# Patient Record
Sex: Female | Born: 1945 | Race: White | Hispanic: No | Smoking: Never smoker
Health system: Southern US, Community
[De-identification: ages and names within clinical notes are randomized; demographics above are authoritative.]

## PROBLEM LIST (undated history)

## (undated) DIAGNOSIS — Z974 Presence of external hearing-aid: Secondary | ICD-10-CM

## (undated) DIAGNOSIS — K2970 Gastritis, unspecified, without bleeding: Secondary | ICD-10-CM

## (undated) DIAGNOSIS — I639 Cerebral infarction, unspecified: Secondary | ICD-10-CM

## (undated) DIAGNOSIS — N183 Chronic kidney disease, stage 3 unspecified: Secondary | ICD-10-CM

## (undated) DIAGNOSIS — K219 Gastro-esophageal reflux disease without esophagitis: Secondary | ICD-10-CM

## (undated) DIAGNOSIS — L4050 Arthropathic psoriasis, unspecified: Secondary | ICD-10-CM

## (undated) DIAGNOSIS — M797 Fibromyalgia: Secondary | ICD-10-CM

## (undated) DIAGNOSIS — Z8489 Family history of other specified conditions: Secondary | ICD-10-CM

## (undated) DIAGNOSIS — Z800 Family history of malignant neoplasm of digestive organs: Secondary | ICD-10-CM

## (undated) DIAGNOSIS — R0600 Dyspnea, unspecified: Secondary | ICD-10-CM

## (undated) DIAGNOSIS — N870 Mild cervical dysplasia: Secondary | ICD-10-CM

## (undated) DIAGNOSIS — E039 Hypothyroidism, unspecified: Secondary | ICD-10-CM

## (undated) DIAGNOSIS — D131 Benign neoplasm of stomach: Secondary | ICD-10-CM

## (undated) DIAGNOSIS — K589 Irritable bowel syndrome without diarrhea: Secondary | ICD-10-CM

## (undated) DIAGNOSIS — G629 Polyneuropathy, unspecified: Secondary | ICD-10-CM

## (undated) DIAGNOSIS — G5600 Carpal tunnel syndrome, unspecified upper limb: Secondary | ICD-10-CM

## (undated) DIAGNOSIS — K2990 Gastroduodenitis, unspecified, without bleeding: Secondary | ICD-10-CM

## (undated) DIAGNOSIS — G4730 Sleep apnea, unspecified: Secondary | ICD-10-CM

## (undated) DIAGNOSIS — B3781 Candidal esophagitis: Secondary | ICD-10-CM

## (undated) DIAGNOSIS — K760 Fatty (change of) liver, not elsewhere classified: Secondary | ICD-10-CM

## (undated) DIAGNOSIS — N736 Female pelvic peritoneal adhesions (postinfective): Secondary | ICD-10-CM

## (undated) DIAGNOSIS — B009 Herpesviral infection, unspecified: Secondary | ICD-10-CM

## (undated) DIAGNOSIS — I10 Essential (primary) hypertension: Secondary | ICD-10-CM

## (undated) HISTORY — DX: Fatty (change of) liver, not elsewhere classified: K76.0

## (undated) HISTORY — DX: Carpal tunnel syndrome, unspecified upper limb: G56.00

## (undated) HISTORY — DX: Hemochromatosis, unspecified: E83.119

## (undated) HISTORY — DX: Irritable bowel syndrome, unspecified: K58.9

## (undated) HISTORY — DX: Polyneuropathy, unspecified: G62.9

## (undated) HISTORY — PX: APPENDECTOMY: SHX54

## (undated) HISTORY — PX: BACK SURGERY: SHX140

## (undated) HISTORY — PX: EYE SURGERY: SHX253

## (undated) HISTORY — DX: Fibromyalgia: M79.7

## (undated) HISTORY — PX: PELVIC LAPAROSCOPY: SHX162

## (undated) HISTORY — DX: Female pelvic peritoneal adhesions (postinfective): N73.6

## (undated) HISTORY — DX: Family history of malignant neoplasm of digestive organs: Z80.0

## (undated) HISTORY — DX: Sleep apnea, unspecified: G47.30

## (undated) HISTORY — PX: OTHER SURGICAL HISTORY: SHX169

## (undated) HISTORY — DX: Benign neoplasm of stomach: D13.1

## (undated) HISTORY — DX: Gastritis, unspecified, without bleeding: K29.70

## (undated) HISTORY — PX: FOOT SURGERY: SHX648

## (undated) HISTORY — PX: TUBAL LIGATION: SHX77

## (undated) HISTORY — DX: Essential (primary) hypertension: I10

## (undated) HISTORY — DX: Candidal esophagitis: B37.81

## (undated) HISTORY — DX: Hypothyroidism, unspecified: E03.9

## (undated) HISTORY — PX: CERVICAL FUSION: SHX112

## (undated) HISTORY — DX: Arthropathic psoriasis, unspecified: L40.50

## (undated) HISTORY — DX: Herpesviral infection, unspecified: B00.9

## (undated) HISTORY — DX: Gastroduodenitis, unspecified, without bleeding: K29.90

## (undated) HISTORY — DX: Mild cervical dysplasia: N87.0

## (undated) HISTORY — DX: Gastro-esophageal reflux disease without esophagitis: K21.9

---

## 1996-05-22 DIAGNOSIS — D320 Benign neoplasm of cerebral meninges: Secondary | ICD-10-CM | POA: Insufficient documentation

## 1998-01-27 ENCOUNTER — Other Ambulatory Visit: Admission: RE | Admit: 1998-01-27 | Discharge: 1998-01-27 | Payer: Self-pay | Admitting: Obstetrics and Gynecology

## 1998-07-18 ENCOUNTER — Other Ambulatory Visit: Admission: RE | Admit: 1998-07-18 | Discharge: 1998-07-18 | Payer: Self-pay | Admitting: Obstetrics and Gynecology

## 1998-11-20 ENCOUNTER — Ambulatory Visit (HOSPITAL_COMMUNITY): Admission: RE | Admit: 1998-11-20 | Discharge: 1998-11-20 | Payer: Self-pay | Admitting: Hematology & Oncology

## 1998-11-20 ENCOUNTER — Encounter: Payer: Self-pay | Admitting: Hematology & Oncology

## 1999-05-28 ENCOUNTER — Other Ambulatory Visit: Admission: RE | Admit: 1999-05-28 | Discharge: 1999-05-28 | Payer: Self-pay | Admitting: Obstetrics and Gynecology

## 1999-11-23 ENCOUNTER — Encounter: Payer: Self-pay | Admitting: Oncology

## 1999-11-23 ENCOUNTER — Encounter: Admission: RE | Admit: 1999-11-23 | Discharge: 1999-11-23 | Payer: Self-pay | Admitting: Oncology

## 2000-06-23 ENCOUNTER — Encounter: Admission: RE | Admit: 2000-06-23 | Discharge: 2000-06-23 | Payer: Self-pay | Admitting: Obstetrics and Gynecology

## 2000-06-23 ENCOUNTER — Encounter: Payer: Self-pay | Admitting: Obstetrics and Gynecology

## 2000-07-06 ENCOUNTER — Other Ambulatory Visit: Admission: RE | Admit: 2000-07-06 | Discharge: 2000-07-06 | Payer: Self-pay | Admitting: Obstetrics and Gynecology

## 2001-06-29 ENCOUNTER — Encounter: Payer: Self-pay | Admitting: Obstetrics and Gynecology

## 2001-06-29 ENCOUNTER — Encounter: Admission: RE | Admit: 2001-06-29 | Discharge: 2001-06-29 | Payer: Self-pay | Admitting: Obstetrics and Gynecology

## 2001-08-09 ENCOUNTER — Other Ambulatory Visit: Admission: RE | Admit: 2001-08-09 | Discharge: 2001-08-09 | Payer: Self-pay | Admitting: Obstetrics and Gynecology

## 2001-10-26 ENCOUNTER — Encounter (INDEPENDENT_AMBULATORY_CARE_PROVIDER_SITE_OTHER): Payer: Self-pay

## 2001-10-26 ENCOUNTER — Ambulatory Visit (HOSPITAL_COMMUNITY): Admission: RE | Admit: 2001-10-26 | Discharge: 2001-10-26 | Payer: Self-pay | Admitting: Obstetrics and Gynecology

## 2002-01-10 ENCOUNTER — Encounter: Admission: RE | Admit: 2002-01-10 | Discharge: 2002-02-09 | Payer: Self-pay | Admitting: Neurology

## 2002-07-24 ENCOUNTER — Encounter: Payer: Self-pay | Admitting: Obstetrics and Gynecology

## 2002-07-24 ENCOUNTER — Encounter: Admission: RE | Admit: 2002-07-24 | Discharge: 2002-07-24 | Payer: Self-pay | Admitting: Obstetrics and Gynecology

## 2002-08-09 ENCOUNTER — Other Ambulatory Visit: Admission: RE | Admit: 2002-08-09 | Discharge: 2002-08-09 | Payer: Self-pay | Admitting: Obstetrics and Gynecology

## 2002-12-31 ENCOUNTER — Encounter: Payer: Self-pay | Admitting: Gastroenterology

## 2003-07-26 ENCOUNTER — Encounter: Admission: RE | Admit: 2003-07-26 | Discharge: 2003-07-26 | Payer: Self-pay | Admitting: Obstetrics and Gynecology

## 2003-07-26 ENCOUNTER — Encounter: Payer: Self-pay | Admitting: Obstetrics and Gynecology

## 2003-09-03 ENCOUNTER — Other Ambulatory Visit: Admission: RE | Admit: 2003-09-03 | Discharge: 2003-09-03 | Payer: Self-pay | Admitting: Obstetrics and Gynecology

## 2003-11-11 ENCOUNTER — Encounter: Admission: RE | Admit: 2003-11-11 | Discharge: 2004-01-23 | Payer: Self-pay | Admitting: Neurology

## 2004-07-27 ENCOUNTER — Encounter: Admission: RE | Admit: 2004-07-27 | Discharge: 2004-07-27 | Payer: Self-pay | Admitting: Internal Medicine

## 2004-08-20 ENCOUNTER — Ambulatory Visit: Payer: Self-pay | Admitting: Hematology & Oncology

## 2004-09-09 ENCOUNTER — Other Ambulatory Visit: Admission: RE | Admit: 2004-09-09 | Discharge: 2004-09-09 | Payer: Self-pay | Admitting: Obstetrics and Gynecology

## 2004-10-21 ENCOUNTER — Ambulatory Visit: Payer: Self-pay | Admitting: Hematology & Oncology

## 2004-12-16 ENCOUNTER — Ambulatory Visit: Payer: Self-pay | Admitting: Hematology & Oncology

## 2005-04-12 ENCOUNTER — Ambulatory Visit: Payer: Self-pay | Admitting: Hematology & Oncology

## 2005-07-09 ENCOUNTER — Ambulatory Visit: Payer: Self-pay | Admitting: Hematology & Oncology

## 2005-07-28 ENCOUNTER — Encounter: Admission: RE | Admit: 2005-07-28 | Discharge: 2005-07-28 | Payer: Self-pay | Admitting: Obstetrics and Gynecology

## 2005-08-10 ENCOUNTER — Encounter: Admission: RE | Admit: 2005-08-10 | Discharge: 2005-08-10 | Payer: Self-pay | Admitting: Obstetrics and Gynecology

## 2005-09-10 ENCOUNTER — Other Ambulatory Visit: Admission: RE | Admit: 2005-09-10 | Discharge: 2005-09-10 | Payer: Self-pay | Admitting: Obstetrics and Gynecology

## 2005-10-08 ENCOUNTER — Ambulatory Visit: Payer: Self-pay | Admitting: Hematology & Oncology

## 2006-02-03 ENCOUNTER — Ambulatory Visit: Payer: Self-pay | Admitting: Hematology & Oncology

## 2006-02-07 LAB — CBC WITH DIFFERENTIAL/PLATELET
Basophils Absolute: 0 10*3/uL (ref 0.0–0.1)
Eosinophils Absolute: 0.1 10*3/uL (ref 0.0–0.5)
HGB: 14.5 g/dL (ref 11.6–15.9)
MCV: 100.2 fL (ref 81.0–101.0)
MONO#: 0.9 10*3/uL (ref 0.1–0.9)
NEUT#: 4.8 10*3/uL (ref 1.5–6.5)
Platelets: 382 10*3/uL (ref 145–400)
RBC: 4.2 10*6/uL (ref 3.70–5.32)
RDW: 12.8 % (ref 11.3–14.5)
WBC: 8.4 10*3/uL (ref 3.9–10.0)

## 2006-02-07 LAB — FERRITIN: Ferritin: 22 ng/mL (ref 10–291)

## 2006-05-02 ENCOUNTER — Ambulatory Visit (HOSPITAL_COMMUNITY): Admission: RE | Admit: 2006-05-02 | Discharge: 2006-05-02 | Payer: Self-pay | Admitting: Neurological Surgery

## 2006-05-18 ENCOUNTER — Ambulatory Visit (HOSPITAL_COMMUNITY): Admission: RE | Admit: 2006-05-18 | Discharge: 2006-05-18 | Payer: Self-pay | Admitting: Neurological Surgery

## 2006-06-03 ENCOUNTER — Ambulatory Visit: Payer: Self-pay | Admitting: Hematology & Oncology

## 2006-06-08 LAB — CBC WITH DIFFERENTIAL/PLATELET
Basophils Absolute: 0 10*3/uL (ref 0.0–0.1)
Eosinophils Absolute: 0.1 10*3/uL (ref 0.0–0.5)
HCT: 41.2 % (ref 34.8–46.6)
HGB: 14.5 g/dL (ref 11.6–15.9)
LYMPH%: 27.9 % (ref 14.0–48.0)
MCV: 98.1 fL (ref 81.0–101.0)
MONO%: 8.3 % (ref 0.0–13.0)
NEUT#: 3.5 10*3/uL (ref 1.5–6.5)
NEUT%: 61 % (ref 39.6–76.8)
Platelets: 322 10*3/uL (ref 145–400)
RDW: 12.5 % (ref 11.3–14.5)

## 2006-06-08 LAB — IRON AND TIBC
%SAT: 39 % (ref 20–55)
Iron: 131 ug/dL (ref 42–145)

## 2006-09-21 ENCOUNTER — Encounter: Admission: RE | Admit: 2006-09-21 | Discharge: 2006-09-21 | Payer: Self-pay | Admitting: Obstetrics and Gynecology

## 2006-09-29 ENCOUNTER — Ambulatory Visit: Payer: Self-pay | Admitting: Hematology & Oncology

## 2006-09-29 ENCOUNTER — Other Ambulatory Visit: Admission: RE | Admit: 2006-09-29 | Discharge: 2006-09-29 | Payer: Self-pay | Admitting: Obstetrics and Gynecology

## 2007-09-25 ENCOUNTER — Encounter: Admission: RE | Admit: 2007-09-25 | Discharge: 2007-09-25 | Payer: Self-pay | Admitting: Obstetrics and Gynecology

## 2007-10-03 ENCOUNTER — Other Ambulatory Visit: Admission: RE | Admit: 2007-10-03 | Discharge: 2007-10-03 | Payer: Self-pay | Admitting: Obstetrics and Gynecology

## 2007-11-08 ENCOUNTER — Ambulatory Visit: Payer: Self-pay | Admitting: Hematology & Oncology

## 2007-11-10 LAB — CBC WITH DIFFERENTIAL/PLATELET
Basophils Absolute: 0 10*3/uL (ref 0.0–0.1)
Eosinophils Absolute: 0.4 10*3/uL (ref 0.0–0.5)
HCT: 41.4 % (ref 34.8–46.6)
HGB: 14 g/dL (ref 11.6–15.9)
LYMPH%: 22.5 % (ref 14.0–48.0)
MONO#: 0.6 10*3/uL (ref 0.1–0.9)
NEUT%: 60.5 % (ref 39.6–76.8)
Platelets: 315 10*3/uL (ref 145–400)
WBC: 6.4 10*3/uL (ref 3.9–10.0)
lymph#: 1.4 10*3/uL (ref 0.9–3.3)

## 2008-01-19 DIAGNOSIS — K3184 Gastroparesis: Secondary | ICD-10-CM

## 2008-01-19 DIAGNOSIS — F329 Major depressive disorder, single episode, unspecified: Secondary | ICD-10-CM

## 2008-01-23 ENCOUNTER — Ambulatory Visit: Payer: Self-pay | Admitting: Gastroenterology

## 2008-01-24 ENCOUNTER — Ambulatory Visit: Payer: Self-pay | Admitting: Gastroenterology

## 2008-01-24 ENCOUNTER — Encounter: Payer: Self-pay | Admitting: Gastroenterology

## 2008-01-24 DIAGNOSIS — K2970 Gastritis, unspecified, without bleeding: Secondary | ICD-10-CM | POA: Insufficient documentation

## 2008-01-24 DIAGNOSIS — K2990 Gastroduodenitis, unspecified, without bleeding: Secondary | ICD-10-CM

## 2008-01-24 DIAGNOSIS — K589 Irritable bowel syndrome without diarrhea: Secondary | ICD-10-CM | POA: Insufficient documentation

## 2008-02-07 ENCOUNTER — Ambulatory Visit: Payer: Self-pay | Admitting: Hematology & Oncology

## 2008-02-22 DIAGNOSIS — N3010 Interstitial cystitis (chronic) without hematuria: Secondary | ICD-10-CM

## 2008-02-22 DIAGNOSIS — R198 Other specified symptoms and signs involving the digestive system and abdomen: Secondary | ICD-10-CM

## 2008-02-22 DIAGNOSIS — G609 Hereditary and idiopathic neuropathy, unspecified: Secondary | ICD-10-CM | POA: Insufficient documentation

## 2008-02-22 DIAGNOSIS — I10 Essential (primary) hypertension: Secondary | ICD-10-CM | POA: Insufficient documentation

## 2008-02-22 DIAGNOSIS — G4730 Sleep apnea, unspecified: Secondary | ICD-10-CM | POA: Insufficient documentation

## 2008-02-22 DIAGNOSIS — M5030 Other cervical disc degeneration, unspecified cervical region: Secondary | ICD-10-CM

## 2008-02-22 DIAGNOSIS — M5126 Other intervertebral disc displacement, lumbar region: Secondary | ICD-10-CM

## 2008-02-22 DIAGNOSIS — M1990 Unspecified osteoarthritis, unspecified site: Secondary | ICD-10-CM | POA: Insufficient documentation

## 2008-02-22 DIAGNOSIS — K219 Gastro-esophageal reflux disease without esophagitis: Secondary | ICD-10-CM

## 2008-02-22 DIAGNOSIS — IMO0001 Reserved for inherently not codable concepts without codable children: Secondary | ICD-10-CM

## 2008-02-22 DIAGNOSIS — N281 Cyst of kidney, acquired: Secondary | ICD-10-CM | POA: Insufficient documentation

## 2008-02-22 DIAGNOSIS — K7689 Other specified diseases of liver: Secondary | ICD-10-CM | POA: Insufficient documentation

## 2008-02-22 DIAGNOSIS — N390 Urinary tract infection, site not specified: Secondary | ICD-10-CM

## 2008-02-27 ENCOUNTER — Ambulatory Visit: Payer: Self-pay | Admitting: Gastroenterology

## 2008-03-06 ENCOUNTER — Telehealth: Payer: Self-pay | Admitting: Gastroenterology

## 2008-03-07 ENCOUNTER — Encounter: Payer: Self-pay | Admitting: Gastroenterology

## 2008-05-30 ENCOUNTER — Telehealth: Payer: Self-pay | Admitting: Gastroenterology

## 2008-09-25 ENCOUNTER — Encounter: Admission: RE | Admit: 2008-09-25 | Discharge: 2008-09-25 | Payer: Self-pay | Admitting: Obstetrics and Gynecology

## 2008-10-03 ENCOUNTER — Ambulatory Visit: Payer: Self-pay | Admitting: Obstetrics and Gynecology

## 2008-10-03 ENCOUNTER — Encounter: Payer: Self-pay | Admitting: Obstetrics and Gynecology

## 2008-10-03 ENCOUNTER — Other Ambulatory Visit: Admission: RE | Admit: 2008-10-03 | Discharge: 2008-10-03 | Payer: Self-pay | Admitting: Obstetrics and Gynecology

## 2009-07-09 ENCOUNTER — Telehealth (INDEPENDENT_AMBULATORY_CARE_PROVIDER_SITE_OTHER): Payer: Self-pay | Admitting: *Deleted

## 2009-10-04 DIAGNOSIS — B3781 Candidal esophagitis: Secondary | ICD-10-CM

## 2009-10-04 HISTORY — DX: Candidal esophagitis: B37.81

## 2009-10-15 ENCOUNTER — Encounter: Admission: RE | Admit: 2009-10-15 | Discharge: 2009-10-15 | Payer: Self-pay | Admitting: Obstetrics and Gynecology

## 2009-10-22 ENCOUNTER — Other Ambulatory Visit: Admission: RE | Admit: 2009-10-22 | Discharge: 2009-10-22 | Payer: Self-pay | Admitting: Obstetrics and Gynecology

## 2009-10-22 ENCOUNTER — Ambulatory Visit: Payer: Self-pay | Admitting: Obstetrics and Gynecology

## 2010-01-09 ENCOUNTER — Ambulatory Visit: Payer: Self-pay | Admitting: Vascular Surgery

## 2010-03-13 ENCOUNTER — Ambulatory Visit: Payer: Self-pay | Admitting: Obstetrics and Gynecology

## 2010-04-30 ENCOUNTER — Ambulatory Visit: Payer: Self-pay | Admitting: Obstetrics and Gynecology

## 2010-06-04 ENCOUNTER — Telehealth: Payer: Self-pay | Admitting: Gastroenterology

## 2010-06-23 ENCOUNTER — Ambulatory Visit: Payer: Self-pay | Admitting: Gastroenterology

## 2010-06-23 ENCOUNTER — Ambulatory Visit: Payer: Self-pay | Admitting: Obstetrics and Gynecology

## 2010-06-23 DIAGNOSIS — R197 Diarrhea, unspecified: Secondary | ICD-10-CM | POA: Insufficient documentation

## 2010-06-23 DIAGNOSIS — R160 Hepatomegaly, not elsewhere classified: Secondary | ICD-10-CM | POA: Insufficient documentation

## 2010-06-23 LAB — CONVERTED CEMR LAB
Albumin ELP: 57.8 % (ref 55.8–66.1)
Alpha-1-Globulin: 5.5 % — ABNORMAL HIGH (ref 2.9–4.9)
Alpha-2-Globulin: 14 % — ABNORMAL HIGH (ref 7.1–11.8)
Beta Globulin: 6.5 % (ref 4.7–7.2)
Gamma Globulin: 13.1 % (ref 11.1–18.8)
Total Protein, Serum Electrophoresis: 6.6 g/dL (ref 6.0–8.3)

## 2010-06-29 LAB — CONVERTED CEMR LAB
ALT: 34 units/L (ref 0–35)
AST: 48 units/L — ABNORMAL HIGH (ref 0–37)
Albumin: 3.8 g/dL (ref 3.5–5.2)
Alkaline Phosphatase: 88 units/L (ref 39–117)
BUN: 21 mg/dL (ref 6–23)
Basophils Relative: 0.3 % (ref 0.0–3.0)
Bilirubin, Direct: 0.2 mg/dL (ref 0.0–0.3)
CO2: 27 meq/L (ref 19–32)
Calcium: 9.5 mg/dL (ref 8.4–10.5)
Chloride: 99 meq/L (ref 96–112)
Creatinine, Ser: 1 mg/dL (ref 0.4–1.2)
Eosinophils Relative: 3.6 % (ref 0.0–5.0)
Ferritin: 40 ng/mL (ref 10.0–291.0)
Folate: >20 ng/mL
GFR calc non Af Amer: 58.07 mL/min (ref >60)
Glucose, Bld: 328 mg/dL — ABNORMAL HIGH (ref 70–99)
HCT: 40.8 % (ref 36.0–46.0)
Hemoglobin: 13.9 g/dL (ref 12.0–15.0)
Iron: 82 ug/dL (ref 42–145)
Lymphocytes Relative: 22.1 % (ref 12.0–46.0)
MCHC: 34.2 g/dL (ref 30.0–36.0)
MCV: 100.8 fL — ABNORMAL HIGH (ref 78.0–100.0)
Monocytes Relative: 9.3 % (ref 3.0–12.0)
Neutrophils Relative %: 64.7 % (ref 43.0–77.0)
Platelets: 279 10*3/uL (ref 150.0–400.0)
Potassium: 4 meq/L (ref 3.5–5.1)
RBC: 4.04 M/uL (ref 3.87–5.11)
RDW: 12.3 % (ref 11.5–14.6)
Saturation Ratios: 24 % (ref 20.0–50.0)
Sed Rate: 29 mm/hr — ABNORMAL HIGH (ref 0–22)
Sodium: 140 meq/L (ref 135–145)
TSH: 3.11 microintl units/mL (ref 0.35–5.50)
Total Bilirubin: 0.9 mg/dL (ref 0.3–1.2)
Total Protein: 6.6 g/dL (ref 6.0–8.3)
Transferrin: 243.7 mg/dL (ref 212.0–360.0)
Vitamin B-12: 364 pg/mL (ref 211–911)
WBC: 5 10*3/uL (ref 4.5–10.5)

## 2010-09-04 ENCOUNTER — Telehealth: Payer: Self-pay | Admitting: Gastroenterology

## 2010-09-08 ENCOUNTER — Ambulatory Visit: Payer: Self-pay | Admitting: Gastroenterology

## 2010-09-08 ENCOUNTER — Telehealth: Payer: Self-pay | Admitting: Gastroenterology

## 2010-09-08 ENCOUNTER — Encounter (INDEPENDENT_AMBULATORY_CARE_PROVIDER_SITE_OTHER): Payer: Self-pay | Admitting: *Deleted

## 2010-09-08 DIAGNOSIS — R110 Nausea: Secondary | ICD-10-CM

## 2010-09-08 DIAGNOSIS — R1013 Epigastric pain: Secondary | ICD-10-CM

## 2010-09-08 LAB — CONVERTED CEMR LAB
ALT: 30 units/L (ref 0–35)
AST: 39 units/L — ABNORMAL HIGH (ref 0–37)
Albumin: 4.1 g/dL (ref 3.5–5.2)
Alkaline Phosphatase: 110 units/L (ref 39–117)
Amylase: 24 units/L — ABNORMAL LOW (ref 27–131)
Bilirubin, Direct: 0.2 mg/dL (ref 0.0–0.3)
CRP, High Sensitivity: 5.14 — ABNORMAL HIGH (ref 0.00–5.00)
Lipase: 19 units/L (ref 11.0–59.0)
Sed Rate: 16 mm/hr (ref 0–22)
Total Bilirubin: 1.4 mg/dL — ABNORMAL HIGH (ref 0.3–1.2)
Total Protein: 7.1 g/dL (ref 6.0–8.3)

## 2010-09-09 ENCOUNTER — Ambulatory Visit: Payer: Self-pay | Admitting: Gastroenterology

## 2010-09-11 ENCOUNTER — Encounter: Payer: Self-pay | Admitting: Gastroenterology

## 2010-09-25 ENCOUNTER — Ambulatory Visit: Payer: Self-pay | Admitting: Gastroenterology

## 2010-09-25 ENCOUNTER — Encounter: Payer: Self-pay | Admitting: Gastroenterology

## 2010-09-25 DIAGNOSIS — R7309 Other abnormal glucose: Secondary | ICD-10-CM | POA: Insufficient documentation

## 2010-09-25 LAB — CONVERTED CEMR LAB
ALT: 47 units/L — ABNORMAL HIGH (ref 0–35)
AST: 57 units/L — ABNORMAL HIGH (ref 0–37)
Albumin: 4 g/dL (ref 3.5–5.2)
Alkaline Phosphatase: 85 units/L (ref 39–117)
Amylase: 26 units/L — ABNORMAL LOW (ref 27–131)
BUN: 12 mg/dL (ref 6–23)
Basophils Absolute: 0 10*3/uL (ref 0.0–0.1)
Basophils Relative: 0.6 % (ref 0.0–3.0)
Bilirubin, Direct: 0.1 mg/dL (ref 0.0–0.3)
CO2: 29 meq/L (ref 19–32)
Calcium: 9.8 mg/dL (ref 8.4–10.5)
Chloride: 100 meq/L (ref 96–112)
Creatinine, Ser: 0.9 mg/dL (ref 0.4–1.2)
Eosinophils Absolute: 0.2 10*3/uL (ref 0.0–0.7)
Eosinophils Relative: 3.6 % (ref 0.0–5.0)
Ferritin: 30.3 ng/mL (ref 10.0–291.0)
Folate: >20 ng/mL
GFR calc non Af Amer: 64.55 mL/min (ref >60.00)
Glucose, Bld: 172 mg/dL — ABNORMAL HIGH (ref 70–99)
HCT: 41.5 % (ref 36.0–46.0)
Hemoglobin: 14.3 g/dL (ref 12.0–15.0)
Hgb A1c MFr Bld: 8.3 % — ABNORMAL HIGH (ref 4.6–6.5)
Iron: 96 ug/dL (ref 42–145)
Lipase: 20 units/L (ref 11.0–59.0)
Lymphocytes Relative: 25 % (ref 12.0–46.0)
Lymphs Abs: 1.5 10*3/uL (ref 0.7–4.0)
MCHC: 34.5 g/dL (ref 30.0–36.0)
MCV: 98.2 fL (ref 78.0–100.0)
Magnesium: 1.9 mg/dL (ref 1.5–2.5)
Monocytes Absolute: 0.6 10*3/uL (ref 0.1–1.0)
Monocytes Relative: 9.2 % (ref 3.0–12.0)
Neutro Abs: 3.7 10*3/uL (ref 1.4–7.7)
Neutrophils Relative %: 61.6 % (ref 43.0–77.0)
Platelets: 319 10*3/uL (ref 150.0–400.0)
Potassium: 3.9 meq/L (ref 3.5–5.1)
RBC: 4.23 M/uL (ref 3.87–5.11)
RDW: 12.8 % (ref 11.5–14.6)
Saturation Ratios: 24.9 % (ref 20.0–50.0)
Sodium: 139 meq/L (ref 135–145)
TSH: 3.15 microintl units/mL (ref 0.35–5.50)
Total Bilirubin: 1 mg/dL (ref 0.3–1.2)
Total Protein: 6.5 g/dL (ref 6.0–8.3)
Transferrin: 275 mg/dL (ref 212.0–360.0)
Vit D, 25-Hydroxy: 41 ng/mL (ref 30–89)
Vitamin B-12: 467 pg/mL (ref 211–911)
WBC: 6 10*3/uL (ref 4.5–10.5)

## 2010-10-06 ENCOUNTER — Telehealth: Payer: Self-pay | Admitting: Gastroenterology

## 2010-10-19 ENCOUNTER — Encounter
Admission: RE | Admit: 2010-10-19 | Discharge: 2010-10-19 | Payer: Self-pay | Source: Home / Self Care | Attending: Obstetrics and Gynecology | Admitting: Obstetrics and Gynecology

## 2010-10-24 ENCOUNTER — Encounter: Payer: Self-pay | Admitting: Obstetrics and Gynecology

## 2010-10-27 ENCOUNTER — Ambulatory Visit
Admission: RE | Admit: 2010-10-27 | Discharge: 2010-10-27 | Payer: Self-pay | Source: Home / Self Care | Attending: Obstetrics and Gynecology | Admitting: Obstetrics and Gynecology

## 2010-11-03 NOTE — Letter (Signed)
Summary: EGD Instructions  [facility]  [address] [place], [place] [address]   Phone: [phone]  Fax: [phone]       [name]    Feb 05, 1946    MRN: [id number]       Procedure Day Dorna Bloom:  Wednesday 09/09/2010     Arrival Time: 8am     Procedure Time:9am     Location of Procedure:                    [facility] (4th Floor)    PREPARATION FOR ENDOSCOPY   On 09/09/2010 THE DAY OF THE PROCEDURE:  1.   No solid foods, milk or milk products are allowed after midnight the night before your procedure.  2.   Do not drink anything colored red or purple.  Avoid juices with pulp.  No orange juice.  3.  You may drink clear liquids until 7am, which is 2 hours before your procedure.                                                                                                CLEAR LIQUIDS INCLUDE: Water Jello Ice Popsicles Tea (sugar ok, no milk/cream) Powdered fruit flavored drinks Coffee (sugar ok, no milk/cream) Gatorade Juice: apple, white grape, white cranberry  Lemonade Clear bullion, consomm, broth Carbonated beverages (any kind) Strained chicken noodle soup Hard Candy   MEDICATION INSTRUCTIONS  Unless otherwise instructed, you should take regular prescription medications with a small sip of water as early as possible the morning of your procedure.                OTHER INSTRUCTIONS  You will need a responsible adult at least 64 years of age to accompany you and drive you home.   This person must remain in the waiting room during your procedure.  Wear loose fitting clothing that is easily removed.  Leave jewelry and other valuables at home.  However, you may wish to bring a book to read or an iPod/MP3 player to listen to music as you wait for your procedure to start.  Remove all body piercing jewelry and leave at home.  Total time from sign-in until discharge is approximately 2-3 hours.  You should go home  directly after your procedure and rest.  You can resume normal activities the day after your procedure.  The day of your procedure you should not:   Drive   Make legal decisions   Operate machinery   Drink alcohol   Return to work  You will receive specific instructions about eating, activities and medications before you leave.    The above instructions have been reviewed and explained to me by   _______________________    I fully understand and can verbalize these instructions _____________________________ Date _________

## 2010-11-03 NOTE — Progress Notes (Signed)
Summary: Doesnt feel any better  Phone Note Call from Patient Call back at [facility] Phone [phone]   Call For: Dr Jarold Motto Reason for Call: Talk to Nurse Summary of Call: Had medicines called in on friday but she doesnt seem to feel any better. Initial call taken by: Leanor Kail [facility],  September 08, 2010 9:05 AM  Follow-up for Phone Call        Spoke with patient, she states she is no better since starting the zofran and librax on Friday. She is still having nausea, diarrhea, and abdominal pain. Appt made with Dr. Jarold Motto for 09/08/10 at 2:30pm. Follow-up by: Selinda Michaels RN,  September 08, 2010 9:53 AM

## 2010-11-03 NOTE — Progress Notes (Signed)
Summary: Triage  Phone Note Call from Patient Call back at Home Phone [phone]   Caller: Patient Call For: Dr.Mayford Alberg Reason for Call: Talk to Nurse Summary of Call: pt is complaining of nausea, no vomiting, having stomach pain and stomach is tender to the touch, has had a lot of GI problems in the past and wants to speak with the nurse Initial call taken by: [place] Johnson,  September 04, 2010 12:43 PM  Follow-up for Phone Call        Patient calling to report nausea without vomiting since Tuesday. Diarrhea twice yesterday but none today. Stomach "sore to touch all over above and below navel." Stomach hurts when walking. Denies fever. Cannot eat only had toast this AM. Drinking water. Taking Nexium two times a day on Wednesday and Thursday. Does not having anything  to take for nausea. Spoke with Dr. Jarold Motto re: patient. Per Dr. Jarold Motto Zofran 4 mg tab one by mouth every 6-8 hours as needed. Librax 2.5 mg by mouth three times a day before meals. Patient notified. Rx sent to patient's pharmacy. Follow-up by: Jesse Fall RN,  September 04, 2010 3:38 PM    New/Updated Medications: ZOFRAN 4 MG TABS (ONDANSETRON HCL) Take one tablet by mouth every 6-8 hours as needed nausea LIBRAX 2.5-5 MG CAPS (CLIDINIUM-CHLORDIAZEPOXIDE) Take one by mouth three times a day before meals Prescriptions: LIBRAX 2.5-5 MG CAPS (CLIDINIUM-CHLORDIAZEPOXIDE) Take one by mouth three times a day before meals  #90 x 1   Entered by:   Jesse Fall RN   Authorized by:   Mardella Layman MD [facility]   Signed by:   Jesse Fall RN on 09/04/2010   Method used:   Electronically to        [facility]  [address] [phone][facility] (retail)       [address]       [place], [place]  [address]       Ph: [phone] or [phone]       Fax: [phone]   RxID:   [id number] ZOFRAN 4 MG TABS (ONDANSETRON HCL) Take one tablet by mouth every 6-8 hours as needed nausea  #20 x 0   Entered by:   Jesse Fall RN  Authorized by:   Mardella Layman MD [facility]   Signed by:   Jesse Fall RN on 09/04/2010   Method used:   Electronically to        [facility]  [address] [phone]* (retail)       [address]       [place], [place]  [address]       Ph: [phone] or [phone]       Fax: [phone]   RxID:   [id number]

## 2010-11-03 NOTE — Progress Notes (Signed)
Summary: Refill and Appt  Phone Note From Pharmacy   Summary of Call: Refill requested for Nexium 40 mg .  Send to [facility].  Pt also needs appt.   Appt sch and pt notified. Initial call taken by: Ashok Cordia RN,  June 04, 2010 2:06 PM  Follow-up for Phone Call        Rx sent as requested Follow-up by: Ashok Cordia RN,  June 04, 2010 2:07 PM    Prescriptions: NEXIUM 40 MG  CPDR (ESOMEPRAZOLE MAGNESIUM) 1 capsule each day 30 minutes before meal  #90 x 3   Entered by:   Ashok Cordia RN   Authorized by:   Mardella Layman MD [facility]   Signed by:   Ashok Cordia RN on 06/04/2010   Method used:   Electronically to        [facility]* (mail-order)       [address] [place], [place]  [address]       Ph: [phone]       Fax: [phone]   RxID:   [id number]

## 2010-11-03 NOTE — Miscellaneous (Signed)
Summary: Diflucan  Clinical Lists Changes  Medications: Added new medication of DIFLUCAN 100 MG TABS (FLUCONAZOLE) take two tabs by mouth one day one and then one by mouth once daily for 2 weeks. - Signed Rx of DIFLUCAN 100 MG TABS (FLUCONAZOLE) take two tabs by mouth one day one and then one by mouth once daily for 2 weeks.;  #16 x 0;  Signed;  Entered by: Harlow Mares CMA (AAMA);  Authorized by: Mardella Layman MD [facility];  Method used: Electronically to [facility]  [address] [phone]*, [address] [place], [place], [place]  [address], Ph: [phone] or [phone], Fax: [phone]    Prescriptions: DIFLUCAN 100 MG TABS (FLUCONAZOLE) take two tabs by mouth one day one and then one by mouth once daily for 2 weeks.  #16 x 0   Entered by:   Harlow Mares CMA (AAMA)   Authorized by:   Mardella Layman MD [facility]   Signed by:   Harlow Mares CMA (AAMA) on 09/09/2010   Method used:   [facility]ronically to        [facility]  [address] [phone][facility] (retail)       [address]       [place], [place]  [address]       Ph: [phone] or [phone]       Fax: [phone]   RxID:   [id number]

## 2010-11-03 NOTE — Assessment & Plan Note (Signed)
Summary: Recheck/dfs   History of Present Illness Visit Type: Follow-up Visit Primary GI MD: Sheryn Bison MD FACP FAGA Primary Provider: Geoffry Paradise, M.D. Chief Complaint: Nexium refills History of Present Illness:   Extremely Complicated 65 year old white female with chronic diarrhea predominant IBS, chronic GERD, peripheral neuropathy, cervical spine disease, chronic pain syndrome, and supposedly hemochromatosis. She also has chronic anxiety syndrome which is managed by BuSpar and Cymbalta.  I have seen her for many years because of stable GERD treated with Nexium 40 mg a day. She's had extensive GI evaluations for her diarrhea which have been negative except for IBS and fatty liver. Her main complaints currently are peripheral neuropathy related to multiple neurosurgical problems including previous meningioma. She currently is on a variety of medications for pain control including Lyrica, Cymbalta, hydrocodone, Celebrex, and mlamotrigine.  She denies dysphasia, anorexia, weight loss, nausea and vomiting. She is not diabetic and denies chronic cardiovascular problems. Other diagnoses include fibromyalgia, family history colon cancer, previous hysterectomy and cholecystectomy.   GI Review of Systems      Denies abdominal pain, acid reflux, belching, bloating, chest pain, dysphagia with liquids, dysphagia with solids, heartburn, loss of appetite, nausea, vomiting, vomiting blood, weight loss, and  weight gain.      Reports diarrhea.     Denies anal fissure, black tarry stools, change in bowel habit, constipation, diverticulosis, fecal incontinence, heme positive stool, hemorrhoids, irritable bowel syndrome, jaundice, light color stool, liver problems, rectal bleeding, and  rectal pain. Preventive Screening-Counseling & Management      Drug Use:  no.      Current Medications (verified): 1)  Metoprolol-Hydrochlorothiazide 100-50 Mg  Tabs (Metoprolol-Hydrochlorothiazide) .... Once  Daily 2)  Cymbalta 30 Mg  Cpep (Duloxetine Hcl) .... Two Caps in Am and One Cap At At Bedtime 3)  Lyrica 225 Mg  Caps (Pregabalin) .... One Two Times A Day 4)  Valtrex 500 Mg  Tabs (Valacyclovir Hcl) .... Once Daily 5)  Caltrate 600+d 600-400 Mg-Unit  Tabs (Calcium Carbonate-Vitamin D) .... One Two Times A Day 6)  Multivitamins   Tabs (Multiple Vitamin) .... Once Daily 7)  Lastacaft 0.25 % Soln (Alcaftadine) .... Use As Directed 8)  Buspar 10 Mg  Tabs (Buspirone Hcl) .... Take Three Times A Day With Meals 9)  Nexium 40 Mg  Cpdr (Esomeprazole Magnesium) .Marland Kitchen.. 1 Capsule Each Day 30 Minutes Before Meal 10)  Hydrocodone-Acetaminophen 10-660 Mg Tabs (Hydrocodone-Acetaminophen) .... As Needed For Pain 11)  Celebrex 200 Mg Caps (Celecoxib) .... Take 1 Capsule By Mouth Two Times A Day 12)  Fexofenadine Hcl 180 Mg Tabs (Fexofenadine Hcl) .... Take 1 Tablet By Mouth Once Daily 13)  Furosemide 40 Mg Tabs (Furosemide) .... Take 1 Tablet By Mouth Once Daily 14)  Lamotrigine 100 Mg Tabs (Lamotrigine) .... Take 2 Tablets By Mouth Once Daily  Allergies (verified): No Known Drug Allergies  Past History:  Past medical, surgical, family and social histories (including risk factors) reviewed for relevance to current acute and chronic problems.  Past Medical History: Reviewed history from 02/22/2008 and no changes required. Current Problems:  UTI'S, RECURRENT (ICD-599.0) SLEEP APNEA (ICD-780.57) INTERSTITIAL CYSTITIS (ICD-595.1) HYPERTENSION (ICD-401.9) FIBROMYALGIA (ICD-729.1) DEGENERATIVE JOINT DISEASE (ICD-715.90) OSTEOPOROSIS (ICD-733.00) PERIPHERAL NEUROPATHY (ICD-356.9) HERNIATED LUMBOSACRAL DISC (ICD-722.10) DISC DISEASE, CERVICAL (ICD-722.4) GERD (ICD-530.81) RENAL CYST, LEFT (ICD-593.2) FATTY LIVER DISEASE (ICD-571.8) CARCINOMA, COLON, FAMILY HX (ICD-V16.0) EARLY SATIETY (ICD-789.9) HEMOCHROMATOSIS (ICD-275.0) GASTRITIS (ICD-535.50) IRRITABLE BOWEL SYNDROME (ICD-564.1) DEPRESSION  (ICD-311) GASTROPARESIS (ICD-536.3) MENINGIOMA (ICD-225.2)  Past Surgical History: Reviewed history from  02/22/2008 and no changes required. laminectomy c6-c7 resection of a meningioma Dr. Danielle Dess 05/23/1978 Cholecystectomy 1998 Hysterectomy metallic plates in neck Appendectomy cervical spine surgery X2  Family History: Reviewed history from 02/27/2008 and no changes required. Family History of Diabetes: sister Family History of Heart Disease: mother,grandmother,sister melanoma  sister Family History of Colon Cancer:mother  died of oral cancer Lung cancer-father died  Social History: Reviewed history from 02/27/2008 and no changes required. Patient has never smoked.  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  The patient denies anemia, anxiety-new, blood in urine, breast changes/lumps, confusion, cough, coughing up blood, fainting, fever, headaches-new, hearing problems, heart murmur, heart rhythm changes, itching, menstrual pain, nosebleeds, pregnancy symptoms, shortness of breath, skin rash, sore throat, swollen lymph glands, thirst - excessive , urination - excessive , urination changes/pain, urine leakage, vision changes, and voice change.    Vital Signs:  Patient profile:   65 year old female Height:      59 inches Weight:      209.25 pounds BMI:     42.42 Pulse rate:   64 / minute Pulse rhythm:   regular BP sitting:   104 / 68  (left arm) Cuff size:   large  Vitals Entered By: June McMurray CMA Duncan Dull) (June 23, 2010 3:26 PM)  Physical Exam  General:  Well developed, well nourished, no acute distress. Head:  Normocephalic and atraumatic. Eyes:  PERRLA, no icterus.exam deferred to patient's ophthalmologist.     Neck:  Supple; no masses or thyromegaly. Lungs:  Clear throughout to auscultation. Heart:  Regular rate and rhythm; no murmurs, rubs,  or bruits. Abdomen:  Somewhat enlarged liver with prominent left lobe and epigastric area. No definite splenomegaly, other abdominal masses or ascites noted. Rectal:  deferred. She is up-to-date on her colonoscopy exams. Msk:  arthritic changes.   Extremities:  No clubbing, cyanosis, edema or deformities noted.trace pedal edema.   Neurologic:  Alert and  oriented x4;  grossly normal neurologically. Cervical Nodes:  No significant cervical adenopathy. Psych:  Alert and cooperative. Normal mood and affect.   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 However, I suspect she is a heterozygote for hemochromatosis. I will repeat her liver enzymes, genetic markers for hemochromatosis, and other causes of metabolic liver disease. There is no evidence on exam of progressive hepatic failure.  Problem # 2:  DIARRHEA (ICD-787.91) Assessment: Unchanged Diarrhea predominant IBS with a family history of colon cancer. She denies excessive use of sorbitol, fructose, or any history of lactose intolerance. It is of note she is on chronic Nexium therapy. Orders: TLB-CBC Platelet - w/Differential (85025-CBCD) TLB-BMP (Basic Metabolic Panel-BMET) (80048-METABOL) TLB-Hepatic/Liver Function Pnl (80076-HEPATIC) TLB-TSH (Thyroid Stimulating Hormone) (84443-TSH) TLB-B12, Serum-Total ONLY (16109-U04) TLB-Ferritin (82728-FER) TLB-Folic Acid (Folate) (82746-FOL) TLB-IBC Pnl (Iron/FE;Transferrin) (83550-IBC) TLB-Sedimentation Rate (ESR) (85652-ESR) T-Hemochromatosis [phone]) T-Serum Protein Electrophoresis (62130-86578)  Problem # 3:  FIBROMYALGIA (ICD-729.1) Assessment: Unchanged continue multiple  medications per primary care. She does have a history of peripheral neuropathy of unexplained etiology. She is followed by Dr. Quentin Mulling in [facility] neurology.  Problem # 4:  GERD (ICD-530.81) Assessment: Improved continue anti-reflux regime and Nexium  40 mg a day. Cause of her chronic Nexium use I will order magnesium level and B12. Orders: TLB-CBC Platelet - w/Differential (85025-CBCD) TLB-BMP (Basic Metabolic Panel-BMET) (80048-METABOL) TLB-Hepatic/Liver Function Pnl (80076-HEPATIC) TLB-TSH (Thyroid Stimulating Hormone) (84443-TSH) TLB-B12, Serum-Total ONLY (09811-B14) TLB-Ferritin (82728-FER) TLB-Folic Acid (Folate) (82746-FOL) TLB-IBC Pnl (Iron/FE;Transferrin) (83550-IBC) TLB-Sedimentation Rate (ESR) (85652-ESR) T-Hemochromatosis [phone]) T-Serum Protein Electrophoresis ([id number])  Patient Instructions: 1)  Copy sent to : Geoffry Paradise, M.D. 2)  Please go to the basement today for your labs.  3)  Please continue current medications.  4)  Please schedule a follow-up appointment as needed.  5)  The medication list was reviewed and reconciled.  All changed / newly prescribed medications were explained.  A complete medication list was provided to the patient / caregiver. 6)  Avoid foods high in acid content ( tomatoes, citrus juices, spicy foods) . Avoid eating within 3 to 4 hours of lying down or before exercising. Do not over eat; try smaller more frequent meals. Elevate head of bed four inches when sleeping.  7)  Colonoscopy Followup As per Clinical Protocol

## 2010-11-03 NOTE — Assessment & Plan Note (Signed)
Summary: Nausea/abdominal pain/LRH   History of Present Illness Visit Type: Follow-up Visit Primary GI MD: Sheryn Bison MD FACP FAGA Primary Provider: Geoffry Paradise, MD Chief Complaint: Patient c/o 1week epigastric abdominal pain and severe nausea. She has also had diarrhea directly after eating. History of Present Illness:   Very Complicated 65 year old Caucasian female with multiple medical problems associated with severe cervical spondylitis with multiple surgical procedures to her neck and also removal of a meningioma. She has had previous cholecystectomy, hysterectomy, and appendectomy. She has a chronic abdominal pain syndrome related to GERD, IBS, and suspected intestinal adhesions.  The last 10 days she's had worsening epigastric abdominal pain with rather severe nausea refractory to Zofran and Librax. She is chronically on Nexium 40 mg a day. She repeatedly denies recent antibiotic exposure or prednisone therapy. There is no associated dysphagia or painful swallowing. Last endoscopic exam and colonoscopy was 2 years ago. She is on multiple medications including p.r.n. hydrocodone.  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 She has had previous phlebotomies but none in 4 years. There is some question as to whether or not she has hemochromatosis associated arthritis. Problems include fibromyalgia, interstitial cystitis, peripheral neuropathy, and chronic depression. She denies abuse of NSAIDs, cigarettes, or alcohol   GI Review of Systems    Reports abdominal pain, chest pain, loss of appetite, and  nausea.     Location of  Abdominal pain: epigastric area.    Denies acid reflux, belching, bloating, dysphagia with liquids, dysphagia with solids, heartburn, vomiting, vomiting blood, weight loss, and  weight gain.      Reports change in bowel habits and  diarrhea.     Denies anal fissure, black tarry stools,  constipation, diverticulosis, fecal incontinence, heme positive stool, hemorrhoids, irritable bowel syndrome, jaundice, light color stool, liver problems, rectal bleeding, and  rectal pain. Preventive Screening-Counseling & Management  Caffeine-Diet-Exercise     Does Patient Exercise: no    Current Medications (verified): 1)  Metoprolol-Hydrochlorothiazide 100-50 Mg  Tabs (Metoprolol-Hydrochlorothiazide) .... Take 1 Tablet By Mouth Once A Day 2)  Cymbalta 30 Mg  Cpep (Duloxetine Hcl) .... Two Caps in The Morning 3)  Valtrex 500 Mg  Tabs (Valacyclovir Hcl) .... Once Daily 4)  Caltrate 600+d 600-400 Mg-Unit  Tabs (Calcium Carbonate-Vitamin D) .... One Two Times A Day 5)  Multivitamins   Tabs (Multiple Vitamin) .... Once Daily 6)  Lastacaft 0.25 % Soln (Alcaftadine) .... Use As Directed 7)  Buspar 10 Mg  Tabs (Buspirone Hcl) .... Take Three Times A Day With Meals 8)  Nexium 40 Mg  Cpdr (Esomeprazole Magnesium) .Marland Kitchen.. 1 Capsule Each Day 30 Minutes Before Meal 9)  Hydrocodone-Acetaminophen 10-660 Mg Tabs (Hydrocodone-Acetaminophen) .... As Needed For Pain 10)  Celebrex 200 Mg Caps (Celecoxib) .... Take 1 Capsule By Mouth Two Times A Day 11)  Furosemide 40 Mg Tabs (Furosemide) .... Take 1 Tablet By Mouth Once Daily 12)  Lamotrigine 100 Mg Tabs (Lamotrigine) .... Take 2 Tablets By Mouth Once Daily 13)  Zofran 4 Mg Tabs (Ondansetron Hcl) .... Take One Tablet By Mouth Every 6-8 Hours As Needed Nausea 14)  Librax 2.5-5 Mg Caps (Clidinium-Chlordiazepoxide) .... Take One By Mouth Three Times A Day Before Meals (Patient Has Only Been Taking 1 Tab Daily)  Allergies (verified): No Known Drug Allergies  Past History:  Past medical, surgical, family and social histories (including risk factors) reviewed for relevance to current acute and chronic problems.  Past Medical History: Reviewed history from 02/22/2008 and no changes required. Current Problems:  UTI'S, RECURRENT (ICD-599.0) SLEEP APNEA  (ICD-780.57) INTERSTITIAL CYSTITIS (ICD-595.1) HYPERTENSION (ICD-401.9) FIBROMYALGIA (ICD-729.1) DEGENERATIVE JOINT DISEASE (ICD-715.90) OSTEOPOROSIS (ICD-733.00) PERIPHERAL NEUROPATHY (ICD-356.9) HERNIATED LUMBOSACRAL DISC (ICD-722.10) DISC DISEASE, CERVICAL (ICD-722.4) GERD (ICD-530.81) RENAL CYST, LEFT (ICD-593.2) FATTY LIVER DISEASE (ICD-571.8) CARCINOMA, COLON, FAMILY HX (ICD-V16.0) EARLY SATIETY (ICD-789.9) HEMOCHROMATOSIS (ICD-275.0) GASTRITIS (ICD-535.50) IRRITABLE BOWEL SYNDROME (ICD-564.1) DEPRESSION (ICD-311) GASTROPARESIS (ICD-536.3) MENINGIOMA (ICD-225.2)  Past Surgical History: laminectomy c6-c7 resection of a meningioma Dr. Danielle Dess 05/23/1978 Cholecystectomy 1998 Hysterectomy metallic plates in neck Appendectomy cervical spine surgery X2 Left foot surgery Sinus Surgery  Family History: Reviewed history from 02/27/2008 and no changes required. Family History of Diabetes: sister Family History of Heart Disease: mother,grandmother,sister melanoma  sister Family History of Colon Cancer:mother  Mother died of oral cancer Lung cancer-father died Family History of Kidney Disease: Sister Family History of Liver Disease: Sister Family History of Clotting disorder: Sister -hemochromatosis  Social History: Reviewed history from 06/23/2010 and no changes required. Patient has never smoked.  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  Does Patient Exercise:  no  Review of Systems       The patient complains of allergy/sinus, arthritis/joint pain, back pain, depression-new, fatigue, headaches-new, muscle pains/cramps, night sweats, nosebleeds, and thirst - excessive.  The patient denies anemia, anxiety-new, blood in urine, breast changes/lumps, change in vision, confusion, cough, coughing up blood, fainting, fever, hearing problems, heart murmur, heart rhythm changes, itching, menstrual pain, pregnancy  symptoms, shortness of breath, skin rash, sore throat, swelling of feet/legs, swollen lymph glands, thirst - excessive , urination - excessive , urination changes/pain, urine leakage, vision changes, and voice change.    Vital Signs:  Patient profile:   65 year old female Height:      68 inches Weight:      192.38 pounds BMI:     29.36 BSA:     2.01 Pulse rate:   72 / minute Pulse rhythm:   regular BP sitting:   124 / 74  (right arm)  Vitals Entered By: Lamona Curl CMA Duncan Dull) (September 08, 2010 2:31 PM)  Physical Exam  General:  Well developed, well nourished, no acute distress.Chronically Head:  Normocephalic and atraumatic. Eyes:  PERRLA, no icterus.exam deferred to patient's ophthalmologist.   Neck:  Supple; no masses or thyromegaly.Stiff neck related to previous surgical procedures. Lungs:  Clear throughout to auscultation. Heart:  Regular rate and rhythm; no murmurs, rubs,  or bruits. Abdomen:  Soft, nontender and nondistended. No masses, hepatosplenomegaly or hernias noted. Normal bowel sounds. Rectal:  Normal exam.hemocult negative.   Extremities:  No clubbing, cyanosis, edema or deformities noted. Neurologic:  Alert and  oriented x4;  grossly normal neurologically. Cervical Nodes:  No significant cervical adenopathy. Psych:  Alert and cooperative. Normal mood and affect.depressed affect.     Impression & Recommendations:  Problem # 1:  ABDOMINAL PAIN, EPIGASTRIC (ICD-789.06) Assessment Deteriorated Unusual acute onset abdominal pain with refractory nausea and middle-aged female on chronic Nexium therapy. Considerations would include H. pylori acute infection, Candida infection, vasculitis with associated pancreatitis, or viral syndrome with worsening gastroparesis. Screening labs are been ordered and I will schedule endoscopic upper GI exam. I placed her on Carafate suspension 1 tablespoon every 2-4 hours as needed pending further workup. Orders: TLB-Hepatic/Liver  Function Pnl (80076-HEPATIC) TLB-Amylase (82150-AMYL) TLB-Lipase (83690-LIPASE) TLB-Sedimentation Rate (ESR) (85652-ESR) TLB-CRP-High Sensitivity (C-Reactive Protein) (86140-FCRP) EGD (EGD)  Problem #  2:  NAUSEA (ICD-787.02) Assessment: Unchanged continue p.r.n. Zofran and Nexium. Orders: TLB-Hepatic/Liver Function Pnl (80076-HEPATIC) TLB-Amylase (82150-AMYL) TLB-Lipase (83690-LIPASE) TLB-Sedimentation Rate (ESR) (85652-ESR) TLB-CRP-High Sensitivity (C-Reactive Protein) (86140-FCRP) EGD (EGD)  Problem # 3:  PERIPHERAL NEUROPATHY (ICD-356.9) Assessment: Unchanged  Problem # 4:  DISC DISEASE, CERVICAL (ICD-722.4) Assessment: Deteriorated Apparently she is seeing a new neurosurgeon at [facility].  Problem # 5:  HEMOCHROMATOSIS (ICD-275.0) Assessment: Unchanged Serum ferritin level is 150 ng percent and iron saturation is at normal levels. I am surprised she has not required more frequent phlebotomies. There is no evidence on exam or lab testing of progressive cirrhosis.  Patient Instructions: 1)  Copy sent to : Geoffry Paradise, MD 2)  Your procedure has been scheduled for 09/10/2011, please follow the seperate instructions.  3)  [facility] Patient Information Guide given to patient.  4)  Upper Endoscopy brochure given.  5)  Your prescription(s) have been sent to you pharmacy.  6)  The medication list was reviewed and reconciled.  All changed / newly prescribed medications were explained.  A complete medication list was provided to the patient / caregiver. Prescriptions: CARAFATE 1 GM/10ML SUSP (SUCRALFATE) take one tablespoon every 4 hours as needed  #1230ml x 0   Entered by:   Harlow Mares CMA (AAMA)   Authorized by:   Mardella Layman MD [facility]   Signed by:   Harlow Mares CMA (AAMA) on 09/08/2010   Method used:   Electronically to        [facility]  [address] [phone]* (retail)       [address]       [place], [place]  [address]        Ph: [phone] or [phone]       Fax: [phone]   RxID:   [id number]

## 2010-11-05 NOTE — Progress Notes (Signed)
Summary: Medication  Phone Note Call from Patient Call back at Home Phone [phone]   Caller: Patient Call For: Dr. Jarold Motto Reason for Call: Talk to Nurse Summary of Call: Extreme nausea, abd pain.Marland KitchenMarland KitchenMeds does not seem to be working Initial call taken by: Karna Christmas,  October 06, 2010 8:53 AM  Follow-up for Phone Call        Patient of Dr Norval Gable called in to report she started nausea and abdominal pain about 3 days ago. Patient had EGD on 09/09/10 w/ Candida Esophagitis, gastic polyps, and a stricture- placed on Diflucan. On 09/25/10 came in for f/u and c/o nausea and abdominal pain after meals. Dr Laurell Roof suggested [employer], use Zofran as needed and Nexium as ordered and considered ordering domperidone. Patient reports she can't eat anything d/t the nausea and is mainly on liquids; she denies diarrhea and vomiting. Patient is taking Carafate and Librax but would like her Zofran renewed until we can talk to Dr Jarold Motto. Graciella Freer RN  October 06, 2010 10:30 AM   Ordered Zofran per Willette Cluster NP ; informed patient of script and that I will send this note to Dr Jarold Motto about the Domperidone. Patient stated understanding. Graciella Freer RN  October 06, 2010 1:58 PM   Additional Follow-up for Phone Call Additional follow up Details #1::        needs to see dr. Jacky Kindle per her diabetes and needs to send me his evaluation... Additional Follow-up by: Mardella Layman MD [name] 1:01 PM    Additional Follow-up for Phone Call Additional follow up Details #2::    Notified patient per Dr Jarold Motto, to keep her appointment w/ Dr Jacky Kindle tomorrow and have him send [place] his evaluation. Patient stated dhe was feeling better today with the Zofran. Follow-up by: Graciella Freer RN,  October 07, 2010 5:00 PM

## 2010-11-05 NOTE — Letter (Signed)
Summary: Patient [facility] Biopsy Results  [facility]  [address] [place], [place] [address]   Phone: [phone]  Fax: [phone]        September 11, 2010 MRN: [id number]    [facility] [address] [place], [place]  [address]    Dear [name],  I am pleased to inform you that the biopsies taken during your recent endoscopic examination did not show any evidence of cancer upon pathologic examination.  Additional information/recommendations:  __No further action is needed at this time.  Please follow-up with      your primary care physician for your other healthcare needs.  __ Please call [phone] to schedule a return visit to review      your condition.  x__ Continue with the treatment plan as outlined on the day of your      exam.  __ You should have a repeat endoscopic examination for this problem              in _ months/years.   Please call us if you are having persistent problems or have questions about your condition that have not been fully answered at this time.  Sincerely,  Mardella Layman MD [facility]  This letter has been electronically signed by your physician.  Appended Document: Patient Notice-Endo Biopsy Results Letter mailed

## 2010-11-05 NOTE — Assessment & Plan Note (Signed)
Summary: F/U FROM EGD.Marland KitchenJJ.   History of Present Illness Visit Type: Follow-up Visit Primary GI MD: Sheryn Bison MD FACP FAGA Primary Provider: Geoffry Paradise, MD Chief Complaint: Follow up from EGD, still c/o nausea and abdominal discomfort after meals History of Present Illness:   Very Complicated but very pleasant 65 year old Caucasian female with severe gait disturbance and genetic hemochromatosis but no phlebotomies in over 3 years. She previously was followed by Dr. Myna Hidalgo at the cancer center. She now has problems with gastroparesis, occasional dumping syndrome, vague abdominal discomfort, chronic acid reflux, and chronic nausea not responsive to Librax t.i.d., Carafate, and recent treatment for Candida esophagitis with Diflucan. Endoscopy was otherwise unremarkable with negative exam for H. pylori and negative small bowel biopsy for celiac disease. She is seeing Dr. Quentin Mulling in [facility] for ataxia and gait disturbance with frequent falls. Apparently head  CT scans have been unremarkable. She denies any history of alcohol abuse. She has several family members with hemochromatosis and associated neuropathy and cerebellar dysfunction. She does have diffuse arthritis and takes Celebrex 2 times a day.  Review of her labs shows a blood sugar of over 350 mg percent in September. Fingerstick blood sugar drops today is 200 mg percent. 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 She currently denies dysphagia, anorexia, weight loss, melena or hematochezia.   GI Review of Systems    Reports bloating and  nausea.      Denies abdominal pain, acid reflux, belching, chest pain, dysphagia with liquids, dysphagia with solids, heartburn, loss of appetite, vomiting, vomiting blood, weight loss, and  weight gain.        Denies anal fissure, black tarry stools, change in bowel  habit, constipation, diarrhea, diverticulosis, fecal incontinence, heme positive stool, hemorrhoids, irritable bowel syndrome, jaundice, light color stool, liver problems, rectal bleeding, and  rectal pain.    Current Medications (verified): 1)  Metoprolol-Hydrochlorothiazide 100-50 Mg  Tabs (Metoprolol-Hydrochlorothiazide) .... Take 1 Tablet By Mouth Once A Day 2)  Cymbalta 30 Mg  Cpep (Duloxetine Hcl) .... Two Caps in The Morning 3)  Valtrex 500 Mg  Tabs (Valacyclovir Hcl) .... Once Daily 4)  Caltrate 600+d 600-400 Mg-Unit  Tabs (Calcium Carbonate-Vitamin D) .... One Two Times A Day 5)  Multivitamins   Tabs (Multiple Vitamin) .... Once Daily 6)  Buspar 10 Mg  Tabs (Buspirone Hcl) .... Take Three Times A Day With Meals 7)  Hydrocodone-Acetaminophen 10-660 Mg Tabs (Hydrocodone-Acetaminophen) .... As Needed For Pain 8)  Celebrex 200 Mg Caps (Celecoxib) .... Take 1 Capsule By Mouth Two Times A Day 9)  Furosemide 40 Mg Tabs (Furosemide) .... Take 1 Tablet By Mouth Once Daily 10)  Lamotrigine 100 Mg Tabs (Lamotrigine) .... Take 2 Tablets By Mouth Once Daily 11)  Zofran 4 Mg Tabs (Ondansetron Hcl) .... Take One Tablet By Mouth Every 6-8 Hours As Needed Nausea 12)  Librax 2.5-5 Mg Caps (Clidinium-Chlordiazepoxide) .... Take One By Mouth Three Times A Day Before Meals (Patient Has Only Been Taking 1 Tab Daily) 13)  Carafate 1 Gm/5ml Susp (Sucralfate) .... Take One Tablespoon Every 4 Hours As Needed 14)  Diflucan 100 Mg Tabs (Fluconazole) .... Take Two Tabs By Mouth One Day One and Then One By Mouth Once Daily For 2 Weeks.  Allergies (verified): No Known Drug Allergies  Past History:  Family History: Last updated: 09/08/2010 Family History of Diabetes: sister Family History of  Heart Disease: mother,grandmother,sister melanoma  sister Family History of Colon Cancer:mother  Mother died of oral cancer Lung cancer-father died Family History of Kidney Disease: Sister Family History of Liver  Disease: Sister Family History of Clotting disorder: Sister -hemochromatosis  Social History: Last updated: 09/08/2010 Patient has never smoked.  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   Past medical, surgical, family and social histories (including risk factors) reviewed for relevance to current acute and chronic problems.  Past Medical History: Reviewed history from 02/22/2008 and no changes required. Current Problems:  UTI'S, RECURRENT (ICD-599.0) SLEEP APNEA (ICD-780.57) INTERSTITIAL CYSTITIS (ICD-595.1) HYPERTENSION (ICD-401.9) FIBROMYALGIA (ICD-729.1) DEGENERATIVE JOINT DISEASE (ICD-715.90) OSTEOPOROSIS (ICD-733.00) PERIPHERAL NEUROPATHY (ICD-356.9) HERNIATED LUMBOSACRAL DISC (ICD-722.10) DISC DISEASE, CERVICAL (ICD-722.4) GERD (ICD-530.81) RENAL CYST, LEFT (ICD-593.2) FATTY LIVER DISEASE (ICD-571.8) CARCINOMA, COLON, FAMILY HX (ICD-V16.0) EARLY SATIETY (ICD-789.9) HEMOCHROMATOSIS (ICD-275.0) GASTRITIS (ICD-535.50) IRRITABLE BOWEL SYNDROME (ICD-564.1) DEPRESSION (ICD-311) GASTROPARESIS (ICD-536.3) MENINGIOMA (ICD-225.2)  Past Surgical History: Reviewed history from 09/08/2010 and no changes required. laminectomy c6-c7 resection of a meningioma Dr. Danielle Dess 05/23/1978 Cholecystectomy 1998 Hysterectomy metallic plates in neck Appendectomy cervical spine surgery X2 Left foot surgery Sinus Surgery  Family History: Reviewed history from 09/08/2010 and no changes required. Family History of Diabetes: sister Family History of Heart Disease: mother,grandmother,sister melanoma  sister Family History of Colon Cancer:mother  Mother died of oral cancer Lung cancer-father died Family History of Kidney Disease: Sister Family History of Liver Disease: Sister Family History of Clotting disorder: Sister -hemochromatosis  Social History: Reviewed history from 09/08/2010 and no changes required. Patient has  never smoked.  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   Review of Systems  The patient denies allergy/sinus, anemia, anxiety-new, back pain, blood in urine, breast changes/lumps, confusion, cough, coughing up blood, depression-new, fainting, fever, headaches-new, hearing problems, heart murmur, heart rhythm changes, itching, menstrual pain, muscle pains/cramps, night sweats, nosebleeds, pregnancy symptoms, shortness of breath, skin rash, sleeping problems, sore throat, swelling of feet/legs, swollen lymph glands, thirst - excessive , urination - excessive , urination changes/pain, urine leakage, vision changes, voice change, arthritis/joint pain, change in vision, and fatigue.         Sore on foot..she goes to the wound clinic at [facility] and has her foot that today. General:  Complains of fatigue; denies fever, chills, sweats, anorexia, weakness, malaise, weight loss, and sleep disorder. Neuro:  Complains of abnormal sensation, frequent falls, and difficulty walking; denies weakness, paralysis, seizures, syncope, tremors, vertigo, transient blindness, frequent headaches, headache, sciatica, radiculopathy other:, restless legs, memory loss, and confusion. Heme:  Complains of bruising; denies bleeding, enlarged lymph nodes, and pagophagia.  Vital Signs:  Patient profile:   65 year old female Height:      68 inches Weight:      199 pounds BMI:     30.37 BSA:     2.04 Pulse rate:   80 / minute Pulse rhythm:   regular BP sitting:   110 / 70  (left arm)  Vitals Entered By: Merri Ray CMA Duncan Dull) (September 25, 2010 10:46 AM) Patients blood Glucose test in office today and is 200   Physical Exam  General:  Well developed, well nourished, no acute distress. Head:  Normocephalic and atraumatic. Eyes:  PERRLA, no icterus. Lungs:  Clear throughout to auscultation. Heart:  Regular rate and rhythm; no murmurs, rubs,  or  bruits. Abdomen:  Soft, nontender and nondistended.  No masses, hepatosplenomegaly or hernias noted. Normal bowel sounds. Msk:  joint warmth and arthritic changes.  Her right foot is wrapped in a bandage was nontender to palpation.joint warmth and arthritic changes.   Neurologic:  Alert and  oriented x4;  grossly normal neurologically.ataxia.  ataxia.   Psych:  Alert and cooperative. Normal mood and affect.   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 I placed her on metformin 500 mg twice a day pending repeat evaluation by Dr. Lorain Childes. Hemoglobin A1c and other labs ordered. I suspect a lot of her problems are related to diabetic peripheral neuropathy, nonhealing ulcer on the sole of her right foot, and cerebellar dysfunction related to hemachromatosis. Orders: TLB-A1C / Hgb A1C (Glycohemoglobin) (83036-A1C) TLB-CBC Platelet - w/Differential (85025-CBCD) TLB-BMP (Basic Metabolic Panel-BMET) (80048-METABOL) TLB-Hepatic/Liver Function Pnl (80076-HEPATIC) TLB-TSH (Thyroid Stimulating Hormone) (84443-TSH) TLB-Ferritin (82728-FER) TLB-Folic Acid (Folate) (82746-FOL) TLB-B12, Serum-Total ONLY (82607-B12) TLB-IBC Pnl (Iron/FE;Transferrin) (83550-IBC) TLB-Amylase (82150-AMYL) TLB-Lipase (83690-LIPASE) TLB-Magnesium (Mg) (83735-MG) T-Beta Carotene (16109-60454) T- * Misc. Laboratory test [phone]) T- * Misc. Laboratory test [phone]) T- * Misc. Laboratory test [phone]) T- * Misc. Laboratory test [phone]) T-Vitamin D 25-Hydroxy & 1,25 Dihydroxy (8147) T- * Misc. Laboratory test [phone]) T- * Misc. Laboratory test [phone]) T- * Misc. Laboratory test [phone])  Problem # 2:  ABDOMINAL PAIN, EPIGASTRIC (ICD-789.06) Assessment: Improved Continue current GI medications with trial of rather strict gastroparesis diet. She may need prescription for domperidone therapy. Also some of her  symptoms seem consistent with late dumping syndrome and she has been asked to avoid high carbohydrate loads which she recently used at a party and developed subsequent cramping and diarrhea.  Problem # 3:  DIARRHEA (ICD-787.91) Assessment: Improved Consider chronic malabsorption and pancreatic insufficiency. She may need stool Elastase-1 assay to assess pancreatic exocrine function. We will see how she does only dumping diet first...  Problem # 4:  HEPATOMEGALY (ICD-789.1) Assessment: Unchanged Mildly abnormal liver enzymes probably related to iron deposition. Ferritin and iron levels ordered. She probably will need to begin repeat phlebotomies.  Problem # 5:  DEGENERATIVE JOINT DISEASE (ICD-715.90) Assessment: Unchanged Possible hemachromatosis related arthropathy. Continue Celebrex as tolerated.  Patient Instructions: 1)  Copy sent to : Geoffry Paradise, MD 2)  Please go to the basement today for your labs.  3)  Your prescription(s) have been sent to you pharmacy.  4)  Liquids and foods should be eaten in small, frequent meals. Refer to brochure for further instruction.  5)  You have an appt with Dr. Jacky Kindle on 10/08/2010 at 2pm. 6)  The medication list was reviewed and reconciled.  All changed / newly prescribed medications were explained.  A complete medication list was provided to the patient / caregiver. Prescriptions: METFORMIN HCL 500 MG TABS (METFORMIN HCL) take one by mouth two times a day  #60 x 1   Entered by:   Harlow Mares CMA (AAMA)   Authorized by:   Mardella Layman MD [facility]   Signed by:   Harlow Mares CMA (AAMA) on 09/25/2010   Method used:   Electronically to        [facility]  [address] [phone] (retail)       [address]       [place], [place]  [address]       Ph: [phone] or [phone]       Fax: [phone]  RxID:   [id number]

## 2010-11-05 NOTE — Procedures (Signed)
Summary: Upper Endoscopy  Patient: [name] Note: All result statuses are Final unless otherwise noted.  Tests: (1) Upper Endoscopy (EGD)   EGD Upper Endoscopy       DONE     [facility]     [address]     [place], [place]  [address]           ENDOSCOPY PROCEDURE REPORT           PATIENT:  [name], [name]  MR#:  [id number]     BIRTHDATE:  Jul 31, 1946, 64 yrs. old  GENDER:  female           ENDOSCOPIST:  Vania Rea. Jarold Motto, MD, [facility]     Referred by:           PROCEDURE DATE:  09/09/2010     PROCEDURE:  EGD with biopsy, 43239, Maloney Dilation of Esophagus     ASA CLASS:  Class III     INDICATIONS:  REFRACTORY NAUSEA.           MEDICATIONS:   Fentanyl 50 mcg IV, Versed 5 mg IV     TOPICAL ANESTHETIC:  Exactacain Spray           DESCRIPTION OF PROCEDURE:   After the risks benefits and     alternatives of the procedure were thoroughly explained, informed     consent was obtained.  The LB GIF-H180 [id number] endoscope was     introduced through the mouth and advanced to the second portion of     the duodenum, without limitations.  The instrument was slowly     withdrawn as the mucosa was fully examined.     <<PROCEDUREIMAGES>>           Candida esophagitis. WHITE.ADHESIVE PLAQUES IN UPPER ESOPHAGUS.SEE     PICTURES.  There were multiple polyps identified. BENIGN FUNDAL     POLYPS BIOPSIED.  Otherwise normal stomach.  Normal duodenal folds     were noted.  other findings. STENOTIC GE JUNCTION DILATED #84F     MALONEY DILATOR.TOLERATED WELL.NO HEME OR PAIN.    Retroflexed     views revealed no masses.    The scope was then withdrawn from the     patient and the procedure completed.           COMPLICATIONS:  None           ENDOSCOPIC IMPRESSION:     1) Candida esophagitis     2) Polyps, multiple     3) Otherwise normal stomach     4) Normal duodenal folds     5) Other findings     6) No masses     1.CANDIDA ESOPHAGITIS,MILD     2.GASTRIC POLYPS ASSOCIATED WITH  PPI USE LONG TERM.     3.R/O CELIAC DISEASE.     4.BENIGN STRICTURE FROM CHRONIC GERD DILATED     RECOMMENDATIONS:     1) Await pathology results     2) Clear liquids until, then soft foods rest iof day. Resume     prior diet tomorrow.     START DIFLUCAN 100 MG BID FOR 1 DAY, THEN DAILY FOR 2 WEEKS.HOLD     NEXIUM           REPEAT EXAM:  No           ______________________________     Vania Rea. Jarold Motto, MD, [facility]           CC:  Geoffry Paradise, MD  n.     eSIGNED:   Vania Rea. Patterson at 09/09/2010 10:15 AM           Melodie Bouillon, [id number]  Note: An exclamation mark (!) indicates a result that was not dispersed into the flowsheet. Document Creation Date: 09/09/2010 10:16 AM _______________________________________________________________________  (1) Order result status: Final Collection or observation date-time: 09/09/2010 10:04 Requested date-time:  Receipt date-time:  Reported date-time:  Referring Physician:   Ordering Physician: Sheryn Bison [phone]) Specimen Source:  Source: Launa Grill Order Number: [id number] Lab site:

## 2010-12-07 ENCOUNTER — Institutional Professional Consult (permissible substitution) (INDEPENDENT_AMBULATORY_CARE_PROVIDER_SITE_OTHER): Payer: Medicare Other | Admitting: Pulmonary Disease

## 2010-12-07 ENCOUNTER — Encounter: Payer: Self-pay | Admitting: Pulmonary Disease

## 2010-12-07 DIAGNOSIS — G4733 Obstructive sleep apnea (adult) (pediatric): Secondary | ICD-10-CM

## 2010-12-10 ENCOUNTER — Telehealth: Payer: Self-pay | Admitting: Gastroenterology

## 2010-12-11 ENCOUNTER — Encounter: Payer: Self-pay | Admitting: Nurse Practitioner

## 2010-12-11 ENCOUNTER — Ambulatory Visit: Payer: Medicare Other | Admitting: Nurse Practitioner

## 2010-12-11 ENCOUNTER — Ambulatory Visit (INDEPENDENT_AMBULATORY_CARE_PROVIDER_SITE_OTHER): Payer: Medicare Other | Admitting: Nurse Practitioner

## 2010-12-11 DIAGNOSIS — B3781 Candidal esophagitis: Secondary | ICD-10-CM | POA: Insufficient documentation

## 2010-12-11 DIAGNOSIS — K219 Gastro-esophageal reflux disease without esophagitis: Secondary | ICD-10-CM

## 2010-12-15 NOTE — Progress Notes (Signed)
Summary: Heartburn wants sooner appt  Phone Note Call from Patient Call back at [phone]   Call For: Dr Jarold Motto  Summary of Call: Having a lot of pain in her chest due to heartburn. Did not want to schedule first available appt on 01-06-11 feels she should be seen alot sooner. Initial call taken by: Leanor Kail [facility],  December 10, 2010 3:22 PM  Follow-up for Phone Call        Patient reports  heartburn getting increasingly worse. Patient was seen by Dr Jarold Motto in 09/2010 and was sent to her PCP d/t newly dx diabetes. She was supposed to f/u but never did. Patient reports Dr Jarold Motto had her stop her PPI-Nexium to see if that was causing her problems. Pt  takes Carafate as needed w/o much relief. Patient given an appointment in am with Willette Cluster NP . Follow-up by: Graciella Freer RN,  December 10, 2010 4:03 PM

## 2010-12-22 NOTE — Assessment & Plan Note (Signed)
Summary: self referral for management of osa   Visit Type:  Initial Consult Copy to:  Self Referral Primary Provider/Referring Provider:  Geoffry Paradise, MD  CC:    Pt c/o difficulty falling and staying asleep.  States she feels "sleepy and groggy" during the day.  Pt has a cpap but doesn't currently use it. Marland Kitchen  History of Present Illness: the pt is a 65y/o female who comes in today as a self referral for management of osa.  She was diagnosed with mild osa in 2008, with AHI 16/hr, and ultimately was treated with cpap.  She did feel cpap helped her while wearing, but currently is not using due to ?mechanical problem.  Currently, she is having issues with her sleep, and has been noted to have loud snoring and an abnormal breathing pattern during sleep.  She goes to bed around 11pm, and arises btw 7-10am.  She awakens frequently during the night, and is not rested upon arising.  She notes signficant sleepiness during the day anytime she sits down, including reading, watch tv, or while talking on the telephone.  She gets sleepy driving as well, and this worries her.  Her weight is actually down 25 pounds recently due to an "esophageal problem".    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.1) HYPERTENSION (ICD-401.9) FIBROMYALGIA (ICD-729.1) DEGENERATIVE JOINT DISEASE (ICD-715.90) OSTEOPOROSIS (ICD-733.00) PERIPHERAL NEUROPATHY (ICD-356.9) HERNIATED LUMBOSACRAL DISC (ICD-722.10) DISC DISEASE, CERVICAL (ICD-722.4) GERD (ICD-530.81) RENAL CYST, LEFT (ICD-593.2) FATTY LIVER DISEASE (ICD-571.8) CARCINOMA, COLON, FAMILY HX (ICD-V16.0) EARLY SATIETY (ICD-789.9) HEMOCHROMATOSIS (ICD-275.0) GASTRITIS (ICD-535.50) IRRITABLE BOWEL SYNDROME (ICD-564.1) DEPRESSION (ICD-311) GASTROPARESIS (ICD-536.3) MENINGIOMA (ICD-225.2)  Past Surgical History: laminectomy c6-c7  resection of a meningioma Dr. Danielle Dess 05/23/1978 Cholecystectomy Nov 1997 Hysterectomy metallic plates in neck august 1997 and march 1998 Appendectomy cervical spine surgery X2 Left foot surgery Sinus Surgery  1992 tubal ligation  Family History: Reviewed history from 09/08/2010 and no changes required. Family History of Diabetes: sister Family History of Heart Disease: mother, maternal grandmother,sister melanoma  sister Family History of Colon Cancer:mother  Mother died of oral cancer Lung cancer-father died Family History of Kidney Disease: Sister Family History of Liver Disease: Sister Family History of Clotting disorder: Sister -hemochromatosis allergies: mother, sister, brother  Social History: Reviewed history from 09/08/2010 and no changes required. Patient has never smoked.  Alcohol Use - no Occupation: Retired/disabled. prev worked in Airline pilot. pt is married but lives alone. pt does not have any children. Illicit Drug Use - no Patient does not get regular exercise.   Review of Systems       The patient complains of shortness of breath with activity, shortness of breath at rest, acid heartburn, indigestion, weight change, tooth/dental problems, nasal congestion/difficulty breathing through nose, hand/feet swelling, and joint stiffness or pain.  The patient denies productive cough, non-productive cough, coughing up blood, chest pain, irregular heartbeats, loss of appetite, abdominal pain, difficulty swallowing, sore throat, headaches, sneezing, itching, ear ache, anxiety, depression, rash, change in color of mucus, and fever.    Vital Signs:  Patient profile:   65 year old female Height:      68 inches Weight:      194.25 pounds O2 Sat:      97 % on Room air Temp:     98.4 degrees F oral Pulse rate:   70 / minute BP sitting:   144 / 66  (left arm) Cuff size:  large  Vitals Entered By: Arman Filter LPN (December 06, 3084 10:52 AM)  O2 Flow:  Room air CC:    Pt c/o difficulty falling and staying asleep.  States she feels "sleepy and groggy" during the day.  Pt has a cpap but doesn't currently use it.  Comments Medications reviewed with patient Arman Filter LPN  December 07, 5782 11:01 AM    Physical Exam  General:  wd female in nad Eyes:  PERRLA and EOMI.   Nose:  mildly narrowed, but patent Mouth:  mild elongation of soft palate and uvula Neck:  no jvd,tmg, LN Lungs:  clear to auscultation Heart:  rrr, no mrg  Abdomen:  soft and nontender, bs+ Extremities:  mild ankle edema, no cyanosis pulses intact distally Neurologic:  alert, oriented,  moves all 4.     Impression & Recommendations:  Problem # 1:  OBSTRUCTIVE SLEEP APNEA (ICD-327.23) the pt has a history of osa, but has not been on cpap for awhile.  She did feel the cpap helped her when she wore.  She currently is sleeping poorly, and has severe daytime sleepiness that is really bothering her.  At this point, need to have her home machine/mask checked for functioning.  Will also re-optimize her pressure on autoset since it was last checked a few yrs ago.  Will then arrange f/u to see how things are going.    Medications Added to Medication List This Visit: 1)  Lamotrigine 200 Mg Tabs (Lamotrigine) .... Take 1 tablet by mouth two times a day 2)  Glimepiride 4 Mg Tabs (Glimepiride) .... Take 1 tablet by mouth once a day 3)  Klor-con 20 Meq Pack (Potassium chloride) .... Take 1 tablet by mouth once a day 4)  Lyrica 225 Mg Caps (Pregabalin) .... Take 1 tablet by mouth two times a day 5)  Vicodin Hp 10-660 Mg Tabs (Hydrocodone-acetaminophen) .Marland Kitchen.. 1 tab by mouth every 6 hours as needed  Other Orders: New Patient Level IV (69629) DME Referral (DME)  Patient Instructions: 1)  will get your machine and mask checked to make sure in working order. 2)  will recheck you pressure with an auto device for 2 weeks, and call you with your optimal pressure 3)  will arrange followup once you have  had a chance to get back on your cpap at optimal pressure

## 2010-12-23 ENCOUNTER — Ambulatory Visit (INDEPENDENT_AMBULATORY_CARE_PROVIDER_SITE_OTHER): Payer: Medicare Other | Admitting: Obstetrics and Gynecology

## 2010-12-23 DIAGNOSIS — N898 Other specified noninflammatory disorders of vagina: Secondary | ICD-10-CM

## 2010-12-23 DIAGNOSIS — B373 Candidiasis of vulva and vagina: Secondary | ICD-10-CM

## 2010-12-28 ENCOUNTER — Encounter: Payer: Self-pay | Admitting: Pulmonary Disease

## 2010-12-31 NOTE — Assessment & Plan Note (Signed)
Summary: Increased heartburn   History of Present Illness Visit Type: Follow-up Visit Primary GI MD: Sheryn Bison MD FACP FAGA Primary Provider: Geoffry Paradise, MD Chief Complaint: Increased heartburn worsening since patient stopped ppi History of Present Illness:   Patient is a 65 year old female known to Dr. Jarold Motto for history of gastroparesis, occasional dumping syndrome,  chronic acid reflux. EGD early December 2011 remarkable for multiple fundal polyps, stenosis of gastroesophageal junction and candida esophagitis. Because of multiple fundal polyps her PPI was discontinued. Patient presents today with recurrent heartburn. She is miserable. Carafate hasn't really helped but taking after meals. Adhering to a gastroparesis diet. No abdominal pain or nausea.  No recent antibiotics, blood sugars are good.      GI Review of Systems    Reports acid reflux, belching, chest pain, and  heartburn.      Denies abdominal pain, bloating, dysphagia with liquids, dysphagia with solids, loss of appetite, nausea, vomiting, vomiting blood, weight loss, and  weight gain.        Denies anal fissure, black tarry stools, change in bowel habit, constipation, diarrhea, diverticulosis, fecal incontinence, heme positive stool, hemorrhoids, irritable bowel syndrome, jaundice, light color stool, liver problems, rectal bleeding, and  rectal pain.    Current Medications (verified): 1)  Metoprolol-Hydrochlorothiazide 100-50 Mg  Tabs (Metoprolol-Hydrochlorothiazide) .... Take 1 Tablet By Mouth Once A Day 2)  Cymbalta 30 Mg  Cpep (Duloxetine Hcl) .... Two Caps in The Morning 3)  Valtrex 500 Mg  Tabs (Valacyclovir Hcl) .... Once Daily 4)  Caltrate 600+d 600-400 Mg-Unit  Tabs (Calcium Carbonate-Vitamin D) .... One Two Times A Day 5)  Multivitamins   Tabs (Multiple Vitamin) .... Once Daily 6)  Celebrex 200 Mg Caps (Celecoxib) .... Take 1 Capsule By Mouth Two Times A Day 7)  Furosemide 40 Mg Tabs (Furosemide)  .... Take 1 Tablet By Mouth Once Daily 8)  Lamotrigine 200 Mg Tabs (Lamotrigine) .... Take 1 Tablet By Mouth Two Times A Day 9)  Zofran 4 Mg Tabs (Ondansetron Hcl) .... Take One Tablet By Mouth Every 6-8 Hours As Needed Nausea 10)  Glimepiride 4 Mg Tabs (Glimepiride) .... Take 1 Tablet By Mouth Once A Day 11)  Klor-Con 20 Meq Pack (Potassium Chloride) .... Take 1 Tablet By Mouth Once A Day 12)  Lyrica 225 Mg Caps (Pregabalin) .... Take 1 Tablet By Mouth Two Times A Day 13)  Vicodin Hp 10-660 Mg Tabs (Hydrocodone-Acetaminophen) .Marland Kitchen.. 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 laminectomy c6-c7 resection of a meningioma Dr. Danielle Dess 05/23/1978 Cholecystectomy 1998 Hysterectomy metallic plates in neck Appendectomy cervical spine surgery X2 Left foot surgery Sinus Surgery  Family History: Reviewed history from 09/08/2010 and no changes required. Family History of Diabetes: sister Family History of Heart Disease: mother,grandmother,sister melanoma  sister Family History of Colon Cancer:mother  Mother died of oral cancer Lung cancer-father died Family History of Kidney Disease: Sister Family History of Liver  Disease: Sister Family History of Clotting disorder:  Sister -hemochromatosis  Social History: Reviewed history from 09/08/2010 and no changes required. Patient has never smoked.  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   Review of Systems       The patient complains of fatigue.  The patient denies allergy/sinus, anemia, anxiety-new, arthritis/joint pain, back pain, blood in urine, breast changes/lumps, change in vision, confusion, cough, coughing up blood, depression-new, fainting, fever, headaches-new, hearing problems, heart murmur, heart rhythm changes, itching, menstrual pain, muscle pains/cramps, night sweats, nosebleeds, pregnancy symptoms, shortness of breath, skin rash, sleeping problems, sore throat, swelling of feet/legs, swollen lymph glands, thirst - excessive , urination - excessive , urination changes/pain, urine leakage, vision changes, and voice change.    Vital Signs:  Patient profile:   65 year old female Height:      68 inches Weight:      200.13 pounds BMI:     30.54 Pulse rate:   68 / minute Pulse rhythm:   regular BP sitting:   140 / 70  (left arm) Cuff size:   regular  Vitals Entered By: June McMurray CMA Duncan Dull) (December 11, 2010 9:30 AM)  Physical Exam  General:  Well developed, well nourished, no acute distress. Head:  Normocephalic and atraumatic. Eyes:  Conjunctiva pink, no icterus.  Mouth:  No oral lesions. Tongue moist.  Neck:  no obvious masses  Lungs:  Clear throughout to auscultation. Heart:  RRR Abdomen:  Abdomen soft, nontender, nondistended. No obvious masses or hepatomegaly.Normal bowel sounds.  Msk:  Symmetrical with no gross deformities. Normal posture. Extremities:  No palmar erythema, no edema.  Neurologic:  Alert and  oriented x4;  grossly normal neurologically. Skin:  Intact without significant lesions or rashes. Cervical Nodes:  No significant cervical adenopathy. Psych:   Alert and cooperative. Normal mood and affect.   Impression & Recommendations:  Problem # 1:  GERD (ICD-530.81) Assessment Deteriorated Severe heartburn since discontinuation of PPI for multiple gastric polyps on EGD. Need to change timing of Carafate to BEFORE meals. If still no improvement will try Gaviscon after meals and at bedtime. If still no improvement will add in H2 blocker such as Zantac. Patient will call with condition update in a few days.   Problem # 2:  GASTROPARESIS (ICD-536.3) Assessment: Improved No nausea.  Problem # 3:  Hx of CANDIDIASIS, ESOPHAGEAL (ICD-112.84) Assessment: Improved Treated with Diflucan in June. No odynophagia. No recent antibiotics. States blood sugars are well controlled.  Patient Instructions: 1)  Try taking the Carafate 30 min before meals three times a day and at bedtime. If after a few days this isn't helping, may discontinue and try Gaviscon  Extra Strength 30 cc after meals 3 times a day. 2)  This is doesn't help call us. 3)  Copy sent to : Dr. Forrest Moron 4)  The medication list was reviewed and reconciled.  All changed / newly prescribed medications were explained.  A complete medication list was provided to the patient / caregiver.

## 2011-01-27 ENCOUNTER — Other Ambulatory Visit: Payer: Self-pay | Admitting: Pulmonary Disease

## 2011-01-27 ENCOUNTER — Telehealth: Payer: Self-pay | Admitting: Pulmonary Disease

## 2011-01-27 DIAGNOSIS — G4733 Obstructive sleep apnea (adult) (pediatric): Secondary | ICD-10-CM

## 2011-01-27 NOTE — Telephone Encounter (Signed)
Please let pt know that her auto for 2 weeks showed optimal pressure 12cm She did have a little more leak than I would like to see, and should work with dme on mask fit, ?different mask? Will send an order to dme to increase pressure to 12cm

## 2011-01-27 NOTE — Telephone Encounter (Signed)
Received results and put in KC's very important look at folder for him to review.  

## 2011-01-27 NOTE — Telephone Encounter (Signed)
[email or web address]

## 2011-01-27 NOTE — Telephone Encounter (Signed)
Pt states she had cpap titration study about 1 month ago with [facility] and is calling for results.. Pls advise if results have been received.

## 2011-01-28 NOTE — Telephone Encounter (Signed)
lmomtcb  

## 2011-01-29 NOTE — Telephone Encounter (Signed)
[email or web address]

## 2011-01-29 NOTE — Telephone Encounter (Signed)
Pt phoned stated that she was returning a call to triage she can be reached at [phone].Vedia Coffer

## 2011-02-01 NOTE — Telephone Encounter (Signed)
Called, spoke with pt.  She was informed of auto results and recs per Dr. Shelle Iron.  She verbalized understanding of this.

## 2011-02-09 ENCOUNTER — Telehealth: Payer: Self-pay | Admitting: Pulmonary Disease

## 2011-02-09 NOTE — Telephone Encounter (Signed)
Pt says cpap setting was changed to 12 from 6 and she is calling to make sure this is correct. Pt aware we will double check with Dr. Shelle Iron and call her back. Pls advise.

## 2011-02-09 NOTE — Telephone Encounter (Signed)
Let her know that 12 was correct.  I would like for her to try ramp button set on 30 or to see if she will get used to this.  If still an issue, can decrease to 10.  Let her know we always look at birthdate or medical record number of the reports.

## 2011-02-09 NOTE — Telephone Encounter (Signed)
Spoke with pt.  She was informed of KC's statement and verbalized understanding of this.

## 2011-02-11 ENCOUNTER — Telehealth: Payer: Self-pay | Admitting: Pulmonary Disease

## 2011-02-11 NOTE — Telephone Encounter (Signed)
Pt states she has been on new pressure x 2 nights and full pressure makes her sinuses hurt. Per phone note from 02/09/2011 Kc had suggested that she set the ramp for 30 to 45 min to see if this would help. She states her machine will only let the ramp be set at 25 min. She is going to call [facility] to see if this can be changed and call me back with thta information.

## 2011-02-11 NOTE — Telephone Encounter (Signed)
I spoke with Ed @ [facility] and they are going to call the pt to see if they can change ramp setting to 45 minutes. Pt instructed to call our office back if 1) they cannot change ramp setting and/or 2) increased ramp setting does not work or help. I will forward to [facility] so he is aware.

## 2011-02-12 NOTE — Telephone Encounter (Signed)
Noted  

## 2011-02-15 NOTE — Telephone Encounter (Signed)
lmomtcb x 1. Calling to see if DME was able to reset ramp for cpap.

## 2011-02-16 NOTE — Telephone Encounter (Signed)
Spoke with pt and she states the DME did set up ramp for her cpap and nothing further needed. Carron Curie, CMA

## 2011-02-16 NOTE — Consult Note (Signed)
NEW PATIENT CONSULTATION   Pape, Tynesha BOWDEN  DOB:  09-04-1946                                       01/09/2010  AOZHY#:[id number]   The patient presents today for evaluation of lower extremity discomfort,  more so right on right than on the left and also swelling.  She is a  very pleasant 65 year old white female with multiple medical problems.  I have been provided a complete evaluation by Dr. Jacky Kindle and have  reviewed this.  Her reason for seeing me is for evaluation of right leg  pain and swelling.  She is concerned that her mother-in-law had  progressive tissue loss after ankle surgery with very poor arterial flow  and ended up with amputation by myself many years ago.  This is  concerning her due to some changes which she is having in her right leg  as well.  She does have a longstanding history of peripheral neuropathy  and also has degenerative disk disease for which she has undergone  injections with mild relief of her symptoms.   PAST HISTORY:  Significant for hypertension.  She does have noninsulin-  dependent diabetes.  She also has a history of hemochromatosis.   FAMILY HISTORY:  Significant for premature atherosclerotic disease in  her mother and sister.   SOCIAL HISTORY:  She is married.  She has no children.  She been  disabled since 1997 due to cervical disease.  She does not smoke or  drink alcohol.   REVIEW OF SYSTEMS:  CONSTITUTIONAL:  No weight loss or weight gain.  Her  weight is reported at 208 pounds.  She is 5 feet 8 inches tall.  She  does have a history of coronary disease.  PULMONARY:  Does have shortness of breath with lying flat.  GI:  Positive for reflux.  MUSCULOSKELETAL:  For arthritic joint pain and muscle pain.  NEUROLOGIC:  For neuropathy.  PSYCHIATRIC:  For depression.  HEENT:  Change in eyesight and change in hearing.  HEMATOLOGIC:  Hemochromatosis.   PHYSICAL EXAMINATION:  Well-developed white female appearing  stated age.  Blood pressure is 150/81, pulse 67, respirations 18.  Her radial pulses  are 2+ bilaterally.  HEENT:  Normal.  Her musculoskeletal shows no major  deformities.  Does have extensive telangiectasia in her right medial  calf shooting down to her ankle and so on her left calf.  Neurologic:  No focal weakness or paresthesia.  Skin without ulcers or rashes.  She  has palpable posterior tibial pulses bilaterally.  She does have some  slight increased in her right leg versus her left leg.   I had a long discussion with the patient.  I explained that she has  normal arterial flow and therefore is at no risk for that tissue loss  related to this.  I explained that with the telangiectasia that she may  have some increased venous hypertension and potentially could have  valvular dysfunction.  I explained that we would not recommend any  treatment for this mild level and therefore would not recommend  increased studies unless she should develop some symptoms related to  this.  She understands this and was reassured with this discussion.  She  will see [place] again on an as-needed basis.     Larina Earthly, M.D.  Electronically Signed   TFE/MEDQ  D:  01/09/2010  T:  01/12/2010  Job:  [id number]   cc:   Geoffry Paradise, M.D.

## 2011-02-16 NOTE — Assessment & Plan Note (Signed)
[facility]                         GASTROENTEROLOGY OFFICE NOTE   NAME:[name], [name]                 MRN:          [id number]  DATE:01/23/2008                            DOB:          Apr 06, 1946    [name] is a 65 year old, very complex white female, with multiple  medical and surgical problems.  She is referred by Dr. Jacky Kindle for  evaluation of diffuse abdominal pain, which has been present for the  last 1-2 months.  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   [name] carries a diagnosis of hemachromatosis.  She sees Dr. Arlan Organ, and apparently in the past has had phlebotomies; although she  has not had one in 2 years.  She has had recurrent diffuse abdominal  pain of many years, and apparently underwent an emergency exploratory  laparotomy in 1982 and had an appendectomy performed.  She has also had  previous laparoscopy, and apparently suffers from lower pelvic  adhesions.  She was told at one time my right tube is adhered to my  colon.  She previously was seen by myself many years ago because of  abdominal fullness, nausea, early satiety, abdominal pain and  alternating diarrhea and constipation.  This actually goes back to 1983.  She has had multiple biopsies of her colon done for microscopic-  collagenous colitis; these have all been normal.  In the past she has  been treated with Questran and antidepressants, again without  improvement.  Her last colonoscopy was completed March 2004 and was  normal.  She does have a family history of colon cancer in her mother at  age 56.   Recurrent abdominal pain is almost daily, and is described as a diffuse  cramping with some gas and bloating; fairly regular stools.  She  specifically denies bloody stools or melena.  She has had no anorexia,  weight loss or  specific food intolerances.  She denies abuse of NSAIDs,  alcohol or cigarettes.  There has been no anorexia or weight loss.  She  is status post cholecystectomy because of gallstones that were removed  in 1998.  At that time, her ultrasound also showed fatty infiltration of  the liver.  She recently had a repeat CT scan of her abdomen on December 27, 2007 per Dr. Jacky Kindle, which was entirely normal except for diffuse  fatty infiltration of her liver and a 2 cm left renal cyst.  Her CBC and  metabolic profiles were normal, except for a blood sugar 147 mg%, with  an elevated hemoglobin A1c of 7.3.   Mrs. Welke has chronic GERD and is on Prilosec 20 mg twice a day; she  says this does not work as well as Nexium.  She described dysphagia or  odynophagia.  She has had no clay-colored stools, dark urine, icterus,  fever or chills.  She has had no nocturnal awakening with pain.   PAST MEDICAL HISTORY:  This is very complex and revolves around  arthritis related to her  hemachromatosis; but also severe cervical spine  disease with previous cervical spine surgery x2.  She apparently has had  metallic plates placed in her neck.  She also has a somewhat complex  gynecologic history and has being followed by Dr. Eda Paschal.  From what  I can ascertain, she has not had a hysterectomy performed.  Her previous  neurosurgery was by Dr. Barnett Abu, and he also relates that the  patient has degenerative disk disease and bulging disks in the  lumbosacral spine area.  She suffers from peripheral neuropathy.  She  also in the past was diagnosed as having a meningioma at C6-C7, with  surgical intervention.  She suffers from osteoporosis, degenerative  arthritis, chronic depression, fibromyalgia, hypertension, interstitial  cystitis, sleep apnea and recurrent urinary tract infections.  She also  has had surgery on her sinuses.   MEDICATIONS:  1. Metoprolol/hydrochlorothiazide 100/50 mg daily.  2. Prilosec 20  mg twice a day.  3. Cymbalta 60 mg in the morning and 30 mg at bedtime.  4. Proventil 200 mg a day for narcolepsy.  5. Lyrica 325 mg twice a day for peripheral neuropathy.  6. Clonazepam p.r.n. 0.5 mg at bedtime.  7. Valtrex 500 mg daily.  8. Calcium and vitamin D twice a day.  9. Multivitamins daily.  10.Minocycline 100 mg a day.  11.A variety of eye drops.   ALLERGIES:  Surprisingly she denies drug allergies.   FAMILY HISTORY:  Remarkable for colon cancer in her mother at age 66.  Atherosclerosis and diabetes in multiple family members.  Her sister  also has hemachromatosis, as do her children.   SOCIAL HISTORY:  The patient is married and takes care of her husband,  who is disabled with Parkinson's disease.  The patient herself is  disabled because of her chronic medical problems.  She does not abuse  ethanol and does not smoke.   REVIEW OF SYSTEMS:  Positive for multiple complaints.  She apparently  has recurrent hematuria, and is followed by Dr. Isabel Caprice in Urology.  She  has had fibromyalgia, with associated depression for many years.  She  also suffers from chronic headaches, swelling of her lower extremities,  and of course diffuse myalgias and arthralgias.  She denies any current  cardiovascular or pulmonary complaints.  The review of systems is  otherwise noncontributory.   PHYSICAL EXAMINATION:  She is a surprisingly healthy appearing white  female in no distress, appearing her stated age.  She is 5 foot 9  inches, and weighs 212 pounds.  Blood pressure 108/60, and her pulse was  68 and regular.  I could not appreciate stigmata of chronic liver  disease.  Her chest was generally clear and she was in a regular rhythm,  without murmur, gallops or rubs.  I could not appreciate  hepatosplenomegaly, abdominal masses or tenderness.  There was a well-  healed midline suprapubic incision.  There was some slight tenderness to  deep palpation of the left lower quadrant.   Inspection of the rectum was  unremarkable, as was the rectal examination.  There was formed stool in  the rectal vault that was guaiac negative.  Her extremities showed no  swollen joints, edema or phlebitis.  Her mental status was clear.   ASSESSMENT:  1. Probable flare of irritable bowel syndrome, exacerbated by personal      stress in her life.  2. Time for a screening colonoscopy, because of her family history of      colon cancer  in her mother at age 73.  3. Chronic gastroesophageal reflux disease, doing significantly worse      on Prilosec as opposed to Nexium.  4. Status post cholecystectomy.  5. History of chronic abdominal pain, with previous exploratory      surgery and laparoscopies.  6. History of hemachromatosis with periodic phlebotomies; followed by      Dr. Myna Hidalgo.  7. 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  8. Cervical and lumbosacral spine disease, with multiple neurosurgical      procedures.  9. History of fibromyalgia.  10.Probable new onset diabetes mellitus.  11.Rule out associated celiac disease.  .  1. History of chronic essential hypertension.  2. History of interstitial cystitis.  3. History of chronic anxiety and depression.  4. Vague history of sleep apnea.   RECOMMENDATIONS:  1. I have gone ahead and scheduled Mrs. Yetta Flock for outpatient      endoscopy and colonoscopy as soon as possible, to get some handle      on her problems.  2. Request records from Dr. Myna Hidalgo, for review concerning her      hemachromatosis.  3. Probable irritable bowel syndrome regime and antispasmodic therapy      depending on results of her evaluations.  4. Continue all multiple medications listed above from Dr. Jacky Kindle.     Vania Rea. Jarold Motto, MD, Caleen Essex, FAGA  Electronically Signed    DRP/MedQ  DD: 01/23/2008  DT: 01/23/2008  Job #: [id number]   cc:   Geoffry Paradise, M.D.  Rose Phi. Myna Hidalgo, M.D.

## 2011-02-19 NOTE — Op Note (Signed)
[facility]  Patient:    [name], [name] Visit Number: [id number] MRN: [id number]          Service Type: DSU Location: [facility] Attending Physician:  Sharon Mt Dictated by:   Rande Brunt. Eda Paschal, M.D. Proc. Date: 10/26/01 Admit Date:  10/26/2001                             Operative Report  PREOPERATIVE DIAGNOSIS:       Postmenopausal bleeding, endometrial polyp.  POSTOPERATIVE DIAGNOSIS:      Postmenopausal bleeding, endometrial polyp.  OPERATION:                    Hysteroscopy, dilatation and curettage, excision of endometrial polyp.  SURGEON:                      Daniel L. Eda Paschal, M.D.  ANESTHESIA:                   General.  INDICATIONS:                  Patient is a 65 year old nulligravida who had presented to the office with postmenopausal bleeding without hormonal replacement therapy.  Sonohysterogram done in the office showed an intrauterine cavity defect of about 2 cm and a second one of actually less than 1 cm.  As a result of postmenopausal bleeding and an abnormal endometrial cavity, she now enters the hospital for hysteroscopy/D&C.  FINDINGS:                     External vaginal examination is within normal limits.  Cervix is clean.  Uterus is normal size and shape without any descensus.  Adnexa are palpably normal.  At the time of hysteroscopy patient had a lesion in the lower uterine segment of approximately 2 cm consistent with sonohysterogram findings.  It was removed in pieces and appeared to be most consistent with a benign endometrial polyp.  After it had been removed the rest of the cavity was carefully evaluated.  Top of the fundus, tubal ostia, anterior and posterior walls of the fundus, lower uterine segment, endocervical canal were free of disease after the polyp had been excised.  PROCEDURE:                    After adequate general anesthesia the patient was placed in the dorsal lithotomy position,  prepped and draped in the usual sterile manner.  A single tooth tenaculum was placed in the anterior lip of the cervix.  The cervix was progressively dilated to a #35 Pratt dilator. This was somewhat difficult because of her postmenopausal state and the fact that she was nulliparous but it was accomplished without lacerating the cervix.  A hysteroscopic resectoscope was then inserted.  Sorbitol 3% was used to expand the intrauterine cavity.  A camera was used for magnification.  The cavity was visualized and was as noted above.  A 90 degree wire loop set at 70 coag, 110 cutting was placed and the polyp was resected in pieces and removed. A sharp curettage was done and fairly significant tissue was removed.  All tissue was sent to pathology for tissue diagnosis.  The patient was rehysteroscoped and was free of any disease.  Pictures were taken pre and post polyp removal for documentation.  Fluid deficit was 70 cc.  Blood loss was less than  50 cc.  Patient tolerated her procedure well and left the operating room in satisfactory condition. Dictated by:   Rande Brunt. Eda Paschal, M.D. Attending Physician:  Sharon Mt DD:  10/26/01 TD:  10/27/01 Job: [id number]

## 2011-03-30 ENCOUNTER — Telehealth: Payer: Self-pay | Admitting: Gastroenterology

## 2011-03-30 NOTE — Telephone Encounter (Signed)
Pt with hx of gastroparesis, acid reflux, chronic nausea and with EGD on 09/2010 she was taken off Nexium d/t polyps; she was placed on Gaviscon for heartburn. She saw Willette Cluster, NP on 12/11/10 for increasing heartburn which carafate was not helping. Paula instructed pt to change carafate to 30 minutes before meals and at bedtime. She needs to start Gaviscon after meals. If she needs further help, she can try an H2 blocker such as Zantac. Today pt reports the heartburn has gradually gotten worse since last December when she was taken off her Nexium. She reports Sunday was terrible for her and she almost went to the ER her pain was so bad. She reports the Gaviscon isn't helping that much. I asked if she was taking meds as prescribed by Gunnar Fusi and added Zantac and she stated no. Pt responded her husband is in the nursing home and she doesn't always take the carafate as ordered and she did not add zantac. I offered the pt an appt on Thursday, but she agreed since she didn't follow the past instructions she will try them and call if no better in a week or two.

## 2011-04-01 ENCOUNTER — Other Ambulatory Visit: Payer: Self-pay | Admitting: Gastroenterology

## 2011-05-05 ENCOUNTER — Other Ambulatory Visit (HOSPITAL_COMMUNITY): Payer: Self-pay | Admitting: Neurological Surgery

## 2011-05-05 DIAGNOSIS — M545 Low back pain, unspecified: Secondary | ICD-10-CM

## 2011-05-13 ENCOUNTER — Ambulatory Visit (HOSPITAL_COMMUNITY)
Admission: RE | Admit: 2011-05-13 | Discharge: 2011-05-13 | Disposition: A | Discharge status: Home / Self Care | Payer: Medicare Other | Source: Ambulatory Visit | Attending: Neurological Surgery | Admitting: Neurological Surgery

## 2011-05-13 ENCOUNTER — Other Ambulatory Visit (HOSPITAL_COMMUNITY): Payer: Self-pay | Admitting: Neurological Surgery

## 2011-05-13 DIAGNOSIS — M48061 Spinal stenosis, lumbar region without neurogenic claudication: Secondary | ICD-10-CM | POA: Insufficient documentation

## 2011-05-13 DIAGNOSIS — M545 Low back pain, unspecified: Secondary | ICD-10-CM | POA: Insufficient documentation

## 2011-05-13 DIAGNOSIS — M51379 Other intervertebral disc degeneration, lumbosacral region without mention of lumbar back pain or lower extremity pain: Secondary | ICD-10-CM | POA: Insufficient documentation

## 2011-05-13 DIAGNOSIS — M5137 Other intervertebral disc degeneration, lumbosacral region: Secondary | ICD-10-CM | POA: Insufficient documentation

## 2011-05-13 LAB — GLUCOSE, CAPILLARY: Glucose-Capillary: 135 mg/dL — ABNORMAL HIGH (ref 70–99)

## 2011-05-13 MED ORDER — IOHEXOL 180 MG/ML  SOLN
16.0000 mL | Freq: Once | INTRAMUSCULAR | Status: AC | PRN
  Administered 2011-05-13: 16 mL via INTRATHECAL

## 2011-06-08 ENCOUNTER — Encounter: Payer: Self-pay | Admitting: *Deleted

## 2011-06-08 DIAGNOSIS — B009 Herpesviral infection, unspecified: Secondary | ICD-10-CM | POA: Insufficient documentation

## 2011-06-08 DIAGNOSIS — N870 Mild cervical dysplasia: Secondary | ICD-10-CM | POA: Insufficient documentation

## 2011-06-08 DIAGNOSIS — G629 Polyneuropathy, unspecified: Secondary | ICD-10-CM | POA: Insufficient documentation

## 2011-06-08 DIAGNOSIS — N736 Female pelvic peritoneal adhesions (postinfective): Secondary | ICD-10-CM | POA: Insufficient documentation

## 2011-06-09 ENCOUNTER — Ambulatory Visit (INDEPENDENT_AMBULATORY_CARE_PROVIDER_SITE_OTHER): Payer: Medicare Other | Admitting: Obstetrics and Gynecology

## 2011-06-09 ENCOUNTER — Encounter: Payer: Self-pay | Admitting: Obstetrics and Gynecology

## 2011-06-09 DIAGNOSIS — B373 Candidiasis of vulva and vagina: Secondary | ICD-10-CM

## 2011-06-09 DIAGNOSIS — N952 Postmenopausal atrophic vaginitis: Secondary | ICD-10-CM

## 2011-06-09 DIAGNOSIS — N898 Other specified noninflammatory disorders of vagina: Secondary | ICD-10-CM

## 2011-06-09 DIAGNOSIS — L293 Anogenital pruritus, unspecified: Secondary | ICD-10-CM

## 2011-06-09 MED ORDER — FLUCONAZOLE 200 MG PO TABS: 200.0000 mg | ORAL_TABLET | Freq: Every day | ORAL | Status: AC

## 2011-06-09 NOTE — Progress Notes (Signed)
Patient came to see me today with a one-week history of vulvar and vaginal itching and vaginal discharge. She says it is even more un comfortable because she is dry vaginally. She has been on a lot of medication for her back and she is going to have major back surgery on the 20th. She does not need vaginal estrogen because she is not sexually active.  External-wnl. Vaginal exam- decrease estrogen effect and yeast on wet prep. Cervix-clean.  Assessment: 1. Yeast vaginitis 2. Atrophic vaginitis  Plan: Diflucan 200 mg daily for 7 days.

## 2011-06-11 ENCOUNTER — Telehealth: Payer: Self-pay | Admitting: Pulmonary Disease

## 2011-06-11 NOTE — Telephone Encounter (Signed)
I advised pt we have she had a study in 2008 and one in April of this year. Pt needed nothing further

## 2011-06-14 ENCOUNTER — Telehealth: Payer: Self-pay

## 2011-06-14 ENCOUNTER — Encounter: Payer: Self-pay | Admitting: *Deleted

## 2011-06-14 ENCOUNTER — Telehealth: Payer: Self-pay | Admitting: Gastroenterology

## 2011-06-14 MED ORDER — TERCONAZOLE 0.8 % VA CREA: TOPICAL_CREAM | VAGINAL | Status: DC

## 2011-06-14 NOTE — Telephone Encounter (Signed)
Pt reports she having fusion of L1-5 with Dr Danielle Dess on 06/24/11. She reports Dr Jarold Motto stop her Nexium in December, 2011 and she's been waking up in the middle of the night with heartburn. Pt reports she's afraid the problem will get worse post recovery. Gave pt an appt tomorrow to discuss possible PPI's.

## 2011-06-14 NOTE — Telephone Encounter (Signed)
PT STATES DIFLUCAN 200MG . #7 YOU GAVE HER AT 06-09-11. SHE STATES RX NOT HELPING AT ALL AND YOU WANTED HER TO CALL YOU IF NO IMPROVEMENT.

## 2011-06-14 NOTE — Telephone Encounter (Signed)
PT NOTIFIED OF DR. G'S NOTE BELOW AND STATES SHE IS ABLE TO INSERT CREAM. I SENT RX TO HER PHARMACY.

## 2011-06-14 NOTE — Telephone Encounter (Signed)
Give her terconazole 3 cream. Tell I hope she can do it since I know she's getting ready for major back surgery.

## 2011-06-15 ENCOUNTER — Encounter (HOSPITAL_COMMUNITY)
Admission: RE | Admit: 2011-06-15 | Discharge: 2011-06-15 | Disposition: A | Discharge status: Home / Self Care | Payer: Medicare Other | Source: Ambulatory Visit | Attending: Neurological Surgery | Admitting: Neurological Surgery

## 2011-06-15 ENCOUNTER — Ambulatory Visit (HOSPITAL_COMMUNITY)
Admission: RE | Admit: 2011-06-15 | Discharge: 2011-06-15 | Disposition: A | Discharge status: Home / Self Care | Payer: Medicare Other | Source: Ambulatory Visit | Attending: Neurological Surgery | Admitting: Neurological Surgery

## 2011-06-15 ENCOUNTER — Other Ambulatory Visit (HOSPITAL_COMMUNITY): Payer: Self-pay | Admitting: Neurological Surgery

## 2011-06-15 ENCOUNTER — Encounter: Payer: Self-pay | Admitting: Gastroenterology

## 2011-06-15 ENCOUNTER — Ambulatory Visit (INDEPENDENT_AMBULATORY_CARE_PROVIDER_SITE_OTHER): Payer: Medicare Other | Admitting: Gastroenterology

## 2011-06-15 VITALS — BP 132/80 | HR 64 | Ht 68.0 in | Wt 193.0 lb

## 2011-06-15 DIAGNOSIS — M48061 Spinal stenosis, lumbar region without neurogenic claudication: Secondary | ICD-10-CM | POA: Insufficient documentation

## 2011-06-15 DIAGNOSIS — Z01812 Encounter for preprocedural laboratory examination: Secondary | ICD-10-CM | POA: Insufficient documentation

## 2011-06-15 DIAGNOSIS — M47817 Spondylosis without myelopathy or radiculopathy, lumbosacral region: Secondary | ICD-10-CM | POA: Insufficient documentation

## 2011-06-15 DIAGNOSIS — K219 Gastro-esophageal reflux disease without esophagitis: Secondary | ICD-10-CM | POA: Insufficient documentation

## 2011-06-15 DIAGNOSIS — K5903 Drug induced constipation: Secondary | ICD-10-CM | POA: Insufficient documentation

## 2011-06-15 DIAGNOSIS — Z01811 Encounter for preprocedural respiratory examination: Secondary | ICD-10-CM | POA: Insufficient documentation

## 2011-06-15 DIAGNOSIS — M4310 Spondylolisthesis, site unspecified: Secondary | ICD-10-CM

## 2011-06-15 DIAGNOSIS — K5909 Other constipation: Secondary | ICD-10-CM

## 2011-06-15 DIAGNOSIS — T50904A Poisoning by unspecified drugs, medicaments and biological substances, undetermined, initial encounter: Secondary | ICD-10-CM

## 2011-06-15 LAB — BASIC METABOLIC PANEL
BUN: 26 mg/dL — ABNORMAL HIGH (ref 6–23)
Chloride: 101 mEq/L (ref 96–112)
Glucose, Bld: 65 mg/dL — ABNORMAL LOW (ref 70–99)
Potassium: 4.1 mEq/L (ref 3.5–5.1)
Sodium: 143 mEq/L (ref 135–145)

## 2011-06-15 LAB — ABO/RH: ABO/RH(D): O POS

## 2011-06-15 LAB — CBC
Hemoglobin: 17.7 g/dL — ABNORMAL HIGH (ref 12.0–15.0)
RBC: 4.91 MIL/uL (ref 3.87–5.11)

## 2011-06-15 LAB — TYPE AND SCREEN: Antibody Screen: NEGATIVE

## 2011-06-15 LAB — SURGICAL PCR SCREEN: MRSA, PCR: NEGATIVE

## 2011-06-15 MED ORDER — ESOMEPRAZOLE MAGNESIUM 40 MG PO CPDR: 40.0000 mg | DELAYED_RELEASE_CAPSULE | Freq: Every day | ORAL | Status: DC

## 2011-06-15 MED ORDER — NA SULFATE-K SULFATE-MG SULF 17.5-3.13-1.6 GM/177ML PO SOLN: ORAL | Status: DC

## 2011-06-15 NOTE — Progress Notes (Addendum)
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 She has neurosurgery scheduled for September 20 with Dr.Ellsner. GI issues revolve around burning substernal chest pain alleviated by PPI therapy. Endoscopy several months ago showed Candida esophagitis, she was treated with Diflucan successfully, but did not restart Nexium. There is no history of significant dysphagia or hepatobiliary complaints. Her main problem at this time is severe constipation related to 4 times a day hydrocodone therapy. She denies significant abdominal rectal pain or rectal bleeding. Her medical care is complicated by many psychosocial issues. She does have a history of chronic anxiety and depression and is on multiple medications. She also has a history of hemochromatosis followed by Dr. Geoffry Paradise, and she relates that her serum ferritin has been chronically less than 50. She is up-to-date on her endoscopy and colonoscopy exams. The patient tried Colace but no MiraLax or other laxatives. Otherwise she denies upper GI or hepatobiliary complaints. Her appetite is good her weight is been stable, and she denies systemic complaints.  Current Medications, Allergies, Past Medical History, Past Surgical History, Family History and Social History were reviewed in [employer] record.  Pertinent Review of Systems Negative.. severe back pain requiring 4 times a day hydrocodone. She is very tearful today because of social problems. She has a sick husband with Parkinson's disease .   Physical Exam: Awake alert no acute distress appears stated age. Her abdomen shows no distention, organomegaly, masses or tenderness. Bowel sounds are normal. Inspection of rectum is unremarkable as is rectal exam. There is a very hard almost impacted stool in the rectal vault which is guaiac negative. No status is clear without gross focal neurological deficits.    Assessment and  Plan: Narcotic-induced severe constipation. I have given her a Super Prep we used for colonoscopy preparation. She is to then began MiraLax 8 ounces at bedtime and twice a day Colace. I have restarted her Nexium 40 mg a day with standard antireflux maneuvers. I have given her information about a new injection as she can receive in the hospital for narcotic constipation. This is a Mu opiod antagonist, and is specific for the rectosigmoid area. Her hospitalization she also should continue on MiraLax and PPI therapy.  Please copy her primary care physician, referring physician, and pertinent subspecialists. Dr. Danielle Dess in neurosurgery and Dr. Geoffry Paradise at [facility]. No diagnosis found.

## 2011-06-15 NOTE — Patient Instructions (Signed)
Your prescription(s) have been sent to you pharmacy.  Buy Miralax OTC and take at bedtime. You were given a Suprep samples today to bowel prep with, please follow the separate instructions. The day after prep please start Miralax.  We gave you the information on the Injectable pain medication: Relistor for your information after your surgery.

## 2011-06-24 ENCOUNTER — Inpatient Hospital Stay (HOSPITAL_COMMUNITY): Admission: RE | Admit: 2011-06-24 | Payer: Medicare Other | Source: Ambulatory Visit | Admitting: Neurological Surgery

## 2011-07-16 ENCOUNTER — Encounter (HOSPITAL_COMMUNITY)
Admission: RE | Admit: 2011-07-16 | Discharge: 2011-07-16 | Disposition: A | Discharge status: Home / Self Care | Payer: Medicare Other | Source: Ambulatory Visit | Attending: Neurological Surgery | Admitting: Neurological Surgery

## 2011-07-16 LAB — BASIC METABOLIC PANEL
BUN: 21 mg/dL (ref 6–23)
Chloride: 94 mEq/L — ABNORMAL LOW (ref 96–112)
Creatinine, Ser: 1.06 mg/dL (ref 0.50–1.10)
GFR calc Af Amer: 63 mL/min — ABNORMAL LOW (ref >90)
GFR calc non Af Amer: 54 mL/min — ABNORMAL LOW (ref >90)

## 2011-07-16 LAB — CBC
MCHC: 36.8 g/dL — ABNORMAL HIGH (ref 30.0–36.0)
MCV: 97.3 fL (ref 78.0–100.0)
Platelets: 325 10*3/uL (ref 150–400)
RDW: 12.4 % (ref 11.5–15.5)
WBC: 7 10*3/uL (ref 4.0–10.5)

## 2011-07-16 LAB — TYPE AND SCREEN: Antibody Screen: NEGATIVE

## 2011-07-16 LAB — SURGICAL PCR SCREEN: MRSA, PCR: NEGATIVE

## 2011-07-19 ENCOUNTER — Inpatient Hospital Stay (HOSPITAL_COMMUNITY): Payer: Medicare Other

## 2011-07-19 ENCOUNTER — Inpatient Hospital Stay (HOSPITAL_COMMUNITY)
Admission: RE | Admit: 2011-07-19 | Discharge: 2011-07-30 | DRG: 460 | Disposition: A | Discharge status: Skilled Nursing Facility | Payer: Medicare Other | Source: Ambulatory Visit | Attending: Neurological Surgery | Admitting: Neurological Surgery

## 2011-07-19 DIAGNOSIS — IMO0002 Reserved for concepts with insufficient information to code with codable children: Secondary | ICD-10-CM | POA: Diagnosis present

## 2011-07-19 DIAGNOSIS — R51 Headache: Secondary | ICD-10-CM | POA: Diagnosis not present

## 2011-07-19 DIAGNOSIS — Z01812 Encounter for preprocedural laboratory examination: Secondary | ICD-10-CM

## 2011-07-19 DIAGNOSIS — M1990 Unspecified osteoarthritis, unspecified site: Secondary | ICD-10-CM | POA: Diagnosis present

## 2011-07-19 DIAGNOSIS — Z87440 Personal history of urinary (tract) infections: Secondary | ICD-10-CM

## 2011-07-19 DIAGNOSIS — M47817 Spondylosis without myelopathy or radiculopathy, lumbosacral region: Principal | ICD-10-CM | POA: Diagnosis present

## 2011-07-19 DIAGNOSIS — Z23 Encounter for immunization: Secondary | ICD-10-CM

## 2011-07-19 DIAGNOSIS — E119 Type 2 diabetes mellitus without complications: Secondary | ICD-10-CM | POA: Diagnosis present

## 2011-07-19 DIAGNOSIS — G4733 Obstructive sleep apnea (adult) (pediatric): Secondary | ICD-10-CM | POA: Diagnosis present

## 2011-07-19 DIAGNOSIS — G609 Hereditary and idiopathic neuropathy, unspecified: Secondary | ICD-10-CM | POA: Diagnosis present

## 2011-07-19 DIAGNOSIS — I10 Essential (primary) hypertension: Secondary | ICD-10-CM | POA: Diagnosis present

## 2011-07-19 DIAGNOSIS — E669 Obesity, unspecified: Secondary | ICD-10-CM | POA: Diagnosis present

## 2011-07-19 DIAGNOSIS — K219 Gastro-esophageal reflux disease without esophagitis: Secondary | ICD-10-CM | POA: Diagnosis present

## 2011-07-19 DIAGNOSIS — M5126 Other intervertebral disc displacement, lumbar region: Secondary | ICD-10-CM | POA: Diagnosis present

## 2011-07-19 DIAGNOSIS — D62 Acute posthemorrhagic anemia: Secondary | ICD-10-CM | POA: Diagnosis not present

## 2011-07-19 DIAGNOSIS — Z79899 Other long term (current) drug therapy: Secondary | ICD-10-CM

## 2011-07-19 LAB — GLUCOSE, CAPILLARY
Glucose-Capillary: 152 mg/dL — ABNORMAL HIGH (ref 70–99)
Glucose-Capillary: 155 mg/dL — ABNORMAL HIGH (ref 70–99)
Glucose-Capillary: 210 mg/dL — ABNORMAL HIGH (ref 70–99)

## 2011-07-19 LAB — POCT I-STAT 4, (NA,K, GLUC, HGB,HCT)
Glucose, Bld: 114 mg/dL — ABNORMAL HIGH (ref 70–99)
HCT: 27 % — ABNORMAL LOW (ref 36.0–46.0)
Hemoglobin: 8.8 g/dL — ABNORMAL LOW (ref 12.0–15.0)
Potassium: 3.7 mEq/L (ref 3.5–5.1)
Potassium: 3 mEq/L — ABNORMAL LOW (ref 3.5–5.1)
Sodium: 156 mEq/L — ABNORMAL HIGH (ref 135–145)

## 2011-07-20 DIAGNOSIS — IMO0002 Reserved for concepts with insufficient information to code with codable children: Secondary | ICD-10-CM

## 2011-07-20 DIAGNOSIS — M47817 Spondylosis without myelopathy or radiculopathy, lumbosacral region: Secondary | ICD-10-CM

## 2011-07-20 LAB — BASIC METABOLIC PANEL
CO2: 30 mEq/L (ref 19–32)
Calcium: 8 mg/dL — ABNORMAL LOW (ref 8.4–10.5)
Creatinine, Ser: 0.95 mg/dL (ref 0.50–1.10)
GFR calc non Af Amer: 62 mL/min — ABNORMAL LOW (ref >90)
Sodium: 142 mEq/L (ref 135–145)

## 2011-07-20 LAB — CBC
MCH: 35 pg — ABNORMAL HIGH (ref 26.0–34.0)
MCHC: 35.1 g/dL (ref 30.0–36.0)
MCV: 99.7 fL (ref 78.0–100.0)
Platelets: 202 10*3/uL (ref 150–400)
RBC: 3.37 MIL/uL — ABNORMAL LOW (ref 3.87–5.11)
RDW: 13 % (ref 11.5–15.5)

## 2011-07-20 LAB — GLUCOSE, CAPILLARY
Glucose-Capillary: 160 mg/dL — ABNORMAL HIGH (ref 70–99)
Glucose-Capillary: 167 mg/dL — ABNORMAL HIGH (ref 70–99)
Glucose-Capillary: 203 mg/dL — ABNORMAL HIGH (ref 70–99)
Glucose-Capillary: 214 mg/dL — ABNORMAL HIGH (ref 70–99)

## 2011-07-21 LAB — GLUCOSE, CAPILLARY
Glucose-Capillary: 115 mg/dL — ABNORMAL HIGH (ref 70–99)
Glucose-Capillary: 161 mg/dL — ABNORMAL HIGH (ref 70–99)

## 2011-07-22 LAB — GLUCOSE, CAPILLARY
Glucose-Capillary: 94 mg/dL (ref 70–99)
Glucose-Capillary: 152 mg/dL — ABNORMAL HIGH (ref 70–99)
Glucose-Capillary: 183 mg/dL — ABNORMAL HIGH (ref 70–99)

## 2011-07-23 LAB — GLUCOSE, CAPILLARY
Glucose-Capillary: 119 mg/dL — ABNORMAL HIGH (ref 70–99)
Glucose-Capillary: 137 mg/dL — ABNORMAL HIGH (ref 70–99)

## 2011-07-24 LAB — URINALYSIS, ROUTINE W REFLEX MICROSCOPIC
Bilirubin Urine: NEGATIVE
Glucose, UA: NEGATIVE mg/dL
Ketones, ur: NEGATIVE mg/dL
Nitrite: NEGATIVE
Protein, ur: NEGATIVE mg/dL
Specific Gravity, Urine: 1.013 (ref 1.005–1.030)
Urobilinogen, UA: 0.2 mg/dL (ref 0.0–1.0)
pH: 7 (ref 5.0–8.0)

## 2011-07-24 LAB — GLUCOSE, CAPILLARY
Glucose-Capillary: 143 mg/dL — ABNORMAL HIGH (ref 70–99)
Glucose-Capillary: 80 mg/dL (ref 70–99)

## 2011-07-24 LAB — URINE MICROSCOPIC-ADD ON

## 2011-07-25 LAB — GLUCOSE, CAPILLARY
Glucose-Capillary: 99 mg/dL (ref 70–99)
Glucose-Capillary: 114 mg/dL — ABNORMAL HIGH (ref 70–99)
Glucose-Capillary: 102 mg/dL — ABNORMAL HIGH (ref 70–99)

## 2011-07-26 LAB — GLUCOSE, CAPILLARY: Glucose-Capillary: 103 mg/dL — ABNORMAL HIGH (ref 70–99)

## 2011-07-27 LAB — GLUCOSE, CAPILLARY
Glucose-Capillary: 119 mg/dL — ABNORMAL HIGH (ref 70–99)
Glucose-Capillary: 151 mg/dL — ABNORMAL HIGH (ref 70–99)
Glucose-Capillary: 117 mg/dL — ABNORMAL HIGH (ref 70–99)

## 2011-07-28 LAB — GLUCOSE, CAPILLARY
Glucose-Capillary: 137 mg/dL — ABNORMAL HIGH (ref 70–99)
Glucose-Capillary: 117 mg/dL — ABNORMAL HIGH (ref 70–99)

## 2011-07-28 NOTE — Discharge Summary (Signed)
  NAMEMEG, NIEMEIER NO.:  [id number]  MEDICAL RECORD NO.:  [id number]  LOCATION:  3001                         FACILITY:  [facility]  PHYSICIAN:  Coletta Memos, M.D.     DATE OF BIRTH:  [birth year]  DATE OF ADMISSION: DATE OF DISCHARGE:                              DISCHARGE SUMMARY   ADDENDUM:  Ms. Kitko' condition has been quite stable since 20 second. She is moving her extremities.  She is going to go to a skilled nursing facility for further rehabilitation and physical therapy.  Wound is clean, dry, no signs of infection.  She is tolerating a regular diet and is able to void.  She will be seen by Dr. Danielle Dess in approximately 3-4 weeks for assessment.          ______________________________ Coletta Memos, M.D.     KC/MEDQ  D:  07/28/2011  T:  07/28/2011  Job:  [id number]  Electronically Signed by Coletta Memos M.D. on 07/28/2011 08:09:13 PM

## 2011-07-29 ENCOUNTER — Inpatient Hospital Stay (HOSPITAL_COMMUNITY): Payer: Medicare Other

## 2011-07-29 LAB — GLUCOSE, CAPILLARY
Glucose-Capillary: 105 mg/dL — ABNORMAL HIGH (ref 70–99)
Glucose-Capillary: 83 mg/dL (ref 70–99)
Glucose-Capillary: 187 mg/dL — ABNORMAL HIGH (ref 70–99)
Glucose-Capillary: 91 mg/dL (ref 70–99)

## 2011-07-30 LAB — GLUCOSE, CAPILLARY: Glucose-Capillary: 96 mg/dL (ref 70–99)

## 2011-08-04 NOTE — Discharge Summary (Signed)
  [name], [name]               ACCOUNT NO.:  [id number]  MEDICAL RECORD NO.:  [id number]  LOCATION:  3001                         FACILITY:  [facility]  PHYSICIAN:  Stefani Dama, M.D.  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  In addition, admission diagnosis includes hypertension, diabetes, hemochromatosis, peripheral neuropathy, and osteoarthritis.  DISCHARGE DIAGNOSES: 1. Spondylosis and spondylolisthesis at L2-3, L3-4, and L4-5 with     herniated nucleus pulposus at L2-3, lumbar spinal stenosis at L2-3     and L4-5 with neurogenic claudication and lumbar radiculopathy. 2. Hypertension. 3. Diabetes mellitus type 2.4. Hemochromatosis. 5. Peripheral neuropathy. 6. Osteoarthritis. 7. Acute blood loss anemia.  CONDITION ON DISCHARGE:  Improving.  HOSPITAL COURSE:  [name] is a 65 year old individual who has been disabled secondary to hemochromatosis associated with significant peripheral neuropathy.  She had been seen previously in 1997.  I had removed the meningioma from the cervical spinal canal.  She also had advanced degenerative changes in 1998, underwent anterior cervical decompression arthrodesis at C5-6 and C6-7.  The patient has had increasing weakness in her lower extremities, initially attributed to peripheral neuropathy related to hemochromatosis.  However, she was found to have advanced spondylitic changes at L2-3 and again at L4-5 with a spondylolisthesis and a severe lumbar spinal stenosis.  She was advised regarding need for surgery to decompress from L2 to the sacrum. She was taken to the operating room on July 19, 2011, where  she underwent this surgery.  Postoperatively, she was noted to have a hemoglobin that decreased to 8-9 g in recovery room.  However, postoperatively on the third day, it was increased to 11.4.  This was still down substantially from her hemoglobin of 15.  The diagnosis was consistent with acute blood loss anemia, and she had over 1000 mL blood loss in the operating room.  She was gradually mobilized; however, she complained of significant headache, and there was some concern that she may have had a spinal fluid leak.  This did not persist and gradually, she over came her headache with conservative management.  Because the patient lives alone, has a disabled husband who is already in placement. It has been advised that she should undergo some further rehabilitation and convalescence.  This is being arranged at the current time.  The patient will spend approximately 1-2 weeks in a convalescent center.  Her incision is clean and dry at the time of discharge.  Condition on discharge is improving.     Stefani Dama, M.D.     Merla Riches  D:  07/23/2011  T:  07/24/2011  Job:  [id number]  Electronically Signed by Barnett Abu M.D. on 08/04/2011 07:10:59 AM

## 2011-08-04 NOTE — Op Note (Signed)
[name], [name]               ACCOUNT NO.:  [id number]  MEDICAL RECORD NO.:  [id number]  LOCATION:  3001                         FACILITY:  [facility]  PHYSICIAN:  Stefani Dama, M.D.  DATE OF BIRTH:  02-07-1946  DATE OF PROCEDURE:  07/19/2011 DATE OF DISCHARGE:                              OPERATIVE REPORT   PREOPERATIVE DIAGNOSES:  Spondylosis and spondylolisthesis at L2-L3 L3- 4, and L4-5 with herniated nucleus pulposus L2-3, lumbar spinal stenosis L2-3 and L4-5 with neurogenic claudication and lumbar radiculopathy.  POSTOPERATIVE DIAGNOSES:  Spondylosis and spondylolisthesis at L2-L3 L3- 4, and L4-5 with herniated nucleus pulposus L2-3, lumbar spinal stenosis L2-3 and L4-5 with neurogenic claudication and lumbar radiculopathy.  OPERATION:  Decompression of the L2-L3, L4, and L5 nerve roots via laminectomy at L2-L3, L4, and L5, with dissecting greater than required for simple posterior interbody arthrodesis, posterior lumbar interbody arthrodesis using PEEK spacers, autograft and allograft at L2-3 L3-4, and L4-5 and L5-S1, segmental fixation L2 to the sacrum with pedicle screws, posterolateral arthrodesis at L2-L5.  SURGEON:  Stefani Dama, MD  FIRST ASSISTANT:  Hilda Lias, MD  ANESTHESIA:  General endotracheal.  INDICATIONS:  [name] is a 65 year old individual had significant back and left lower extremity pain.  She has had also progressive difficulty with lumbar stenosis and neurogenic claudication, such that she can only walk short distances.  Finding increasingly difficult to do her activities of daily living, and workup included myelogram which demonstrated presence of severe spondylitic stenosis at L2-L3 secondary to a large extruded fragment of disk in addition to advanced spondylitic changes with secondary stenosis that was severe at L4-L5 with a spondylolisthesis.  She was advised regarding need for surgical decompression arthrodesis at each of the  levels at L2-L5 and the sacrum and was taken to the operating room for this procedure.  PROCEDURE:  The patient was brought to the operating room, supine on the stretcher.  After smooth induction of general endotracheal anesthesia, she was turned prone.  The back was prepped with alcohol and DuraPrep draped in a sterile fashion.  Midline incision was created and carried down to the lumbodorsal fascia which was opened on either side of midline, and the spinous processes and laminar arches from L2-L5 were exposed.  The sacrum was identified positively by palpation.  Then, limited inferior facetectomy at L5 was performed to expose the sacral entry sites and pedicle screws were then placed under fluoroscopic guidance at L2-L3, L4, and L5.  At L2, we placed a 5.5 x 45 mm pedicle screws, and then at L3, we placed a 5.5 x 35 mm screws as we did at L4 and L5.  Also these were done using a medial to lateral technique from the inferior portion of the pars.  Once the screws were placed there, we placed 6.5 x 45 mm pedicle screws into the sacral ala.  Once these were placed, then laminectomy was performed removing the entire lamina of L5- L4, L3 and then the doing a modified laminectomy removing the entire inferior articular process of the facet at L2.  This exposed the common dural tube in the yellow ligament.  The ligament was then taken up and dissection  was carried out laterally to expose the common dural tube, and then sequentially decompressed the L2 nerve roots most superiorly, the L3 nerve root inferiorly, the L4 nerve root, the L5 nerve root, and the sacral nerve roots.  This was done carefully with a combination of curettes rongeurs, high-speed drill, and care being taken to protect the nerve root in all aspects of the decompression.  The disk spaces were then isolated at each level at L2-3, L3-4, L4-5 and L5-1.  Starting at L2-3 we then performed bilateral diskectomy and total diskectomy  was performed, which allowed for distraction of the interspace, and at L2-3 10 mm PEEK interbody spacers were placed bilaterally with autograft and allograft being placed into the interspace along with some of Vitoss bone sponge.  At L3, 12 mm spacers were placed with using a similar construct at L4, a similar construct was carried out and L5 again 12 mm spacers were placed bilaterally with a significant quantity of the autograft from the laminar arches that had been harvested being placed in the interspace.  Each interspace was decorticated using a series of curettes and rongeurs to make sure that all the endplate material had been removed thoroughly.  Once the posterior interbody fusion was completed, we replaced pedicle screws from L2 down to the sacrum and decorticated the lateral gutters and decorticated the area over the facet joints that had remained and this was then packed with Vitoss and some of the autograft.  120 mm rods were then used to connect the screws together from L2 to the sacrum and these were tightened into position. Final radiographs were obtained in AP and lateral projection, it was noted to be a small spinal fluid leak from the inferior margin of the right lateral aspect of the dura.  This had occurred during retraction and placement of the posterior interbody spacer.  This was closed with singular 6-0 Prolene suture and area was covered with DuraSeal.  No spinal fluid was noted to emanate after this was performed to a Valsalva 40 cm of water.  With this, the retractors were removed.  The area was inspected carefully.  Each of the exiting nerve roots was found to be free and clear in its travel out the foramen, and then the lumbodorsal fascia was closed with number 1 Vicryl interrupted fashion, 2-0 Vicryl in the subcutaneous tissues, 3-0 Vicryl subcuticularly and Dermabond on the skin.  Blood loss for the procedure was estimated at 1600 mL, 550 mL cell saver blood  was returned to the patient.  She tolerated the procedure well.     Stefani Dama, M.D.     Merla Riches  D:  07/22/2011  T:  07/23/2011  Job:  [id number]  Electronically Signed by Barnett Abu M.D. on 08/04/2011 07:10:50 AM

## 2011-08-04 NOTE — H&P (Signed)
[name], [name]               ACCOUNT NO.:  [id number]  MEDICAL RECORD NO.:  [id number]  LOCATION:  3001                         FACILITY:  [facility]  PHYSICIAN:  Stefani Dama, M.D.  DATE OF BIRTH:  1946-01-02  DATE OF ADMISSION:  07/19/2011 DATE OF DISCHARGE:                             HISTORY & PHYSICAL   ADMITTING DIAGNOSES:  Lumbar spondylosis and stenosis with lumbar spondylolisthesis, L4-5; high-grade stenosis at L2-3 secondary to herniated nucleus pulposus, and neurogenic claudication with lumbar radiculopathy.  HISTORY OF PRESENT ILLNESS:  [name] is a disabled 65 year old female who was evaluated for chronic back pain back in July of this year.  She has been getting worse over a 6-8 months period of time, though she has had back pain for several years period of time.  She has had previous surgery with a 2 level anterior cervical discectomy that was performed sometime in 2000 and then she had removal of meningioma in 1997.  Her ACDF was in 1998 at C5-6 and C6-C7.  She had been doing quite well in regard to her cervical spine.  She has some radiculopathy in her right lower extremity, which has been to becoming increasingly worse, and she notes that she has had some evidence for foot drop with weakness in the right lower extremity, and she was treated with conservative efforts including several epidural steroid injections.  However, most recently, she underwent a myelogram, which demonstrated a high-grade stenosis at L2-L3 with a large extruded fragmented disk at that level in addition to advanced spondylitic stenosis at L4-L5 where she had spondylolisthesis with bilateral nerve entrapment.  She is advised ultimately as she would require surgery to decompress from L2 to the sacrum with posterior interbody arthrodesis at each of these levels. She is admitted now for this procedure.  PAST MEDICAL HISTORY:  Positive for hypertension, diabetes, GI  problems, hemochromatosis, peripheral neuropathy, and osteoarthritis.  FAMILY HISTORY:  Notable that there was a history of cancer and heart problems, diabetes, hypertension, melanoma, lung cancer, hemochromatosis, and neuropathy that run in her family.  PREVIOUS SURGERIES:  Foot surgery in August 2010, ACDF at C5-6 and C6-7 in 1998, removal of a meningioma of the cervical spinal canal in 1997, endometriosis, tubal ligation, sinus surgery in 1993, appendectomy in 1988, and cholecystectomy in 1997.  MEDICATIONS:  Glimepiride, metoprolol, hydrochlorothiazide, Cymbalta, Lyrica, valacyclovir, minocycline, lamotrigine, buspirone, Celebrex, Caltrate, multivitamins, furosemide, Klor-Con, hydrocodone, and levothyroxine.  Systems review is positive on the 14-point review sheet, use of glasses, use of a hearing aid and hearing loss, balance disturbance, nasal congestion and drainage, sinus problems, sinus headaches, hypertension, swelling in her feet and hand, leg pain while walking, neck pain, arthritis, joint pain and swelling, arm pain, leg pain and back pain are also noted, ulcers and gastritis, indigestion, and difficulty with her memory is also noted, inability to concentrate diabetes and thyroid disease and hemochromatosis all noted on the 14-point review sheet.  No known drug allergies at present.  SOCIAL HISTORY:  Reveals that the patient does not use tobacco or alcohol.  She has been living with her husband who has become progressively dysfunctional because of parkinsonism, and recently had to be placed.  PHYSICAL EXAMINATION:  GENERAL:  Reveals lungs are clear to auscultation. HEART:  Regular rate and rhythm.  No murmurs noted. ABDOMEN:  Soft.  Bowel sounds positive.  No masses are identified. EXTREMITIES:  Reveal no cyanosis, clubbing, or edema. NEUROLOGIC:  [name] is a well-developed, modestly obese individual who is alert and oriented x3.  Her cranial nerves reveal that her  pupils are 3 mm, briskly reactive to light and accommodation.  Extraocular movements are full.  Face is symmetric to grimace.  Tongue and uvula are in the midline.  Sclerae and conjunctivae are clear.  Sensation about the face is symmetric.  Motor strength reveals the upper extremity strength to be full and intact without evidence of any defect. Her reflexes are 2+ in the biceps and trace in the triceps. In the lower extremities, I note that she has a modest foot drop on the right side with weakness of the extensor hallucis longus also noted to be at 3/5. No atrophy is noted in the major groups in her lower extremities.  Deep tendon reflexes, there are 2+ in the patellae, absent in both Achilles. Babinski reflexes are downgoing.  The patient's gait is slow, very deliberate. She does not have a widened base.  She is able do rapid alternating movements, and her Romberg test is negative.  IMPRESSION:  The patient has evidence of a spondylolisthesis with a large centrally herniated disk at L2-L3.  She is to undergo surgical decompression and stabilization from L2 to the sacrum and is now admitted for this process.     Stefani Dama, M.D.     [name]  D:  07/22/2011  T:  07/23/2011  Job:  [id number]  Electronically Signed by Barnett Abu M.D. on 08/04/2011 07:10:55 AM

## 2011-09-02 ENCOUNTER — Telehealth: Payer: Self-pay | Admitting: Gastroenterology

## 2011-09-03 MED ORDER — ESOMEPRAZOLE MAGNESIUM 40 MG PO CPDR: 40.0000 mg | DELAYED_RELEASE_CAPSULE | Freq: Every day | ORAL | Status: DC

## 2011-09-03 NOTE — Telephone Encounter (Signed)
rx sent

## 2011-09-07 ENCOUNTER — Ambulatory Visit (INDEPENDENT_AMBULATORY_CARE_PROVIDER_SITE_OTHER): Payer: Medicare Other | Admitting: Obstetrics and Gynecology

## 2011-09-07 DIAGNOSIS — R300 Dysuria: Secondary | ICD-10-CM

## 2011-09-07 DIAGNOSIS — L293 Anogenital pruritus, unspecified: Secondary | ICD-10-CM

## 2011-09-07 DIAGNOSIS — B373 Candidiasis of vulva and vagina: Secondary | ICD-10-CM

## 2011-09-07 DIAGNOSIS — R82998 Other abnormal findings in urine: Secondary | ICD-10-CM

## 2011-09-07 DIAGNOSIS — N390 Urinary tract infection, site not specified: Secondary | ICD-10-CM

## 2011-09-07 DIAGNOSIS — N898 Other specified noninflammatory disorders of vagina: Secondary | ICD-10-CM

## 2011-09-07 MED ORDER — TERCONAZOLE 0.8 % VA CREA: 1.0000 | TOPICAL_CREAM | Freq: Every day | VAGINAL | Status: AC

## 2011-09-07 NOTE — Progress Notes (Signed)
Patient came back today complaining of a lot at discomfort inside her vagina. She said almost felt like she had had very painful intercourse. She had been on antibiotics after her back surgery and thinking this was a yeast infection had treated her self with  terconazole 3 cream for 2 nights and was feeling better. She was also having some dysuria and frequency and has slightly abnormal urinalysis. She's had no vaginal bleeding.  Exam: Kennon Portela present. Abdomen is soft without guarding rebound or masses. Pelvic exam was entirely within normal limits. Wet prep is positive for yeast.  Assessment: #1. Yeast vaginitis #2. Urinary tract infection  Plan: Patient will continue terconazole 3 cream for 4 more days. Urine culture ordered. I was somewhat concerned that the patient's symptoms seem to a been there even previously and I wondered about some occult pelvic mass. I've asked to return in 4 weeks to be sure the yeast infections gone and if her discomfort persists I think she needs a pelvic ultrasound.

## 2011-10-07 ENCOUNTER — Ambulatory Visit (INDEPENDENT_AMBULATORY_CARE_PROVIDER_SITE_OTHER): Payer: Medicare Other | Admitting: Obstetrics and Gynecology

## 2011-10-07 DIAGNOSIS — R300 Dysuria: Secondary | ICD-10-CM

## 2011-10-07 DIAGNOSIS — N899 Noninflammatory disorder of vagina, unspecified: Secondary | ICD-10-CM

## 2011-10-07 DIAGNOSIS — N898 Other specified noninflammatory disorders of vagina: Secondary | ICD-10-CM

## 2011-10-07 LAB — URINALYSIS, ROUTINE W REFLEX MICROSCOPIC
Hgb urine dipstick: NEGATIVE
Leukocytes, UA: NEGATIVE
Nitrite: NEGATIVE
Protein, ur: NEGATIVE mg/dL
pH: 5 (ref 5.0–8.0)

## 2011-10-07 LAB — WET PREP, GENITAL
Trich, Wet Prep: NONE SEEN
WBC, Wet Prep HPF POC: NONE SEEN
Yeast Wet Prep HPF POC: NONE SEEN

## 2011-10-07 MED ORDER — TERCONAZOLE 0.8 % VA CREA: 1.0000 | TOPICAL_CREAM | Freq: Every day | VAGINAL | Status: AC

## 2011-10-07 NOTE — Progress Notes (Signed)
Patient came to see me today for further followup. I had asked her to come back after I treated  her yeast infection because of the extent of her discomfort made  me worry about a more serious pelvic problem. When she took the terconazole she got much better. At this point she realized that the medicine she been taken for rosacea was minocin so she stopped it and was better until she took amoxicillin for recurrent Toe problem. She now has vaginal irritation again but not to the extent she had previously.  Exam: External within normal limits. BUS within normal limits. Vaginal exam consistent with yeast. Wet prep obtained.  Assessment: Recurrent yeast vaginitis related to recurrent use of antibiotics.  Plan: Treated with terconazole 3 cream with 2 refills. Patient hopefully will be able to stay off of antibiotics. We will call her if wet prep shows something else.

## 2011-10-18 ENCOUNTER — Other Ambulatory Visit: Payer: Self-pay | Admitting: Obstetrics and Gynecology

## 2011-10-18 DIAGNOSIS — Z1231 Encounter for screening mammogram for malignant neoplasm of breast: Secondary | ICD-10-CM

## 2011-10-19 ENCOUNTER — Telehealth: Payer: Self-pay | Admitting: *Deleted

## 2011-10-19 DIAGNOSIS — R102 Pelvic and perineal pain: Secondary | ICD-10-CM

## 2011-10-19 MED ORDER — KETOCONAZOLE 200 MG PO TABS: ORAL_TABLET | ORAL | Status: DC

## 2011-10-19 NOTE — Telephone Encounter (Signed)
Pt has recurrent yeast infection, Ov on 10/07/11 given Terazol 3 day cream she took all cream and did 1 refill of cream. Pt said that yeast is "driving me crazy". Pt said that she took a shower three times last night because of discomfort, she is not getting any sleep because of the extreme discomfort. Pt said that you spoke about a possible ultrasound if no improvement? Please advise

## 2011-10-19 NOTE — Telephone Encounter (Signed)
Schedule patient for pelvic ultrasound. Also treat her with Nizoral 200 mgm b.i.d. For 7 days.

## 2011-10-19 NOTE — Telephone Encounter (Signed)
Pt informed with the below note, rx sent and order for ultrasound in computer

## 2011-10-22 ENCOUNTER — Other Ambulatory Visit: Payer: Medicare Other

## 2011-10-22 ENCOUNTER — Ambulatory Visit (INDEPENDENT_AMBULATORY_CARE_PROVIDER_SITE_OTHER): Payer: Medicare Other

## 2011-10-22 ENCOUNTER — Ambulatory Visit (INDEPENDENT_AMBULATORY_CARE_PROVIDER_SITE_OTHER): Payer: Medicare Other | Admitting: Obstetrics and Gynecology

## 2011-10-22 ENCOUNTER — Ambulatory Visit: Payer: Medicare Other | Admitting: Obstetrics and Gynecology

## 2011-10-22 DIAGNOSIS — R102 Pelvic and perineal pain: Secondary | ICD-10-CM

## 2011-10-22 DIAGNOSIS — N898 Other specified noninflammatory disorders of vagina: Secondary | ICD-10-CM

## 2011-10-22 DIAGNOSIS — B373 Candidiasis of vulva and vagina: Secondary | ICD-10-CM

## 2011-10-22 DIAGNOSIS — N895 Stricture and atresia of vagina: Secondary | ICD-10-CM

## 2011-10-22 DIAGNOSIS — N949 Unspecified condition associated with female genital organs and menstrual cycle: Secondary | ICD-10-CM

## 2011-10-22 NOTE — Progress Notes (Signed)
The patient came back today for ultrasound because of severe pelvic pressure making is concerned about a pelvic mass. She's also had a lot of trouble with yeast vaginitis but has finally responded to Nizoral which she is currently on. She is a mild diabetic.  Ultrasound: Uterus is completely normal. Endometrial echo was 1.8 mm. Ovaries were completely normal. Cul-de-sac was free of fluid.  Assessment: Persistent yeast infection. Lower pelvic pressure.  Plan: Patient reassured about ultrasound. Patient feeling much better now. Finished Nizoral. Patient saw her PCP today and diabetes well-controlled.

## 2011-11-04 ENCOUNTER — Ambulatory Visit
Admission: RE | Admit: 2011-11-04 | Discharge: 2011-11-04 | Disposition: A | Discharge status: Home / Self Care | Payer: Medicare Other | Source: Ambulatory Visit | Attending: Obstetrics and Gynecology | Admitting: Obstetrics and Gynecology

## 2011-11-04 DIAGNOSIS — Z1231 Encounter for screening mammogram for malignant neoplasm of breast: Secondary | ICD-10-CM

## 2011-11-18 ENCOUNTER — Other Ambulatory Visit: Payer: Self-pay | Admitting: Dermatology

## 2011-11-19 ENCOUNTER — Ambulatory Visit (INDEPENDENT_AMBULATORY_CARE_PROVIDER_SITE_OTHER): Payer: Medicare Other | Admitting: Obstetrics and Gynecology

## 2011-11-19 ENCOUNTER — Other Ambulatory Visit (HOSPITAL_COMMUNITY)
Admission: RE | Admit: 2011-11-19 | Discharge: 2011-11-19 | Disposition: A | Discharge status: Home / Self Care | Payer: Medicare Other | Source: Ambulatory Visit | Attending: Obstetrics and Gynecology | Admitting: Obstetrics and Gynecology

## 2011-11-19 ENCOUNTER — Encounter: Payer: Self-pay | Admitting: Obstetrics and Gynecology

## 2011-11-19 VITALS — BP 130/78 | Ht 67.0 in | Wt 200.0 lb

## 2011-11-19 DIAGNOSIS — G5600 Carpal tunnel syndrome, unspecified upper limb: Secondary | ICD-10-CM | POA: Insufficient documentation

## 2011-11-19 DIAGNOSIS — Z124 Encounter for screening for malignant neoplasm of cervix: Secondary | ICD-10-CM | POA: Insufficient documentation

## 2011-11-19 DIAGNOSIS — B3731 Acute candidiasis of vulva and vagina: Secondary | ICD-10-CM

## 2011-11-19 DIAGNOSIS — N871 Moderate cervical dysplasia: Secondary | ICD-10-CM

## 2011-11-19 DIAGNOSIS — B373 Candidiasis of vulva and vagina: Secondary | ICD-10-CM

## 2011-11-19 DIAGNOSIS — B009 Herpesviral infection, unspecified: Secondary | ICD-10-CM

## 2011-11-19 DIAGNOSIS — N952 Postmenopausal atrophic vaginitis: Secondary | ICD-10-CM

## 2011-11-19 NOTE — Progress Notes (Signed)
Patient came to see me today for further followup. We have had problems clearing up her yeast infection. It appears that we've been successful with Nizoral. She is having no vaginal bleeding. She is having no pelvic pain. She is up-to-date on mammograms and bone densities. 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 She is not having any reoccurrences. 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 At the moment is tolerable without medication. She is now 3 months after multiple back fusions and seems to be recuperating well.  ROS: Pertinent positives above. Other positives include history of meningioma, hemachromatosis, peripheral neuropathy, hypertension, GERD, IBS,recurrent UTIs.  Physical examination: [name] gardner present. HEENT within normal limits. Neck: Thyroid not large. No masses. Supraclavicular nodes: not enlarged. Breasts: Examined in both sitting midline position. No skin changes and no masses. Abdomen: Soft no guarding rebound or masses or hernia. Pelvic: External: Within normal limits. BUS: Within normal limits. Vaginal:within normal limits. Poor estrogen effect. No evidence of cystocele rectocele or enterocele. Cervix: clean. Uterus: Normal size and shape. Adnexa: No masses. Rectovaginal exam: Confirmatory and negative. Extremities: Within normal limits.  Assessment: #1. Recurrent yeast vaginitis #2. CIN #3. Atrophic vaginitis #4. HSV  Plan: Continue Valtrex. Continue yearly mammograms. Discussed using gynazole if yeast reoccurs.

## 2012-03-23 ENCOUNTER — Encounter: Payer: Self-pay | Admitting: Cardiology

## 2012-03-31 ENCOUNTER — Telehealth: Payer: Self-pay | Admitting: *Deleted

## 2012-03-31 MED ORDER — VALACYCLOVIR HCL 500 MG PO TABS: 500.0000 mg | ORAL_TABLET | Freq: Every day | ORAL | Status: DC

## 2012-03-31 NOTE — Telephone Encounter (Signed)
Pt called requesting 90 day supply of her valtrex 500 mg tablet 1 po daily, new mail order pharmacy. rx sent.

## 2012-04-04 ENCOUNTER — Encounter: Payer: Self-pay | Admitting: Gastroenterology

## 2012-04-04 ENCOUNTER — Ambulatory Visit (INDEPENDENT_AMBULATORY_CARE_PROVIDER_SITE_OTHER): Payer: Medicare Other | Admitting: Gastroenterology

## 2012-04-04 VITALS — BP 132/80 | HR 68 | Ht 68.0 in | Wt 190.0 lb

## 2012-04-04 DIAGNOSIS — R110 Nausea: Secondary | ICD-10-CM

## 2012-04-04 DIAGNOSIS — Z8719 Personal history of other diseases of the digestive system: Secondary | ICD-10-CM

## 2012-04-04 DIAGNOSIS — R51 Headache: Secondary | ICD-10-CM

## 2012-04-04 MED ORDER — HYDROCODONE-ACETAMINOPHEN 5-325 MG PO TABS: 1.0000 | ORAL_TABLET | Freq: Four times a day (QID) | ORAL | Status: AC | PRN

## 2012-04-04 MED ORDER — METOCLOPRAMIDE HCL 10 MG PO TABS: ORAL_TABLET | ORAL | Status: DC

## 2012-04-04 NOTE — Patient Instructions (Addendum)
We have sent in your prescriptions to your pharmacy Keep your appointment with your PCP We are giving you Nexium samples today We have given you a printed prescription of Norco today

## 2012-04-04 NOTE — Progress Notes (Signed)
This is a very complicated 66 year old Caucasian female with severe fibromyalgia. She has had 5 days of severe headaches associated nausea and inability. She is chronically on Nexium 40 mg a day for acid reflux, this medication was apparently denied. By her healthcare plan. She was recently placed on Savella which is a serotonin/nor epinephrine reuptake inhibitor, and she feels that this may precipitated her nausea and headaches. She has been using when necessary Zofran 4 mg, and apparently has run out of Norco he takes for variety of different pains. She is followed by Dr. Dareen Piano in rheumatology, had neurosurgery of her back 2 years ago, and has appointment to see a neurologist tomorrow. I can really not get too many other gastrointestinal symptoms from this patient, and I previously had seen her for severe constipation related to her narcotics, but apparently this is not a problem at this time. She denies painful swallowing, dysphagia, or any hepatobiliary complaints.  Current Medications, Allergies, Past Medical History, Past Surgical History, Family History and Social History were reviewed in [employer] record.  Pertinent Review of Systems Negative   Physical Exam: Somewhat histrionic patient in no acute distress. Blood pressure 130/80, pulse 60 and regular, weight under 90 pounds. I cannot appreciate stigmata of chronic liver disease. No gross neurological deficits. Mental status normal. Chest is clear and she is in a regular rhythm without murmurs gallops or rubs. Abdominal exam shows no organomegaly, masses or tenderness. Bowel sounds are normal. Peripheral extremities are normal without edema or phlebitis or swollen joints.    Assessment and Plan: New-onset headaches with associated nausea. This patient has a long history of GERD, and I suspect her nausea is related to her neurological issues. She has appointment to see neurology tomorrow, I have given her Nexium samples  40 mg, we'll let her try Reglan 10 mg every 6-8 hours with warnings about any neurological side effects, and given her renewal of Norco 5-325 every 6-8 hours for pain #25 with no refills. This patient sees multiple physicians, Dr. Geoffry Paradise apparently coordinates her problems and primary care. Additional problems include mild abnormal set diabetes, hypertension, and apparently severe fibromyalgia. I do not think she needs repeat endoscopy at this time if not done, she will probably need head CT scan performed.  Please copy her primary care physician, referring physician, and pertinent subspecialists... Dr. Geoffry Paradise, Dr. Dareen Piano in rheumatology, and her neurologist and neurosurgeon. No diagnosis found.

## 2012-04-26 ENCOUNTER — Telehealth: Payer: Self-pay | Admitting: *Deleted

## 2012-04-26 MED ORDER — BUTOCONAZOLE NITRATE (1 DOSE) 2 % VA CREA: 5.0000 g | TOPICAL_CREAM | VAGINAL | Status: DC

## 2012-04-26 NOTE — Telephone Encounter (Signed)
TERAZOL 3 DAY CREAM CALLED IN, PHARMACY DIDN'T HAVE GYNAZOLE NEITHER DID ANOTHER PHARMACY. PT HAS HAD TERAZOL BEFORE.

## 2012-04-26 NOTE — Telephone Encounter (Signed)
PT CALLED C/O YEAST INFECTION ITCHING, WHITE DISCHARGE, IRRITATION, VAGINAL BURNING. PER DR.G OFFICE NOTE OKAY TO SEND GYNAZOLE TO PHARMACY IF USE REOCCURS. RX SENT, PT OKAY WITH THIS.

## 2012-07-14 ENCOUNTER — Telehealth: Payer: Self-pay | Admitting: Gastroenterology

## 2012-07-14 MED ORDER — OMEPRAZOLE 40 MG PO CPDR: 40.0000 mg | DELAYED_RELEASE_CAPSULE | Freq: Every day | ORAL | Status: AC

## 2012-07-14 NOTE — Telephone Encounter (Signed)
Pts insurance company will not cover Nexium. Pt is requesting that a different PPI be called in for her. Dr. Jarold Motto please advise.

## 2012-07-14 NOTE — Telephone Encounter (Signed)
Called and left message for pt to call back. Pt needs to call her Ins. Company and see what PPI they will cover.  Pt states omeprazole is the lowest level on her formulary. Rx sent to pharmacy.

## 2012-07-14 NOTE — Telephone Encounter (Signed)
Whatever PPI her insurance plan will cover is fine

## 2012-11-01 IMAGING — RF DG MYELOGRAM LUMBAR
12 of 18 series · 12 of 18 positions shown · IV contrast (omnipaque)
Comparison: MRI lumbar spine 05/02/2006.

MYELOGRAM LUMBAR

CLINICAL DATA: 65-year-old female with low back pain radiating to
the right lower extremity with swelling.
TECHNIQUE: Intrathecal contrast was administered by Dr. Kangasalam
Papitin  via lumbar puncture at the L3-L4 level. Following injection
of intrathecal Omnipaque contrast, spine imaging in multiple
projections was performed using fluoroscopy.

Fluoroscopy Time: 1.3 minutes.
TECHNIQUE: CT imaging of the lumbar spine was performed after
intrathecal contrast administration.  Multiplanar CT image
reconstructions were also generated.

[Series 1: run · 1 of 1 slices shown (1 of 12)]
[im 1/1]
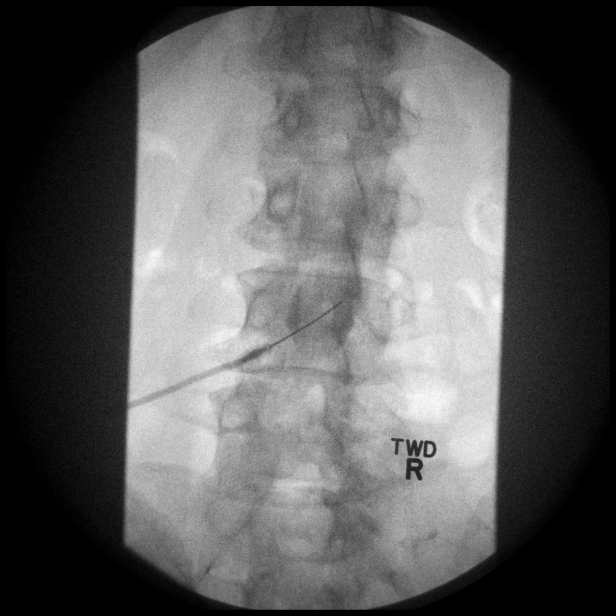

[Series 2: run · 1 of 1 slices shown (2 of 12)]
[im 1/1]
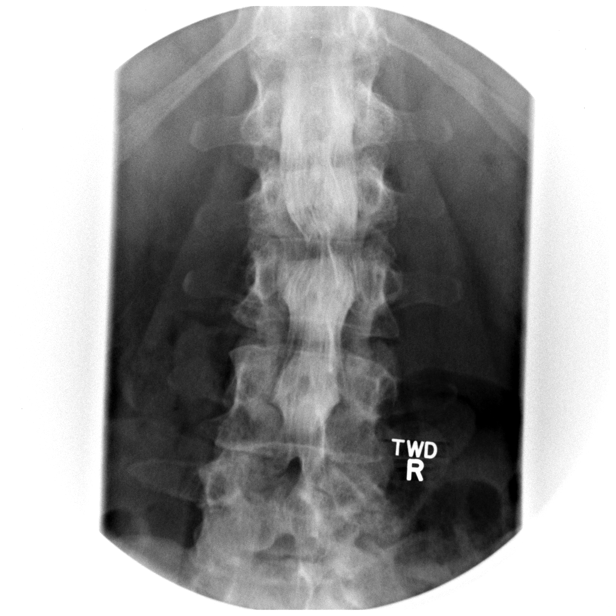

[Series 2: run · 1 of 1 slices shown (3 of 12)]
[im 1/1]
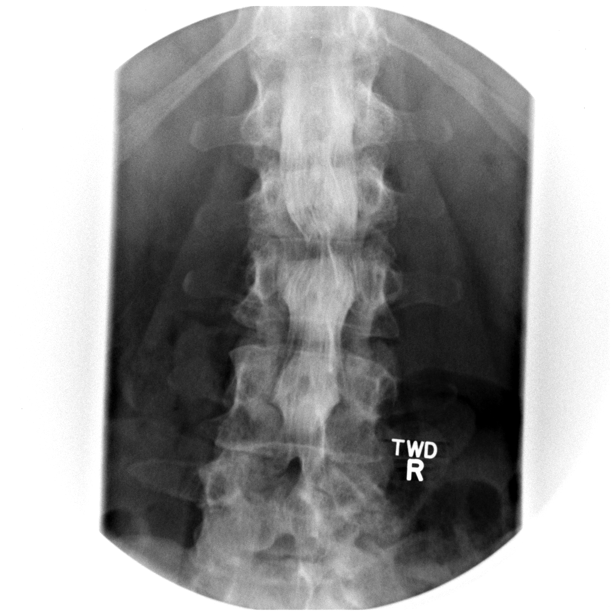

[Series 3: run · 1 of 1 slices shown (4 of 12)]
[im 1/1]
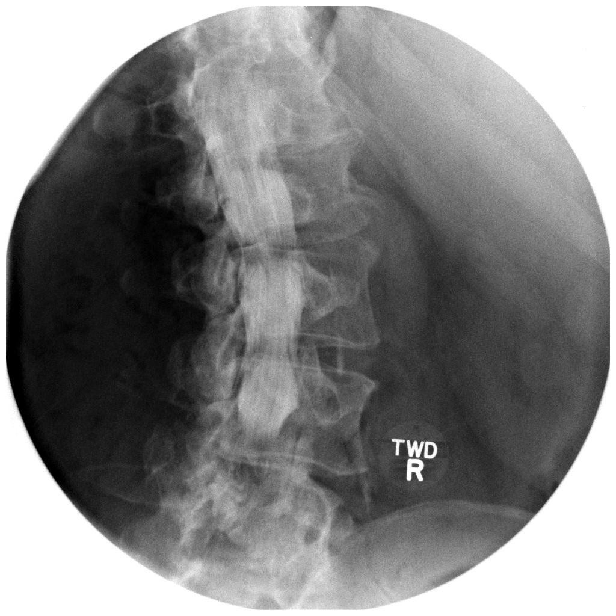

[Series 4: run · 1 of 1 slices shown (5 of 12)]
[im 1/1]
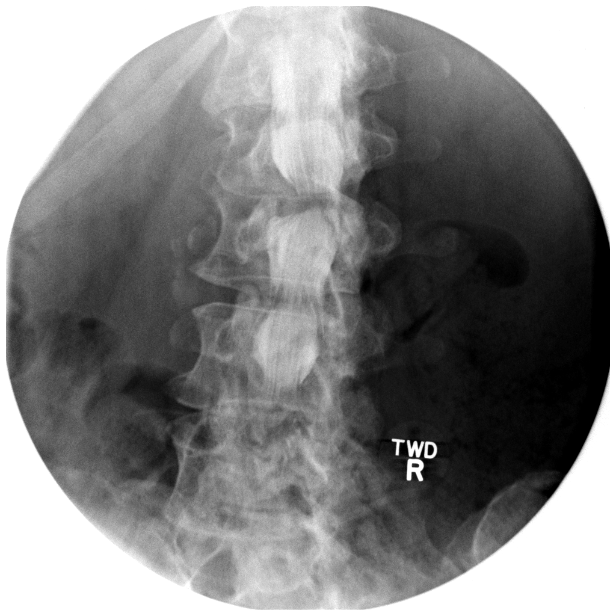

[Series 5: run · 1 of 1 slices shown (6 of 12)]
[im 1/1]
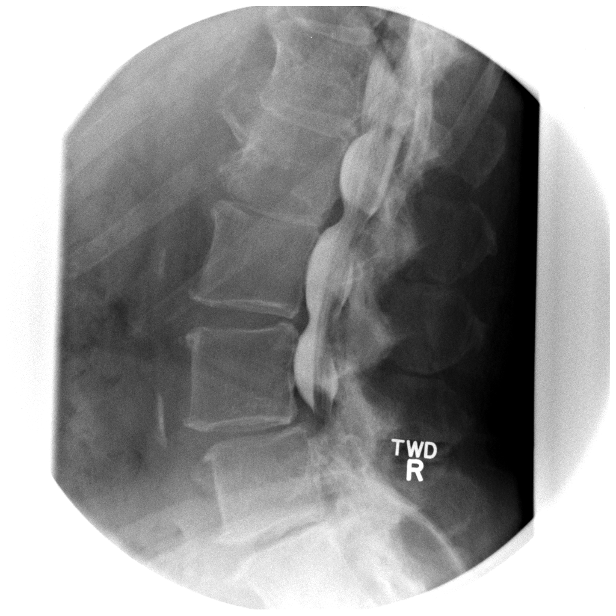

[Series 5: run · 1 of 1 slices shown (7 of 12)]
[im 1/1]
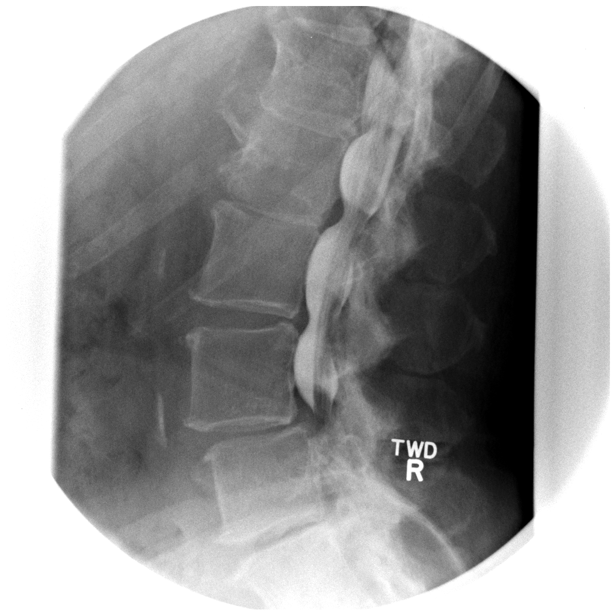

[Series 6: run · 1 of 1 slices shown (8 of 12)]
[im 1/1]
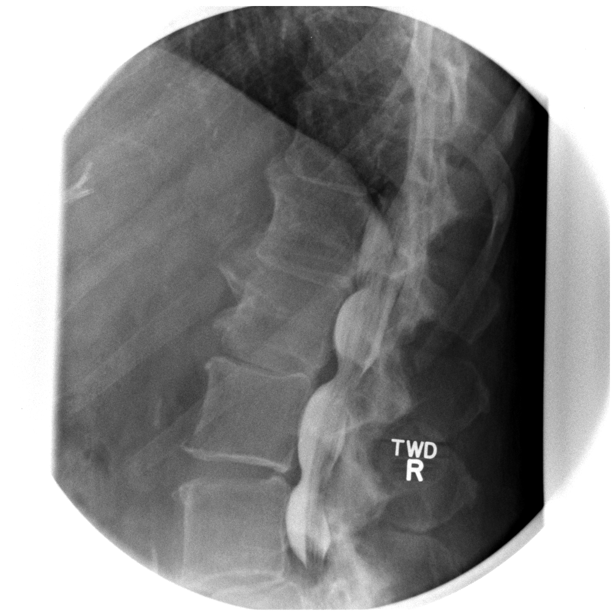

[Series 7: run · 1 of 1 slices shown (9 of 12)]
[im 1/1]
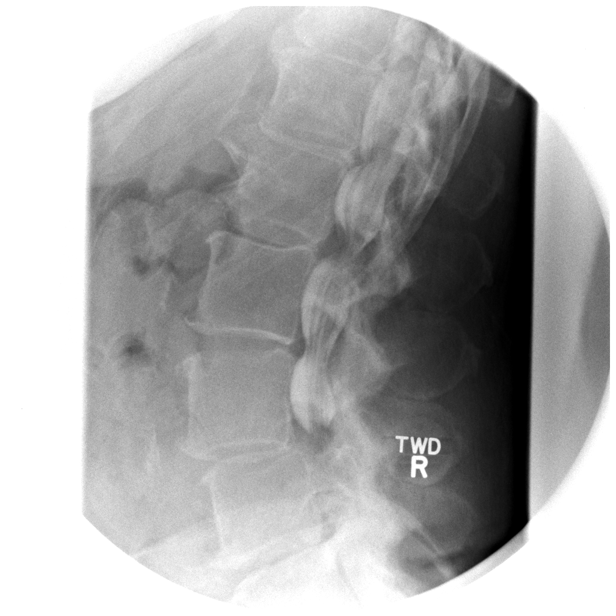

[Series 8: run · 1 of 1 slices shown (10 of 12)]
[im 1/1]
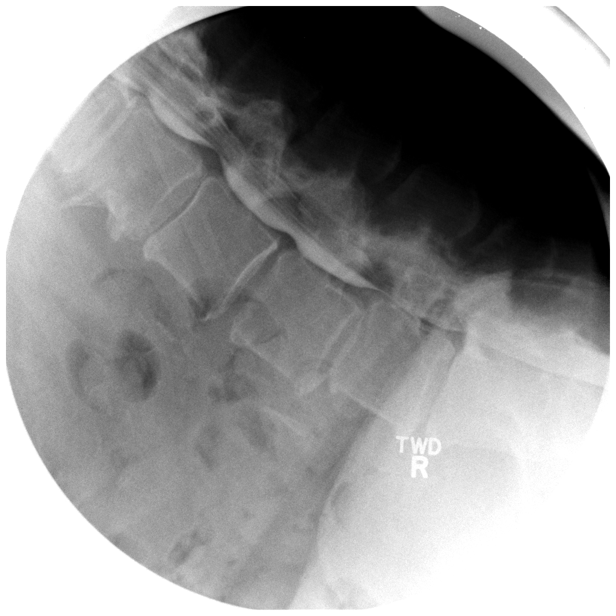

[Series 8: run · 1 of 1 slices shown (11 of 12)]
[im 1/1]
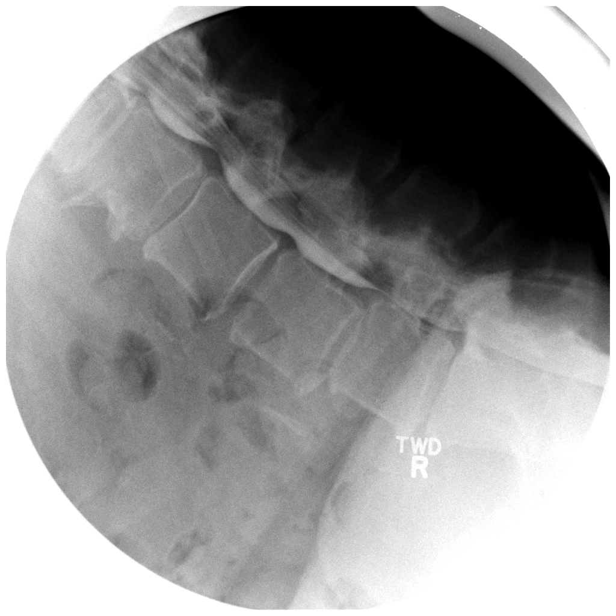

[Series 9: run · 1 of 1 slices shown (12 of 12)]
[im 1/1]
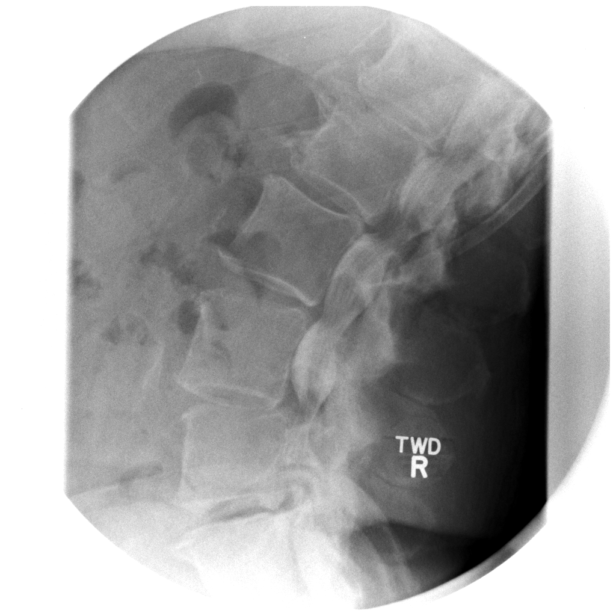

[12 of 18 positions shown; findings below may reference images not displayed]

FINDINGS: Good intrathecal contrast opacification.  Multilevel
moderate and large defects on the ventral thecal sac.  Relative
block of myelographic contrast at L4-L5 where there is grade 1
anterolisthesis .  Spinal stenosis appears moderate to severe at L2-
L3.

Standing views in neutral flexion and extension.  In flexion view
mass effect on the thecal sac Short at L2-L3.  The L4-L5 level
appears unchanged.  No significant worsening of the thecal sac
findings in extension.  No definite abnormal motion at L4-L5.
IMPRESSION: 1.  Multilevel lumbar spinal stenosis, maximal at L4-L5 and
moderate to severe at L2-L3.
2.  Grade 1 anterolisthesis at L4-L5 is new since 1007.  No
definite abnormal motion in flexion or extension.
3. See post myelogram CT findings below.

CT MYELOGRAPHY LUMBAR SPINE
FINDINGS: Calcified atherosclerosis of the aorta and common iliac
arteries.  Other visualized abdominal and pelvic viscera are within
normal limits.

Good intrathecal contrast opacification.  The conus medullaris
appears normal at L2.

Mild anterolisthesis of L4 on L5 measures 3 mm.  Visualized sacrum
and SI joints are within normal limits.

T12-L1:  Minimal disc bulge.

L1-L2:  Mild vacuum disc phenomena.  Mild to moderate
circumferential disc bulge, eccentric to the right.  Moderate facet
hypertrophy.  Mild right lateral recess stenosis.  No spinal
stenosis.  No significant foraminal stenosis.

L2-L3:  Moderate to severe vacuum disc phenomena.  Moderate to
severe circumferential disc bulge with significant central
protrusion of disc with cephalad migration.  Superimposed moderate
facet and ligament flavum hypertrophy.  Moderate to severe left
lateral recess stenosis.  Mild to moderate spinal stenosis.
Moderate left L2 foraminal stenosis.

L3-L4:  Mild vacuum disc phenomena.  Left eccentric circumferential
disc bulge.  Severe facet hypertrophy worse on the left where there
is vacuum phenomena.  Borderline to mild spinal stenosis.  Mild
left lateral recess stenosis.  No significant foraminal stenosis.

L4-L5:  Very severe facet hypertrophy, capacious vacuum facet
phenomenon on the right.  Moderate vacuum disc phenomena with
moderate circumferential disc bulge eccentric to the left.
Superimposed broad-based left foraminal disc protrusion.  Moderate
to severe spinal stenosis and left lateral recess stenosis.  Severe
left L4 foraminal stenosis.

L5-S1:  Severe vacuum disc phenomena.  Very severe facet
hypertrophy on the right with vacuum phenomena and subchondral
geodes.  Ligament flavum hypertrophy.  Moderate circumferential
disc osteophyte complex eccentric to the right.  Despite these, no
significant spinal or lateral recess stenosis.  There is severe
right L5 foraminal stenosis related to facet, disc and endplate
changes.  There is indistinct filling defect at the lateral aspect
of the right L5 foramen (series 4 image 85) which could be related
to disc or facet disease, uncertain.
IMPRESSION: 1.  Diffusely advanced lumbar disc degeneration. Diffuse severe to
very severe lumbar facet arthropathy, maximal on the right at L5-
S1.
2.  L4-L5 moderate to severe spinal stenosis, left lateral recess
stenosis, and severe left foraminal stenosis.
3.  L2-L3 mild to moderate spinal stenosis, moderate to severe left
lateral recess stenosis, and moderate left foraminal stenosis.
4.  L5-S1 severe multifactorial right foraminal stenosis.  Question
disc fragment or facet associated disease (synovial cyst?) In the
right lateral L5 foramen.
5.  L3-L4 borderline to mild spinal and left lateral recess
stenosis.

Case reviewed in person with Dr. Alis Abbate at the time of
dictation.

## 2013-03-22 ENCOUNTER — Other Ambulatory Visit: Payer: Self-pay | Admitting: Obstetrics and Gynecology

## 2013-03-23 ENCOUNTER — Telehealth: Payer: Self-pay | Admitting: Gastroenterology

## 2013-03-23 ENCOUNTER — Encounter: Payer: Self-pay | Admitting: Gastroenterology

## 2013-03-23 NOTE — Telephone Encounter (Signed)
Informed [name] pt should have had a Recall Colon letter mailed for this past April. We will send her a letter to schedule an appt. [name] states pt is not having any problems.

## 2013-05-14 ENCOUNTER — Ambulatory Visit (INDEPENDENT_AMBULATORY_CARE_PROVIDER_SITE_OTHER): Payer: Medicare Other | Admitting: Gynecology

## 2013-05-14 ENCOUNTER — Encounter: Payer: Self-pay | Admitting: Gynecology

## 2013-05-14 VITALS — BP 124/84

## 2013-05-14 DIAGNOSIS — N898 Other specified noninflammatory disorders of vagina: Secondary | ICD-10-CM

## 2013-05-14 DIAGNOSIS — L293 Anogenital pruritus, unspecified: Secondary | ICD-10-CM

## 2013-05-14 DIAGNOSIS — N952 Postmenopausal atrophic vaginitis: Secondary | ICD-10-CM

## 2013-05-14 LAB — WET PREP FOR TRICH, YEAST, CLUE
Trich, Wet Prep: NONE SEEN
Yeast Wet Prep HPF POC: NONE SEEN

## 2013-05-14 MED ORDER — FLUCONAZOLE 100 MG PO TABS: ORAL_TABLET | ORAL | Status: DC

## 2013-05-14 MED ORDER — NONFORMULARY OR COMPOUNDED ITEM: Status: DC

## 2013-05-14 NOTE — Patient Instructions (Addendum)
Hormone Therapy At menopause, your body begins making less estrogen and progesterone hormones. This causes the body to stop having menstrual periods. This is because estrogen and progesterone hormones control your periods and menstrual cycle. A lack of estrogen may cause symptoms such as:  Hot flushes (or hot flashes).  Vaginal dryness.  Dry skin.  Loss of sex drive.  Risk of bone loss (osteoporosis). When this happens, you may choose to take hormone therapy to get back the estrogen lost during menopause. When the hormone estrogen is given alone, it is usually referred to as ET (Estrogen Therapy). When the hormone progestin is combined with estrogen, it is generally called HT (Hormone Therapy). This was formerly known as hormone replacement therapy (HRT). Your caregiver can help you make a decision on what will be best for you. The decision to use HT seems to change often as new studies are done. Many studies do not agree on the benefits of hormone replacement therapy. LIKELY BENEFITS OF HT INCLUDE PROTECTION FROM:  Hot Flushes (also called hot flashes) - A hot flush is a sudden feeling of heat that spreads over the face and body. The skin may redden like a blush. It is connected with sweats and sleep disturbance. Women going through menopause may have hot flushes a few times a month or several times per day depending on the woman.  Osteoporosis (bone loss)- Estrogen helps guard against bone loss. After menopause, a woman's bones slowly lose calcium and become weak and brittle. As a result, bones are more likely to break. The hip, wrist, and spine are affected most often. Hormone therapy can help slow bone loss after menopause. Weight bearing exercise and taking calcium with vitamin D also can help prevent bone loss. There are also medications that your caregiver can prescribe that can help prevent osteoporosis.  Vaginal Dryness - Loss of estrogen causes changes in the vagina. Its lining may  become thin and dry. These changes can cause pain and bleeding during sexual intercourse. Dryness can also lead to infections. This can cause burning and itching. (Vaginal estrogen treatment can help relieve pain, itching, and dryness.)  Urinary Tract Infections are more common after menopause because of lack of estrogen. Some women also develop urinary incontinence because of low estrogen levels in the vagina and bladder.  Possible other benefits of estrogen include a positive effect on mood and short-term memory in women. RISKS AND COMPLICATIONS  Using estrogen alone without progesterone causes the lining of the uterus to grow. This increases the risk of lining of the uterus (endometrial) cancer. Your caregiver should give another hormone called progestin if you have a uterus.  Women who take combined (estrogen and progestin) HT appear to have an increased risk of breast cancer. The risk appears to be small, but increases throughout the time that HT is taken.  Combined therapy also makes the breast tissue slightly denser which makes it harder to read mammograms (breast X-rays).  Combined, estrogen and progesterone therapy can be taken together every day, in which case there may be spotting of blood. HT therapy can be taken cyclically in which case you will have menstrual periods. Cyclically means HT is taken for a set amount of days, then not taken, then this process is repeated.  HT may increase the risk of stroke, heart attack, breast cancer and forming blood clots in your leg.  Transdermal estrogen (estrogen that is absorbed through the skin with a patch or a cream) may have more positive results with:    Cholesterol.  Blood pressure.  Blood clots. Having the following conditions may indicate you should not have HT:  Endometrial cancer.  Liver disease.  Breast cancer.  Heart disease.  History of blood clots.  Stroke. TREATMENT   If you choose to take HT and have a uterus,  usually estrogen and progestin are prescribed.  Your caregiver will help you decide the best way to take the medications.  Possible ways to take estrogen include:  Pills.  Patches.  Gels.  Sprays.  Vaginal estrogen cream, rings and tablets.  It is best to take the lowest dose possible that will help your symptoms and take them for the shortest period of time that you can.  Hormone therapy can help relieve some of the problems (symptoms) that affect women at menopause. Before making a decision about HT, talk to your caregiver about what is best for you. Be well informed and comfortable with your decisions. HOME CARE INSTRUCTIONS   Follow your caregivers advice when taking the medications.  A Pap test is done to screen for cervical cancer.  The first Pap test should be done at age 36.  Between ages 56 and 38, Pap tests are repeated every 2 years.  Beginning at age 45, you are advised to have a Pap test every 3 years as long as your past 3 Pap tests have been normal.  Some women have medical problems that increase the chance of getting cervical cancer. Talk to your caregiver about these problems. It is especially important to talk to your caregiver if a new problem develops soon after your last Pap test. In these cases, your caregiver may recommend more frequent screening and Pap tests.  The above recommendations are the same for women who have or have not gotten the vaccine for HPV (Human Papillomavirus).  If you had a hysterectomy for a problem that was not a cancer or a condition that could lead to cancer, then you no longer need Pap tests. However, even if you no longer need a Pap test, a regular exam is a good idea to make sure no other problems are starting.   If you are between ages 40 and 59, and you have had normal Pap tests going back 10 years, you no longer need Pap tests. However, even if you no longer need a Pap test, a regular exam is a good idea to make sure no  other problems are starting.   If you have had past treatment for cervical cancer or a condition that could lead to cancer, you need Pap tests and screening for cancer for at least 20 years after your treatment.  If Pap tests have been discontinued, risk factors (such as a new sexual partner) need to be re-assessed to determine if screening should be resumed.  Some women may need screenings more often if they are at high risk for cervical cancer.  Get mammograms done as per the advice of your caregiver. SEEK IMMEDIATE MEDICAL CARE IF:  You develop abnormal vaginal bleeding.  You have pain or swelling in your legs, shortness of breath, or chest pain.  You develop dizziness or headaches.  You have lumps or changes in your breasts or armpits.  You have slurred speech.  You develop weakness or numbness of your arms or legs.  You have pain, burning, or bleeding when urinating.  You develop abdominal pain. Document Released: 06/19/2003 Document Revised: 12/13/2011 Document Reviewed: 10/07/2010 [facility] Patient Information 2014 [place], [place]. Candidal Vulvovaginitis Candidal vulvovaginitis is an  infection of the vagina and vulva. The vulva is the skin around the opening of the vagina. This may cause itching and discomfort in and around the vagina.  HOME CARE  Only take medicine as told by your doctor.  Do not have sex (intercourse) until the infection is healed or as told by your doctor.  Practice safe sex.  Tell your sex partner about your infection.  Do not douche or use tampons.  Wear cotton underwear. Do not wear tight pants or panty hose.  Eat yogurt. This may help treat and prevent yeast infections. GET HELP RIGHT AWAY IF:   You have a fever.  Your problems get worse during treatment or do not get better in 3 days.  You have discomfort, irritation, or itching in your vagina or vulva area.  You have pain after sex.  You start to get belly (abdominal)  pain. MAKE SURE YOU:  Understand these instructions.  Will watch your condition.  Will get help right away if you are not doing well or get worse. Document Released: 12/17/2008 Document Revised: 12/13/2011 Document Reviewed: 12/17/2008 [facility] Patient Information 2014 [place], [place].

## 2013-05-14 NOTE — Progress Notes (Signed)
The patient presented to the office today complaining of vulvar pruritus but no dysuria or frequency. The patient has not been seen in the office for a year and half. Patient is on no hormone replacement therapy. Patient is sexually active her husband died recently. She denied any fever, chills, nausea, or vomiting.  Exam: Bartholin urethra with atrophic changes Vagina: Atrophic changes noted Cervix: atrophic no lesions or discharge  Wet prep: Negative some bacteria was noted  Assessment/plan: Patient's symptoms may be attributed to vaginal atrophy perhaps external vulvovaginitis not detected on a wet prep which was only done inside the vagina. Patient will be given a prescription for Diflucan 150 mg to take one by mouth today. She will be started on estradiol vaginal cream to apply twice a week for her vaginal atrophy. I will see her back in October for her annual exam and reassess her vaginal epithelium at that point. The risks benefits and pros and cons of hormone replacement therapy were discussed.

## 2013-05-24 ENCOUNTER — Telehealth: Payer: Self-pay | Admitting: *Deleted

## 2013-05-24 MED ORDER — CIPROFLOXACIN HCL 250 MG PO TABS: 250.0000 mg | ORAL_TABLET | Freq: Two times a day (BID) | ORAL | Status: DC

## 2013-05-24 MED ORDER — PHENAZOPYRIDINE HCL 200 MG PO TABS: 200.0000 mg | ORAL_TABLET | Freq: Three times a day (TID) | ORAL | Status: DC | PRN

## 2013-05-24 MED ORDER — URIBEL 118 MG PO CAPS: ORAL_CAPSULE | ORAL | Status: DC

## 2013-05-24 NOTE — Telephone Encounter (Signed)
Rx sent to pharmacy   

## 2013-05-24 NOTE — Telephone Encounter (Signed)
Pt was seen on 05/14/13 given treatment for yeast infection and estradiol cream for vaginal dryness. Pt said for the last 3 days she has been having frequently, urgency but small amount of urine output. She believes it may be a UTI, she asked since being seen on 05/14/13 she could come and leave a u/a? Please advise

## 2013-05-24 NOTE — Telephone Encounter (Signed)
Pt informed with all the below, both rx were sent to pharmacy.

## 2013-05-24 NOTE — Telephone Encounter (Signed)
Pt was given Rx for uribel 118 mg tablet today required prior authorization and due to pt insurance this medication is not covered. It will be $26.71 and she can not afford this. Pt asked if other options? Or okay to just have Cipro? Please advise

## 2013-05-24 NOTE — Telephone Encounter (Signed)
Please call and prescription for Cipro to 50 mg one by mouth twice a day for 3 days #6. Also call in Uribell one by mouth 4 times a day for 2 days #8. However increase her fluid intake. If she does not have any improvement will be to see her next week or sooner or the emergency room if after hours if she develops severe back pain, fever, chills nausea or vomiting.

## 2013-05-24 NOTE — Telephone Encounter (Signed)
Pyridium 200mg  one po TID for 3 days # 9

## 2013-06-01 ENCOUNTER — Encounter: Payer: Self-pay | Admitting: Gynecology

## 2013-06-20 ENCOUNTER — Other Ambulatory Visit: Payer: Self-pay | Admitting: Dermatology

## 2013-06-25 ENCOUNTER — Encounter: Payer: Self-pay | Admitting: Gynecology

## 2013-06-25 ENCOUNTER — Ambulatory Visit (INDEPENDENT_AMBULATORY_CARE_PROVIDER_SITE_OTHER): Payer: Medicare Other | Admitting: Gynecology

## 2013-06-25 VITALS — BP 118/76 | Ht 67.5 in | Wt 194.8 lb

## 2013-06-25 DIAGNOSIS — N899 Noninflammatory disorder of vagina, unspecified: Secondary | ICD-10-CM

## 2013-06-25 DIAGNOSIS — Z79890 Hormone replacement therapy (postmenopausal): Secondary | ICD-10-CM

## 2013-06-25 DIAGNOSIS — N951 Menopausal and female climacteric states: Secondary | ICD-10-CM

## 2013-06-25 DIAGNOSIS — N952 Postmenopausal atrophic vaginitis: Secondary | ICD-10-CM

## 2013-06-25 DIAGNOSIS — Z780 Asymptomatic menopausal state: Secondary | ICD-10-CM

## 2013-06-25 DIAGNOSIS — N898 Other specified noninflammatory disorders of vagina: Secondary | ICD-10-CM

## 2013-06-25 MED ORDER — VALACYCLOVIR HCL 500 MG PO TABS: 500.0000 mg | ORAL_TABLET | Freq: Every day | ORAL | Status: DC

## 2013-06-25 NOTE — Progress Notes (Signed)
[name] [birth year] [id number]   History:    67 y.o. 4 GYN followup. Patient with severe vaginal atrophy had recently be seen last month whereby she was started on vaginal estradiol cream twice a week. She states she has noticed some improvement not completely better yet. Patient is currently being followed by Dr. Jacky Kindle who is treating her for her diabetes as well as hypertension. Dr. Dareen Piano is treating her for her psoriatic arthritis. Review of patient's records indicated that she does have family history of colon cancer and is due for a colonoscopy this year. Her last bone density study was in 2008 which was normal. 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 Her last mammogram was in January of this year. She states that both her shingles and Tdap vaccine are up to date and she is in the process of getting her flu vaccine. 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 Last Pap smear 2013 was normal.  Past medical history,surgical history, family history and social history were all reviewed and documented in the EPIC chart.  Gynecologic History Patient's last menstrual period was 10/04/2001. Contraception: post menopausal status Last Pap: 2013. Results were: normal Last mammogram: 2013. Results were: normal  Obstetric History OB History  Gravida Para Term Preterm AB SAB TAB Ectopic Multiple Living  0                  ROS: A ROS was performed and pertinent positives and negatives are included in the history.  GENERAL: No fevers or chills. HEENT: No change in vision, no earache, sore throat or sinus congestion. NECK: No pain or stiffness. CARDIOVASCULAR: No chest pain or pressure. No palpitations. PULMONARY: No shortness of breath, cough or wheeze. GASTROINTESTINAL: No abdominal pain, nausea, vomiting or diarrhea, melena or bright red blood per rectum. GENITOURINARY: No urinary frequency, urgency, hesitancy or dysuria.  MUSCULOSKELETAL: No joint or muscle pain, no back pain, no recent trauma. DERMATOLOGIC: No rash, no itching, no lesions. ENDOCRINE: No polyuria, polydipsia, no heat or cold intolerance. No recent change in weight. HEMATOLOGICAL: No anemia or easy bruising or bleeding. NEUROLOGIC: No headache, seizures, numbness, tingling or weakness. PSYCHIATRIC: No depression, no loss of interest in normal activity or change in sleep pattern.     Exam: chaperone present  BP 118/76  Ht 5' 7.5" (1.715 m)  Wt 194 lb 12.8 oz (88.361 kg)  BMI 30.04 kg/m2  LMP 10/04/2001  Body mass index is 30.04 kg/(m^2).  General appearance : Well developed well nourished female. No acute distress HEENT: Neck supple, trachea midline, no carotid bruits, no thyroidmegaly Lungs: Clear to auscultation, no rhonchi or wheezes, or rib retractions  Heart: Regular rate and rhythm, no murmurs or gallops Breast:Examined in sitting and supine position were symmetrical in appearance, no palpable masses or tenderness,  no skin retraction, no nipple inversion, no nipple discharge, no skin discoloration, no axillary or supraclavicular lymphadenopathy Abdomen: no palpable masses or tenderness, no rebound or guarding Extremities: no edema or skin discoloration or tenderness  Pelvic:  Bartholin, Urethra, Skene Glands: Within normal limits             Vagina: No gross lesions or discharge,atrophic changes  Cervix: No gross lesions or discharge  Uterus  anteverted, normal size, shape and consistency, non-tender and mobile  Adnexa  Without masses or tenderness  Anus and perineum  normal   Rectovaginal  normal sphincter tone without palpated masses or  tenderness             Hemoccult to schedule a colonoscopy this year     Assessment/Plan:  67 y.o. female vaginal atrophy improving with vaginal cream twice a week. Patient was reminded to schedule her overdue mammogram as well as her bone density study and colonoscopy. Pap smear not done today  in accordance with the new guidelines. PCP to draw her blood. Patient will schedule bone density study here in our office in the next few weeks. We discussed the importance of calcium and vitamin D in regular exercise for osteoporosis prevention.   Ok Edwards MD, 12:02 PM 06/25/2013

## 2013-06-25 NOTE — Patient Instructions (Addendum)
Mammogram Tips Healthy women should begin getting mammograms every year or two once they reach age 66, and once a year when they reach age 29. Here are tips:  Find an experienced, high-volume center with accomplished radiologists. You can ask for their credentials.  Ask to see the certificate showing the center is approved by the U.S. Food and Drug Administration.  Use the same center regularly, so it is easier to compare your new mammograms with your old ones.  Bring a list of places you have had mammograms, dates, biopsies or other breast treatments. Bring old mammograms with you or have them sent to your primary caregiver.  Describe any breast problems to your caregiver or the person doing the mammogram. Be ready to give past surgeries, birth control pills, hormone use, breast implants, growths, moles, breast scars and family or personal history of breast cancer.  Call your doctor or center to check on the mammogram if you hear nothing within 10 days. Do not assume everything was normal.  To protect your privacy, the mammogram results cannot be given over the phone or to anyone but you.  Radiation from a mammogram is very low and does not pose a radiation risk.  Mammograms can detect breast problems other than breast cancer.  You may be asked stand or sit in front of the X-ray machine.  Two small plastic or glass plates are placed around the breast when taking the X-ray.  If you are menstruating, schedule your mammogram a week after your menstrual period.  Do not wear deodorants, powder or perfume when getting a mammogram.  Wash your breasts and under your arms before getting a mammogram.  Wear cloths that are easy for you to undress and dress.  Arrive at the center at least 15 minutes before the mammogram is scheduled.  There may be slight discomfort during the mammogram, but it goes away shortly after the test.  Try to relax as much as possible during the mammogram.  Talk  to your caregiver if you do not understand the results of the mammogram.  Follow the recommendations of your caregiver regarding further tests and treatments if needed.  Get a second opinion if you are concerned or question the results of the mammogram, further tests or treatment if needed.  Continue with monthly self-breast exams and yearly caregiver exams even if the mammogram is normal.  Your caregiver may recommend getting a mammogram before age 65 and more often if you are at high risk for developing breast cancer. Document Released: 01/06/2006 Document Revised: 12/13/2011 Document Reviewed: 09/13/2008 [facility] Patient Information 2014 [place], [place]. Colonoscopy A colonoscopy is an exam to evaluate your entire colon. In this exam, your colon is cleansed. A long fiberoptic tube is inserted through your rectum and into your colon. The fiberoptic scope (endoscope) is a long bundle of enclosed and very flexible fibers. These fibers transmit light to the area examined and send images from that area to your caregiver. Discomfort is usually minimal. You may be given a drug to help you sleep (sedative) during or prior to the procedure. This exam helps to detect lumps (tumors), polyps, inflammation, and areas of bleeding. Your caregiver may also take a small piece of tissue (biopsy) that will be examined under a microscope. LET YOUR CAREGIVER KNOW ABOUT:   Allergies to food or medicine.  Medicines taken, including vitamins, herbs, eyedrops, over-the-counter medicines, and creams.  Use of steroids (by mouth or creams).  Previous problems with anesthetics or numbing medicines.  History of bleeding problems or blood clots.  Previous surgery.  Other health problems, including diabetes and kidney problems.  Possibility of pregnancy, if this applies. BEFORE THE PROCEDURE   A clear liquid diet may be required for 2 days before the exam.  Ask your caregiver about changing or stopping your  regular medications.  Liquid injections (enemas) or laxatives may be required.  A large amount of electrolyte solution may be given to you to drink over a short period of time. This solution is used to clean out your colon.  You should be present 60 minutes prior to your procedure or as directed by your caregiver. AFTER THE PROCEDURE   If you received a sedative or pain relieving medication, you will need to arrange for someone to drive you home.  Occasionally, there is a little blood passed with the first bowel movement. Do not be concerned. FINDING OUT THE RESULTS OF YOUR TEST Not all test results are available during your visit. If your test results are not back during the visit, make an appointment with your caregiver to find out the results. Do not assume everything is normal if you have not heard from your caregiver or the medical facility. It is important for you to follow up on all of your test results. HOME CARE INSTRUCTIONS   It is not unusual to pass moderate amounts of gas and experience mild abdominal cramping following the procedure. This is due to air being used to inflate your colon during the exam. Walking or a warm pack on your belly (abdomen) may help.  You may resume all normal meals and activities after sedatives and medicines have worn off.  Only take over-the-counter or prescription medicines for pain, discomfort, or fever as directed by your caregiver. Do not use aspirin or blood thinners if a biopsy was taken. Consult your caregiver for medicine usage if biopsies were taken. SEEK IMMEDIATE MEDICAL CARE IF:   You have a fever.  You pass large blood clots or fill a toilet with blood following the procedure. This may also occur 10 to 14 days following the procedure. This is more likely if a biopsy was taken.  You develop abdominal pain that keeps getting worse and cannot be relieved with medicine. Document Released: 09/17/2000 Document Revised: 12/13/2011 Document  Reviewed: 05/02/2008 [facility] Patient Information 2014 [place], [place]. Bone Densitometry Bone densitometry is a special X-ray that measures your bone density and can be used to help predict your risk of bone fractures. This test is used to determine bone mineral content and density to diagnose osteoporosis. Osteoporosis is the loss of bone that may cause the bone to become weak. Osteoporosis commonly occurs in women entering menopause. However, it may be found in men and in people with other diseases. PREPARATION FOR TEST No preparation necessary. WHO SHOULD BE TESTED?  All women older than 80.  Postmenopausal women (50 to 88) with risk factors for osteoporosis.  People with a previous fracture caused by normal activities.  People with a small body frame (less than 127 poundsor a body mass index [BMI] of less than 21).  People who have a parent with a hip fracture or history of osteoporosis.  People who smoke.  People who have rheumatoid arthritis.  Anyone who engages in excessive alcohol use (more than 3 drinks most days).  Women who experience early menopause. WHEN SHOULD YOU BE RETESTED? Current guidelines suggest that you should wait at least 2 years before doing a bone density test again if  your first test was normal.Recent studies indicated that women with normal bone density may be able to wait a few years before needing to repeat a bone density test. You should discuss this with your caregiver.  NORMAL FINDINGS   Normal: less than standard deviation below normal (greater than -1).  Osteopenia: 1 to 2.5 standard deviations below normal (-1 to -2.5).  Osteoporosis: greater than 2.5 standard deviations below normal (less than -2.5). Test results are reported as a "T score" and a "Z score."The T score is a number that compares your bone density with the bone density of healthy, young women.The Z score is a number that compares your bone density with the scores of women who  are the same age, gender, and race.  Ranges for normal findings may vary among different laboratories and hospitals. You should always check with your doctor after having lab work or other tests done to discuss the meaning of your test results and whether your values are considered within normal limits. MEANING OF TEST  Your caregiver will go over the test results with you and discuss the importance and meaning of your results, as well as treatment options and the need for additional tests if necessary. OBTAINING THE TEST RESULTS It is your responsibility to obtain your test results. Ask the lab or department performing the test when and how you will get your results. Document Released: 10/12/2004 Document Revised: 12/13/2011 Document Reviewed: 11/04/2010 [facility] Patient Information 2014 [place], [place].

## 2013-06-26 ENCOUNTER — Encounter: Payer: Self-pay | Admitting: Obstetrics and Gynecology

## 2013-06-26 ENCOUNTER — Other Ambulatory Visit: Payer: Self-pay

## 2013-06-26 DIAGNOSIS — Z1231 Encounter for screening mammogram for malignant neoplasm of breast: Secondary | ICD-10-CM

## 2013-06-29 ENCOUNTER — Ambulatory Visit (AMBULATORY_SURGERY_CENTER): Payer: Self-pay | Admitting: *Deleted

## 2013-06-29 VITALS — Ht 67.5 in | Wt 198.8 lb

## 2013-06-29 DIAGNOSIS — Z800 Family history of malignant neoplasm of digestive organs: Secondary | ICD-10-CM

## 2013-06-29 DIAGNOSIS — Z1211 Encounter for screening for malignant neoplasm of colon: Secondary | ICD-10-CM

## 2013-06-29 NOTE — Progress Notes (Signed)
Denies allergies to eggs or soy products. Denies complications with sedation or anesthesia. 

## 2013-07-02 ENCOUNTER — Telehealth: Payer: Self-pay | Admitting: Gastroenterology

## 2013-07-02 DIAGNOSIS — Z1211 Encounter for screening for malignant neoplasm of colon: Secondary | ICD-10-CM

## 2013-07-02 MED ORDER — MOVIPREP 100 G PO SOLR: ORAL | Status: DC

## 2013-07-02 NOTE — Telephone Encounter (Signed)
Pt notified that prep was resent to [facility] in [place], [place]

## 2013-07-05 ENCOUNTER — Ambulatory Visit (AMBULATORY_SURGERY_CENTER): Payer: Medicare Other | Admitting: Gastroenterology

## 2013-07-05 ENCOUNTER — Encounter: Payer: Self-pay | Admitting: Gastroenterology

## 2013-07-05 VITALS — BP 140/59 | HR 60 | Temp 96.1°F | Resp 21 | Ht 67.5 in | Wt 198.0 lb

## 2013-07-05 DIAGNOSIS — Z800 Family history of malignant neoplasm of digestive organs: Secondary | ICD-10-CM

## 2013-07-05 DIAGNOSIS — Z1211 Encounter for screening for malignant neoplasm of colon: Secondary | ICD-10-CM

## 2013-07-05 LAB — GLUCOSE, CAPILLARY
Glucose-Capillary: 112 mg/dL — ABNORMAL HIGH (ref 70–99)
Glucose-Capillary: 99 mg/dL (ref 70–99)

## 2013-07-05 MED ORDER — SODIUM CHLORIDE 0.9 % IV SOLN: 500.0000 mL | INTRAVENOUS | Status: DC

## 2013-07-05 NOTE — Op Note (Signed)
[facility] [address] [place], [address]   COLONOSCOPY PROCEDURE REPORT  PATIENT: Claire, Lyons  MR#: [id number] BIRTHDATE: 1946/07/28 , 67  yrs. old GENDER: Female ENDOSCOPIST: Mardella Layman, MD, [facility] REFERRED BY: PROCEDURE DATE:  07/05/2013 PROCEDURE:   Colonoscopy, screening First Screening Colonoscopy - Avg.  risk and is 50 yrs.  old or older - No.      History of Adenoma - Now for follow-up colonoscopy & has been > or = to 3 yrs.  N/A ASA CLASS:   Class III INDICATIONS:Patient's immediate family history of colon cancer. MEDICATIONS: propofol (Diprivan) 200mg  IV  DESCRIPTION OF PROCEDURE:   After the risks benefits and alternatives of the procedure were thoroughly explained, informed consent was obtained.  A digital rectal exam revealed no abnormalities of the rectum.   The LB [id number]  endoscope was introduced through the anus and advanced to the cecum, which was identified by both the appendix and ileocecal valve. No adverse events experienced.   The quality of the prep was good, using MoviPrep  The instrument was then slowly withdrawn as the colon was fully examined.      COLON FINDINGS: A normal appearing cecum, ileocecal valve, and appendiceal orifice were identified.  The ascending, hepatic flexure, transverse, splenic flexure, descending, sigmoid colon and rectum appeared unremarkable.  No polyps or cancers were seen. Very long and redundant colon noted. Retroflexed views revealed no abnormalities. The time to cecum=7 minutes 52 seconds.  Withdrawal time=5 minutes 18 seconds.  The scope was withdrawn and the procedure completed. COMPLICATIONS: There were no complications.  ENDOSCOPIC IMPRESSION: Normal colon  RECOMMENDATIONS: 1.  Continue current medications 2.  Given your significant family history of colon cancer, you should have a repeat colonoscopy in 5 years   eSigned:  Mardella Layman, MD, [facility] 07/05/2013  10:13 AM   cc: Geoffry Paradise, MD

## 2013-07-05 NOTE — Progress Notes (Signed)
Patient did not experience any of the following events: a burn prior to discharge; a fall within the facility; wrong site/side/patient/procedure/implant event; or a hospital transfer or hospital admission upon discharge from the facility. (G8907) Patient did not have preoperative order for IV antibiotic SSI prophylaxis. (G8918)  

## 2013-07-05 NOTE — Patient Instructions (Addendum)

## 2013-07-06 ENCOUNTER — Telehealth: Payer: Self-pay

## 2013-07-06 NOTE — Telephone Encounter (Signed)
  Follow up Call-  Call back number 07/05/2013  Post procedure Call Back phone  # [phone] hm  Permission to leave phone message Yes     Patient questions:  Do you have a fever, pain , or abdominal swelling? no Pain Score  0 *  Have you tolerated food without any problems? yes  Have you been able to return to your normal activities? yes  Do you have any questions about your discharge instructions: Diet   no Medications  no Follow up visit  no  Do you have questions or concerns about your Care? no  Actions: * If pain score is 4 or above: No action needed, pain <4.

## 2013-07-12 ENCOUNTER — Ambulatory Visit
Admission: RE | Admit: 2013-07-12 | Discharge: 2013-07-12 | Disposition: A | Discharge status: Home / Self Care | Payer: 59 | Source: Ambulatory Visit

## 2013-07-12 DIAGNOSIS — Z1231 Encounter for screening mammogram for malignant neoplasm of breast: Secondary | ICD-10-CM

## 2013-07-17 ENCOUNTER — Other Ambulatory Visit: Payer: Self-pay | Admitting: Gynecology

## 2013-07-17 DIAGNOSIS — R928 Other abnormal and inconclusive findings on diagnostic imaging of breast: Secondary | ICD-10-CM

## 2013-08-09 ENCOUNTER — Other Ambulatory Visit: Payer: Self-pay

## 2013-08-10 ENCOUNTER — Ambulatory Visit
Admission: RE | Admit: 2013-08-10 | Discharge: 2013-08-10 | Disposition: A | Discharge status: Home / Self Care | Payer: 59 | Source: Ambulatory Visit | Attending: Gynecology | Admitting: Gynecology

## 2013-08-10 DIAGNOSIS — R928 Other abnormal and inconclusive findings on diagnostic imaging of breast: Secondary | ICD-10-CM

## 2013-08-13 ENCOUNTER — Other Ambulatory Visit: Payer: Self-pay | Admitting: Gynecology

## 2013-08-13 DIAGNOSIS — R921 Mammographic calcification found on diagnostic imaging of breast: Secondary | ICD-10-CM

## 2013-08-16 ENCOUNTER — Ambulatory Visit (INDEPENDENT_AMBULATORY_CARE_PROVIDER_SITE_OTHER): Payer: Medicare Other

## 2013-08-16 ENCOUNTER — Other Ambulatory Visit: Payer: Self-pay | Admitting: Gynecology

## 2013-08-16 DIAGNOSIS — Z780 Asymptomatic menopausal state: Secondary | ICD-10-CM

## 2013-08-16 DIAGNOSIS — N951 Menopausal and female climacteric states: Secondary | ICD-10-CM

## 2013-08-17 ENCOUNTER — Other Ambulatory Visit: Payer: Self-pay | Admitting: Diagnostic Radiology

## 2013-08-17 ENCOUNTER — Ambulatory Visit
Admission: RE | Admit: 2013-08-17 | Discharge: 2013-08-17 | Disposition: A | Discharge status: Home / Self Care | Payer: 59 | Source: Ambulatory Visit | Attending: Gynecology | Admitting: Gynecology

## 2013-08-17 DIAGNOSIS — R921 Mammographic calcification found on diagnostic imaging of breast: Secondary | ICD-10-CM

## 2013-08-21 ENCOUNTER — Other Ambulatory Visit: Payer: Self-pay | Admitting: *Deleted

## 2013-08-21 DIAGNOSIS — M898X9 Other specified disorders of bone, unspecified site: Secondary | ICD-10-CM

## 2013-08-24 ENCOUNTER — Other Ambulatory Visit: Payer: Medicare Other

## 2013-08-24 DIAGNOSIS — M898X9 Other specified disorders of bone, unspecified site: Secondary | ICD-10-CM

## 2013-08-25 LAB — VITAMIN D 25 HYDROXY (VIT D DEFICIENCY, FRACTURES): Vit D, 25-Hydroxy: 38 ng/mL (ref 30–89)

## 2013-08-27 ENCOUNTER — Other Ambulatory Visit: Payer: Self-pay | Admitting: Gynecology

## 2013-08-27 ENCOUNTER — Encounter: Payer: Self-pay | Admitting: Gynecology

## 2013-08-27 DIAGNOSIS — E349 Endocrine disorder, unspecified: Secondary | ICD-10-CM

## 2013-08-27 LAB — PTH, INTACT AND CALCIUM
Calcium: 9.7 mg/dL (ref 8.4–10.5)
PTH: 73.8 pg/mL — ABNORMAL HIGH (ref 14.0–72.0)

## 2013-08-28 ENCOUNTER — Other Ambulatory Visit: Payer: Self-pay | Admitting: Gynecology

## 2013-10-10 ENCOUNTER — Other Ambulatory Visit: Payer: Medicare Other

## 2013-10-19 ENCOUNTER — Other Ambulatory Visit: Payer: Medicare Other

## 2013-10-19 DIAGNOSIS — E349 Endocrine disorder, unspecified: Secondary | ICD-10-CM

## 2013-10-22 LAB — PARATHYROID HORMONE, INTACT (NO CA): PTH: 75.7 pg/mL — ABNORMAL HIGH (ref 14.0–72.0)

## 2013-10-24 ENCOUNTER — Other Ambulatory Visit: Payer: Self-pay | Admitting: Gynecology

## 2013-10-24 DIAGNOSIS — E349 Endocrine disorder, unspecified: Secondary | ICD-10-CM

## 2014-02-04 ENCOUNTER — Other Ambulatory Visit: Payer: Self-pay | Admitting: Gynecology

## 2014-02-04 ENCOUNTER — Other Ambulatory Visit: Payer: Self-pay

## 2014-02-04 DIAGNOSIS — R921 Mammographic calcification found on diagnostic imaging of breast: Secondary | ICD-10-CM

## 2014-02-21 ENCOUNTER — Encounter: Payer: Self-pay | Admitting: Internal Medicine

## 2014-02-21 ENCOUNTER — Ambulatory Visit (INDEPENDENT_AMBULATORY_CARE_PROVIDER_SITE_OTHER): Payer: Medicare Other | Admitting: Internal Medicine

## 2014-02-21 VITALS — BP 130/78 | HR 68 | Ht 67.5 in | Wt 201.1 lb

## 2014-02-21 DIAGNOSIS — Z800 Family history of malignant neoplasm of digestive organs: Secondary | ICD-10-CM

## 2014-02-21 DIAGNOSIS — K219 Gastro-esophageal reflux disease without esophagitis: Secondary | ICD-10-CM

## 2014-02-21 DIAGNOSIS — E119 Type 2 diabetes mellitus without complications: Secondary | ICD-10-CM

## 2014-02-21 DIAGNOSIS — R1310 Dysphagia, unspecified: Secondary | ICD-10-CM

## 2014-02-21 DIAGNOSIS — K222 Esophageal obstruction: Secondary | ICD-10-CM

## 2014-02-21 NOTE — Progress Notes (Signed)
HISTORY OF PRESENT ILLNESS:  [name] is a 68 y.o. female with multiple medical problems as listed below, including psoriatic arthritis for which she is on immunosuppressive therapy, fibromyalgia, type 2 diabetes mellitus, hypertension, chronic back pain, and obesity. She is status post cholecystectomy and appendectomy. Her GI care previously provided by Dr. Verl Blalock. GI problems include GERD, symptomatic esophageal stricture requiring dilation, esophageal candidiasis, and screening colonoscopy. She also has a history of hemachromatosis managed and followed elsewhere. She is new to me. Accompanied by her friend. Chief complaint today is intermittent dysphagia to solids and pills. Worse over the past year. Her last esophageal dilation was performed in December of 2011 ([place]) which helped. She takes omeprazole 40 mg daily. Despite this she has frequent breakthrough pyrosis at night. Other GI complaints include gas with bloating and belching. She has a family history of colon cancer. Last colonoscopy October 2014 was normal. Followup in 5 years recommended  REVIEW OF SYSTEMS:  All non-GI ROS negative except for sinus and allergy, arthritis, back pain, cough, depression, fatigue, hearing problems, muscle pains, night sweats, sleeping problems, ankle swelling, increased thirst  Past Medical History  Diagnosis Date  . Dysplasia of cervix, low grade (CIN 1)   . Pelvic adhesions   . Herpes   . Peripheral neuropathy   . Hypertension   . Diabetes mellitus     type II  . Esophageal yeast infection 2011  . Hemochromatosis   . IBS (irritable bowel syndrome)   . Fatty liver   . Unspecified gastritis and gastroduodenitis without mention of hemorrhage   . Family history of malignant neoplasm of gastrointestinal tract   . GERD (gastroesophageal reflux disease)   . Neuropathy   . Carpal tunnel syndrome   . Gastritis   . Fundic gland polyps of stomach, benign   . Fibromyalgia   .  Psoriatic arthritis   . Hypothyroidism   . Candida esophagitis     Past Surgical History  Procedure Laterality Date  . Foot surgery    . Appendectomy      dx lap   . Lap chloecystectomy    . Cervical fusion      c5-c7  . Dl left salpingectomy lysis of adhesions, right btl    . Tubal ligation      RIGHT  . Pelvic laparoscopy    . Back surgery      L1-L5    Social History [name]  reports that she has never smoked. She has never used smokeless tobacco. She reports that she does not drink alcohol or use illicit drugs.  family history includes Cancer in her mother; Colon cancer in her mother and paternal grandfather; Diabetes in her sister; Heart disease in her maternal grandmother, mother, and sister; Hypertension in her maternal grandmother, mother, and sister; Lung cancer in her father; Stroke in her brother. There is no history of Esophageal cancer, Rectal cancer, or Stomach cancer.  No Known Allergies     PHYSICAL EXAMINATION: Vital signs: BP 130/78  Pulse 68  Ht 5' 7.5" (1.715 m)  Wt 201 lb 2 oz (91.23 kg)  BMI 31.02 kg/m2  LMP 10/04/2001 General: Well-developed, well-nourished, no acute distress HEENT: Sclerae are anicteric, conjunctiva pink. Oral mucosa intact. No thrush Lungs: Clear Heart: Regular Abdomen: soft, obese, nontender, nondistended, no obvious ascites, no peritoneal signs, normal bowel sounds. No organomegaly. Extremities: No edema Psychiatric: alert and oriented x3. Cooperative   ASSESSMENT:  #1. GERD. Breakthrough symptoms on once  daily PPI #2. Recurrent intermittent solid food and pill dysphagia. Suspect recurrent peptic stricture #3. Family history of colon cancer. Screening colonoscopy up-to-date #4. Multiple medical problems including diabetes   PLAN:  #1. Reflux precautions #2. Continue PPI #3. Antacids for breakthrough at night. May need to increase dose of PPI #4. Upper endoscopy with esophageal dilation. The patient is  high-risk given her comorbidities.The nature of the procedure, as well as the risks, benefits, and alternatives were carefully and thoroughly reviewed with the patient. Ample time for discussion and questions allowed. The patient understood, was satisfied, and agreed to proceed. #5. Hold diabetic medications the day of the procedure until resuming normal oral intake post procedure #6. Routine repeat screening colonoscopy due around October 2019

## 2014-02-21 NOTE — Patient Instructions (Signed)

## 2014-03-05 ENCOUNTER — Ambulatory Visit (AMBULATORY_SURGERY_CENTER): Payer: Medicare Other | Admitting: Internal Medicine

## 2014-03-05 ENCOUNTER — Encounter: Payer: Self-pay | Admitting: Internal Medicine

## 2014-03-05 VITALS — BP 154/67 | HR 63 | Temp 98.2°F | Resp 16 | Ht 67.5 in | Wt 201.0 lb

## 2014-03-05 DIAGNOSIS — K222 Esophageal obstruction: Secondary | ICD-10-CM

## 2014-03-05 DIAGNOSIS — K219 Gastro-esophageal reflux disease without esophagitis: Secondary | ICD-10-CM

## 2014-03-05 DIAGNOSIS — R1310 Dysphagia, unspecified: Secondary | ICD-10-CM

## 2014-03-05 LAB — GLUCOSE, CAPILLARY
GLUCOSE-CAPILLARY: 107 mg/dL — AB (ref 70–99)
Glucose-Capillary: 125 mg/dL — ABNORMAL HIGH (ref 70–99)

## 2014-03-05 MED ORDER — SODIUM CHLORIDE 0.9 % IV SOLN: 500.0000 mL | INTRAVENOUS | Status: DC

## 2014-03-05 NOTE — Progress Notes (Signed)
Pt drowsy but adequate resp on RA and oriented.  Report to PACU RN and VSS before leaving PACU

## 2014-03-05 NOTE — Op Note (Signed)
[facility]  [address] [place], [address]   ENDOSCOPY PROCEDURE REPORT  PATIENT: [name], [name]  MR#: [id number] BIRTHDATE: Jan 06, 1946 , 72  yrs. old GENDER: Female ENDOSCOPIST: Eustace Quail, MD REFERRED BY:  Burnard Bunting, M.D. PROCEDURE DATE:  03/05/2014 PROCEDURE:  EGD, diagnostic and Maloney dilation of esophagus  - 44 F ASA CLASS:     Class II INDICATIONS:  Dysphagia. MEDICATIONS: MAC sedation, administered by CRNA and propofol (Diprivan) 100mg  IV TOPICAL ANESTHETIC: none  DESCRIPTION OF PROCEDURE: After the risks benefits and alternatives of the procedure were thoroughly explained, informed consent was obtained.  The LB [id number] endoscope was introduced through the mouth and advanced to the second portion of the duodenum. Without limitations.  The instrument was slowly withdrawn as the mucosa was fully examined.    EXAM: The esophagus revealed large caliber distal ring, but was otherwise normal.  Stomach with small fundic gland polyps, but otherwise normal.  normal duodenum.  Retroflexed views revealed no abnormalities.     The scope was then withdrawn from the patient and the procedure completed. THERAPY; 42F Maloney dilator passed w/o resistance or heme. tolerated well.  COMPLICATIONS: There were no complications. ENDOSCOPIC IMPRESSION: 1. The esophagus revealed large caliber distal ring s/p dilation - 54 2. GERD  RECOMMENDATIONS: 1.  Clear liquids until 11 am, then soft foods rest of day.  Resume prior diet tomorrow. 2.  Continue current medications  REPEAT EXAM:  eSigned:  Eustace Quail, MD 03/05/2014 8:51 AM   CC:The Patient and Burnard Bunting, MD

## 2014-03-05 NOTE — Patient Instructions (Addendum)

## 2014-03-05 NOTE — Progress Notes (Signed)
Called to room to assist during endoscopic procedure.  Patient ID and intended procedure confirmed with present staff. Received instructions for my participation in the procedure from the performing physician.  

## 2014-03-06 ENCOUNTER — Telehealth: Payer: Self-pay | Admitting: *Deleted

## 2014-03-06 NOTE — Telephone Encounter (Signed)
  Follow up Call-  Call back number 03/05/2014 07/05/2013  Post procedure Call Back phone  # [phone] hm  Permission to leave phone message Yes Yes     Patient questions:  Do you have a fever, pain , or abdominal swelling? no Pain Score  0 *  Have you tolerated food without any problems? yes  Have you been able to return to your normal activities? yes  Do you have any questions about your discharge instructions: Diet   no Medications  no Follow up visit  no  Do you have questions or concerns about your Care? no  Actions: * If pain score is 4 or above: No action needed, pain <4.

## 2014-06-11 ENCOUNTER — Encounter: Payer: Self-pay | Admitting: Gynecology

## 2014-06-11 ENCOUNTER — Other Ambulatory Visit: Payer: Self-pay | Admitting: Gynecology

## 2014-06-11 MED ORDER — VALACYCLOVIR HCL 500 MG PO TABS: 500.0000 mg | ORAL_TABLET | Freq: Every day | ORAL | Status: DC

## 2014-06-18 ENCOUNTER — Other Ambulatory Visit: Payer: Medicare Other

## 2014-06-18 DIAGNOSIS — E349 Endocrine disorder, unspecified: Secondary | ICD-10-CM

## 2014-06-20 LAB — PTH, INTACT AND CALCIUM
CALCIUM: 9.3 mg/dL (ref 8.4–10.5)
PTH: 60 pg/mL (ref 14–64)

## 2014-06-23 LAB — VITAMIN D 1,25 DIHYDROXY
VITAMIN D 1, 25 (OH) TOTAL: 32 pg/mL (ref 18–72)
Vitamin D2 1, 25 (OH)2: <8 pg/mL
Vitamin D3 1, 25 (OH)2: 32 pg/mL

## 2014-07-11 ENCOUNTER — Encounter: Payer: Medicare Other | Admitting: Gynecology

## 2014-07-22 ENCOUNTER — Ambulatory Visit (INDEPENDENT_AMBULATORY_CARE_PROVIDER_SITE_OTHER): Payer: Medicare Other | Admitting: Gynecology

## 2014-07-22 ENCOUNTER — Encounter: Payer: Self-pay | Admitting: Gynecology

## 2014-07-22 VITALS — BP 138/70 | Ht 67.0 in | Wt 202.0 lb

## 2014-07-22 DIAGNOSIS — Z8619 Personal history of other infectious and parasitic diseases: Secondary | ICD-10-CM

## 2014-07-22 DIAGNOSIS — Z800 Family history of malignant neoplasm of digestive organs: Secondary | ICD-10-CM

## 2014-07-22 DIAGNOSIS — N6019 Diffuse cystic mastopathy of unspecified breast: Secondary | ICD-10-CM

## 2014-07-22 DIAGNOSIS — N952 Postmenopausal atrophic vaginitis: Secondary | ICD-10-CM

## 2014-07-22 MED ORDER — ESTRADIOL 0.1 MG/GM VA CREA: 2.0000 g | TOPICAL_CREAM | VAGINAL | Status: DC

## 2014-07-22 MED ORDER — VALACYCLOVIR HCL 500 MG PO TABS: ORAL_TABLET | ORAL | Status: DC

## 2014-07-22 NOTE — Patient Instructions (Signed)

## 2014-07-22 NOTE — Progress Notes (Signed)
[name] 1946/08/30 [id number]   History:    68 y.o.  for GYN followup. Patient suffers from vaginal atrophy. She was prescribe vaginal estradiol last year but discontinued them due to the extensive. She is not sexually active. Patient is currently being followed by Dr. Reynaldo Minium who is treating her for her diabetes as well as hypertension. Dr. Ouida Sills is treating her for her psoriatic arthritis. Review of patient's records indicated that she does have family history of colon cancer and had a colonoscopy last year which patient describes normal. Also many years ago patient had CIN-1  And  over 20  years or so her Pap smears. 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 Last year she had a left breast biopsy pathology report slight necrosis and calcification a fibrocystic changes no evidence of malignancy. Her shingles vaccine, fatigue vaccine and flu vaccine today.    Past medical history,surgical history, family history and social history were all reviewed and documented in the EPIC chart.  Gynecologic History Patient's last menstrual period was 10/04/2001. Contraception: post menopausal status Last Pap: 2030. Results were: normal Last mammogram: 2014. Results were: See above  Obstetric History OB History  Gravida Para Term Preterm AB SAB TAB Ectopic Multiple Living  0                  ROS: A ROS was performed and pertinent positives and negatives are included in the history.  GENERAL: No fevers or chills. HEENT: No change in vision, no earache, sore throat or sinus congestion. NECK: No pain or stiffness. CARDIOVASCULAR: No chest pain or pressure. No palpitations. PULMONARY: No shortness of breath, cough or wheeze. GASTROINTESTINAL: No abdominal pain, nausea, vomiting or diarrhea, melena or bright red blood per rectum.  GENITOURINARY: No urinary frequency, urgency, hesitancy or dysuria. MUSCULOSKELETAL: No joint or muscle pain, no back pain, no recent trauma. DERMATOLOGIC: No rash, no itching, no lesions. ENDOCRINE: No polyuria, polydipsia, no heat or cold intolerance. No recent change in weight. HEMATOLOGICAL: No anemia or easy bruising or bleeding. NEUROLOGIC: No headache, seizures, numbness, tingling or weakness. PSYCHIATRIC: No depression, no loss of interest in normal activity or change in sleep pattern.     Exam: chaperone present  BP 138/70  Ht 5\' 7"  (1.702 m)  Wt 202 lb (91.627 kg)  BMI 31.63 kg/m2  LMP 10/04/2001  Body mass index is 31.63 kg/(m^2).  General appearance : Well developed well nourished female. No acute distress HEENT: Neck supple, trachea midline, no carotid bruits, no thyroidmegaly Lungs: Clear to auscultation, no rhonchi or wheezes, or rib retractions  Heart: Regular rate and rhythm, no murmurs or gallops Breast:Examined in sitting and supine position were symmetrical in appearance, no palpable masses or tenderness,  no skin retraction, no nipple inversion, no nipple discharge, no skin discoloration, no axillary or supraclavicular lymphadenopathy Abdomen: no palpable masses or tenderness, no rebound or guarding Extremities: no edema or skin discoloration or tenderness  Pelvic:  Bartholin, Urethra, Skene Glands: Within normal limits             Vagina: No gross lesions or discharge, atrophic changes  Cervix: No gross lesions or discharge  Uterus  anteverted, normal size, shape and consistency, non-tender and mobile  Adnexa  Without masses or tenderness  Anus and perineum  normal  Rectovaginal  normal sphincter tone without palpated masses or tenderness             Hemoccult PCP provides     Assessment/Plan:  68 y.o. female will be prescribed Estrace vaginal cream to apply twice a week in hopes that her insurance will cover since it did not cover the compounded estradiol.  The risks and benefits and pros and cons were discussed. Pap smear is no longer needed according to last period patient to schedule her mammogram in the year. We discussed importance of calcium vitamin D and regular exercise for osteoporosis prevention prescription refill for Valtrex 500 mg she takes one daily for suppression of HSV.   Terrance Mass MD, 3:10 PM 07/22/2014

## 2014-07-24 ENCOUNTER — Telehealth: Payer: Self-pay

## 2014-07-24 ENCOUNTER — Encounter: Payer: Self-pay | Admitting: Gynecology

## 2014-07-24 NOTE — Telephone Encounter (Deleted)
Email received today "The medicine Dr. Toney Rakes sent to [facility] for the estrogen cream (Estrace Vag Cream) had a copay of over $200 and I am not able to pay that. Maybe I will need to get a renewed prescription from the [facility] in [place] if he cannot come up with another one that I can afford. I will be back in [place] on Oct 28 and can get another prescription if I can pay for it. Thank you for your help.

## 2014-07-24 NOTE — Telephone Encounter (Signed)
Error/void

## 2014-09-18 ENCOUNTER — Other Ambulatory Visit: Payer: Self-pay | Admitting: Gynecology

## 2015-03-10 ENCOUNTER — Other Ambulatory Visit: Payer: Self-pay | Admitting: Gynecology

## 2015-03-10 ENCOUNTER — Ambulatory Visit
Admission: RE | Admit: 2015-03-10 | Discharge: 2015-03-10 | Disposition: A | Discharge status: Home / Self Care | Payer: Medicare Other | Source: Ambulatory Visit | Attending: Gynecology | Admitting: Gynecology

## 2015-03-10 DIAGNOSIS — Z1231 Encounter for screening mammogram for malignant neoplasm of breast: Secondary | ICD-10-CM

## 2015-03-10 DIAGNOSIS — R921 Mammographic calcification found on diagnostic imaging of breast: Secondary | ICD-10-CM

## 2015-03-31 ENCOUNTER — Other Ambulatory Visit: Payer: Self-pay

## 2015-06-02 ENCOUNTER — Encounter: Payer: Self-pay | Admitting: Gynecology

## 2015-06-03 ENCOUNTER — Encounter: Payer: Self-pay | Admitting: Gynecology

## 2015-06-03 ENCOUNTER — Ambulatory Visit (INDEPENDENT_AMBULATORY_CARE_PROVIDER_SITE_OTHER): Payer: Medicare Other

## 2015-06-03 ENCOUNTER — Ambulatory Visit (INDEPENDENT_AMBULATORY_CARE_PROVIDER_SITE_OTHER): Payer: Medicare Other | Admitting: Gynecology

## 2015-06-03 ENCOUNTER — Other Ambulatory Visit: Payer: Self-pay | Admitting: Gynecology

## 2015-06-03 VITALS — BP 140/80

## 2015-06-03 DIAGNOSIS — R102 Pelvic and perineal pain: Secondary | ICD-10-CM

## 2015-06-03 DIAGNOSIS — D251 Intramural leiomyoma of uterus: Secondary | ICD-10-CM

## 2015-06-03 DIAGNOSIS — N8331 Acquired atrophy of ovary: Secondary | ICD-10-CM | POA: Diagnosis not present

## 2015-06-03 DIAGNOSIS — Z79890 Hormone replacement therapy (postmenopausal): Secondary | ICD-10-CM | POA: Diagnosis not present

## 2015-06-03 DIAGNOSIS — N83319 Acquired atrophy of ovary, unspecified side: Secondary | ICD-10-CM

## 2015-06-03 DIAGNOSIS — N898 Other specified noninflammatory disorders of vagina: Secondary | ICD-10-CM | POA: Diagnosis not present

## 2015-06-03 DIAGNOSIS — N952 Postmenopausal atrophic vaginitis: Secondary | ICD-10-CM

## 2015-06-03 LAB — URINALYSIS W MICROSCOPIC + REFLEX CULTURE
BILIRUBIN URINE: NEGATIVE
Casts: NONE SEEN [LPF]
Crystals: NONE SEEN [HPF]
Glucose, UA: NEGATIVE
Ketones, ur: NEGATIVE
Leukocytes, UA: NEGATIVE
NITRITE: NEGATIVE
Specific Gravity, Urine: 1.015 (ref 1.001–1.035)
Yeast: NONE SEEN [HPF]
pH: 6.5 (ref 5.0–8.0)

## 2015-06-03 MED ORDER — NONFORMULARY OR COMPOUNDED ITEM: Status: AC

## 2015-06-03 NOTE — Progress Notes (Signed)
   Patient is a 69 year old presented to the office today complaining of vaginal dryness irritation and low mid abdominal suprapubic like pains on and off. Patient with psoriatic arthritis and history of hemachromatosis, with history of hepatomegaly who has had similar symptoms in the past. In 2011 she had an ultrasound done in our office which was reported to be normal.  Exam: Blood pressure 140/80 Gen. appearance Caucasian patient with advanced age in no acute distress Back: No CVA tenderness Abdomen: Soft nontender no rebound or guarding midline scar was noted Pelvic exam: External genitalia atrophic changes Vagina: Atrophic changes Cervix: Atrophic changes Uterus: Axial although difficult to determine size due to patient's mild abdominal girth but no tenderness elicited Adnexa: Same as above Rectal vaginal exam nontender no masses noted  Urinalysis: 0-2 WBC, 3-10 RBC, few bacteria  Ultrasound today: Uterus measured 5.5 x 2.8 x 3.0 cm with endometrial stripe at 2.1 mm. A small intramural fibroid measuring 13 x 11 mm was noted. Right and left ovary were atrophic with normal echo pattern or apparent adnexal masses were seen.  Assessment/plan: Nonspecific low abdominal discomfort. No evidence of urinary tract infection although we are going to check her culture and will notify her days. Small intramural fibroid not causing discomfort. Patient was no acute distress. Reports normal bowel movements. Patient was reassured will continue to monitor if any changes she'll report back to the office. She was prescribed vaginal estrogen to apply twice a week for her vaginal atrophy.

## 2015-06-05 LAB — URINE CULTURE
Colony Count: NO GROWTH
ORGANISM ID, BACTERIA: NO GROWTH

## 2015-07-25 ENCOUNTER — Encounter: Payer: Self-pay | Admitting: Gynecology

## 2015-07-30 ENCOUNTER — Encounter: Payer: Medicare Other | Admitting: Gynecology

## 2015-08-23 ENCOUNTER — Other Ambulatory Visit: Payer: Self-pay | Admitting: Gynecology

## 2016-05-14 ENCOUNTER — Other Ambulatory Visit: Payer: Self-pay | Admitting: Internal Medicine

## 2016-05-14 DIAGNOSIS — Z1231 Encounter for screening mammogram for malignant neoplasm of breast: Secondary | ICD-10-CM

## 2016-05-24 ENCOUNTER — Ambulatory Visit: Payer: Medicare Other

## 2016-06-01 ENCOUNTER — Ambulatory Visit
Admission: RE | Admit: 2016-06-01 | Discharge: 2016-06-01 | Disposition: A | Discharge status: Home / Self Care | Payer: Medicare Other | Source: Ambulatory Visit | Attending: Internal Medicine | Admitting: Internal Medicine

## 2016-06-01 DIAGNOSIS — Z1231 Encounter for screening mammogram for malignant neoplasm of breast: Secondary | ICD-10-CM

## 2016-08-28 ENCOUNTER — Other Ambulatory Visit: Payer: Self-pay | Admitting: Gynecology

## 2017-02-09 ENCOUNTER — Ambulatory Visit: Payer: Medicare Other | Attending: Urology | Admitting: Physical Therapy

## 2017-02-09 DIAGNOSIS — M6281 Muscle weakness (generalized): Secondary | ICD-10-CM | POA: Insufficient documentation

## 2017-02-09 DIAGNOSIS — M0579 Rheumatoid arthritis with rheumatoid factor of multiple sites without organ or systems involvement: Secondary | ICD-10-CM | POA: Diagnosis present

## 2017-02-09 DIAGNOSIS — R262 Difficulty in walking, not elsewhere classified: Secondary | ICD-10-CM | POA: Diagnosis present

## 2017-02-09 DIAGNOSIS — M2560 Stiffness of unspecified joint, not elsewhere classified: Secondary | ICD-10-CM | POA: Insufficient documentation

## 2017-02-10 ENCOUNTER — Encounter: Payer: Self-pay | Admitting: Physical Therapy

## 2017-02-10 NOTE — Therapy (Addendum)
[facility] [address] [place], [place], [address] Phone: [phone]   Fax:  [phone]  Physical Therapy Evaluation  Patient Details  Name: [name] MRN: [id number] Date of Birth: 1945-12-02 Referring Provider: Maricela Curet, MD  Encounter Date: 02/09/2017        PT End of Session - 02/10/17 1813    Visit Number 1   Number of Visits 8   Date for PT Re-Evaluation 03/09/17   Authorization - Visit Number 1   Authorization - Number of Visits 10   PT Start Time 8338   PT Stop Time 1130   PT Time Calculation (min) 61 min   Equipment Utilized During Treatment Gait belt   Activity Tolerance Patient limited by pain;Patient limited by fatigue;Patient tolerated treatment well   Behavior During Therapy [facility] for tasks assessed/performed       Past Medical History:  Diagnosis Date  . Candida esophagitis ([place])   . Carpal tunnel syndrome   . Diabetes mellitus    type II  . Dysplasia of cervix, low grade (CIN 1)   . Esophageal yeast infection ([place]) 2011  . Family history of malignant neoplasm of gastrointestinal tract   . Fatty liver   . Fibromyalgia   . Fundic gland polyps of stomach, benign   . Gastritis   . GERD (gastroesophageal reflux disease)   . Hemochromatosis   . Herpes   . Hypertension   . Hypothyroidism   . IBS (irritable bowel syndrome)   . Neuropathy   . Pelvic adhesions   . Peripheral neuropathy   . Psoriatic arthritis ([place])   . Sleep apnea    no CPAP used  . Unspecified gastritis and gastroduodenitis without mention of hemorrhage     Past Surgical History:  Procedure Laterality Date  . APPENDECTOMY     dx lap   . BACK SURGERY     L1-L5  . CERVICAL FUSION     c5-c7  . DL LEFT SALPINGECTOMY LYSIS OF ADHESIONS, RIGHT BTL    . FOOT SURGERY    . lap chloecystectomy    . PELVIC LAPAROSCOPY    . TUBAL LIGATION     RIGHT    There were no vitals filed for this visit.     Pt. c/o B knee to  feet neuropathy.  Pt. lives by herself and reports a fall 2 weeks ago while reaching for something.  Pt. able to get up independently.  Pt. states vacuuming is difficult.   Pt. reports 10/10 LBP with walking/ shopping.       See HEP    Pt. is a pleasant 71 y/o female with recent h/o falls and chronic h/o 10/10 LBP with walking/ prolonged standing activities.  C-spine limited 50% with rotn.  B shoulder AROM WFL (sh. flexion 4+/5 MMT)- increaese pain.  B LE AROM WFL and strength grossly 5/5 MMT (easily fatigued/ increase c/o pain).  Forward head/ kyphotic posture in sitting and standing.  Pt. ambulates with short stridelength with decrease heel strike.  LEFS: 13 out fo 80.  Berg balance test: 44/56.  Discussed benefits from use of RW or SPC to decrease fall risk.  Pt. will benefit from skilled PT services to increase B LE muscle endurance/ posture to improve balance and independence with gait.           PT Long Term Goals - 02/10/17 0705      PT LONG TERM GOAL #1   Title Pt.  independent with HEP to increase LE muscle strength/endurance to WNL to improve walking/ decrease fall risk.     Baseline Currently not exercising.  Poor endurance/ requires seated rest breaks.    Time 4   Period Weeks   Status New     PT LONG TERM GOAL #2   Title Pt. will increase LEFS to >40 out of 80 to improve pain-free mobility/ gait.     Baseline LEFS: 13 out of 80.     Time 4   Period Weeks   Status New     PT LONG TERM GOAL #3   Title Pt. will increase Berg balance test to >50/56 to decrease fall risk/ improve gait.     Baseline Berg 44/56   Time 4   Period Weeks   Status New     PT LONG TERM GOAL #4   Title Pt. able to ambulate with more normalized gait pattern and appropriate assistive device to improve community mobility/ shopping.    Baseline Short stridelength with decrease heel strike.    Time 4   Period Weeks   Status New     PT LONG TERM GOAL #5   Title Pt. will progress to Silver  Sneakers/ independent gym based ex. program with no limitations safely.     Baseline Pt. will call to check on Silver Sneakers status.    Time 4   Period Weeks   Status New             Patient will benefit from skilled therapeutic intervention in order to improve the following deficits and impairments:  Abnormal gait, Pain, Decreased coordination, Decreased mobility, Postural dysfunction, Decreased strength, Decreased range of motion, Hypomobility, Decreased endurance, Decreased activity tolerance, Decreased balance, Decreased safety awareness, Difficulty walking, Impaired flexibility  Visit Diagnosis: Rheumatoid arthritis involving multiple sites with positive rheumatoid factor (HCC)  Muscle weakness  Joint stiffness  Difficulty in walking, not elsewhere classified     Problem List Patient Active Problem List   Diagnosis Date Noted  . Vaginal atrophy 05/14/2013  . Carpal tunnel syndrome   . Drug-induced constipation 06/15/2011  . GERD (gastroesophageal reflux disease) 06/15/2011  . Dysplasia of cervix, low grade (CIN 1)   . Pelvic adhesions   . Herpes   . Peripheral neuropathy   . Diabetes mellitus   . Hemochromatosis   . CANDIDIASIS, ESOPHAGEAL 12/11/2010  . OBSTRUCTIVE SLEEP APNEA 12/07/2010  . HYPERGLYCEMIA 09/25/2010  . NAUSEA 09/08/2010  . ABDOMINAL PAIN, EPIGASTRIC 09/08/2010  . DIARRHEA 06/23/2010  . HEPATOMEGALY 06/23/2010  . 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 PERIPHERAL NEUROPATHY 02/22/2008  . HYPERTENSION 02/22/2008  . GERD 02/22/2008  . FATTY LIVER DISEASE 02/22/2008  . RENAL CYST, LEFT 02/22/2008  . INTERSTITIAL CYSTITIS 02/22/2008  . UTI'S, RECURRENT 02/22/2008  . DEGENERATIVE JOINT DISEASE 02/22/2008  . HERNIATED LUMBOSACRAL DISC 02/22/2008  . [place] DISEASE, CERVICAL 02/22/2008  . FIBROMYALGIA 02/22/2008  . SLEEP APNEA 02/22/2008  . EARLY SATIETY 02/22/2008  . 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 GASTRITIS 01/24/2008  . IRRITABLE BOWEL SYNDROME  01/24/2008  . DEPRESSION 01/19/2008  . GASTROPARESIS 01/19/2008  . MENINGIOMA 05/22/1996   Pura Spice, PT, DPT # [id number] 02/10/2017, 7:09 AM  [facility] [address] [place], [place], [address] Phone: [phone]   Fax:  [phone]  Name: [name] MRN: [id number] Date of Birth: [birth year]

## 2017-02-16 ENCOUNTER — Encounter: Payer: Self-pay | Admitting: Gynecology

## 2017-02-17 ENCOUNTER — Ambulatory Visit: Payer: Medicare Other | Admitting: Physical Therapy

## 2017-02-17 DIAGNOSIS — M0579 Rheumatoid arthritis with rheumatoid factor of multiple sites without organ or systems involvement: Secondary | ICD-10-CM | POA: Diagnosis not present

## 2017-02-17 DIAGNOSIS — R262 Difficulty in walking, not elsewhere classified: Secondary | ICD-10-CM

## 2017-02-17 DIAGNOSIS — M6281 Muscle weakness (generalized): Secondary | ICD-10-CM

## 2017-02-17 DIAGNOSIS — M2560 Stiffness of unspecified joint, not elsewhere classified: Secondary | ICD-10-CM

## 2017-02-17 NOTE — Therapy (Addendum)
[facility] [address] [place], [place], [address] Phone: [phone]   Fax:  [phone]  Physical Therapy Treatment  Patient Details  Name: [name] MRN: [id number] Date of Birth: [birth year] Referring Provider: Maricela Curet, MD  Encounter Date: 02/17/2017        PT End of Session - 02/18/17 1615    Visit Number 2   Number of Visits 8   Date for PT Re-Evaluation 03/09/17   Authorization - Visit Number 2   Authorization - Number of Visits 10   PT Start Time 2694   PT Stop Time 8546   PT Time Calculation (min) 67 min   Equipment Utilized During Treatment Gait belt   Activity Tolerance Patient limited by pain;Patient limited by fatigue;Patient tolerated treatment well   Behavior During Therapy [facility] for tasks assessed/performed        Past Medical History:  Diagnosis Date  . Candida esophagitis ([place])   . Carpal tunnel syndrome   . Diabetes mellitus    type II  . Dysplasia of cervix, low grade (CIN 1)   . Esophageal yeast infection ([place]) 2011  . Family history of malignant neoplasm of gastrointestinal tract   . Fatty liver   . Fibromyalgia   . Fundic gland polyps of stomach, benign   . Gastritis   . GERD (gastroesophageal reflux disease)   . Hemochromatosis   . Herpes   . Hypertension   . Hypothyroidism   . IBS (irritable bowel syndrome)   . Neuropathy   . Pelvic adhesions   . Peripheral neuropathy   . Psoriatic arthritis ([place])   . Sleep apnea    no CPAP used  . Unspecified gastritis and gastroduodenitis without mention of hemorrhage     Past Surgical History:  Procedure Laterality Date  . APPENDECTOMY     dx lap   . BACK SURGERY     L1-L5  . CERVICAL FUSION     c5-c7  . DL LEFT SALPINGECTOMY LYSIS OF ADHESIONS, RIGHT BTL    . FOOT SURGERY    . lap chloecystectomy    . PELVIC LAPAROSCOPY    . TUBAL LIGATION     RIGHT    There were no vitals filed for this visit.    Pt. states she fell  on Sunday night while trying to kill a spider.  Fell on L side (bruise on L side of foot/knee).  Pt. going to see Neurologist in [facility] next Friday to discuss PLS.  Pt. enjoys coloring adult coloring books and working with floors.      OBJECTIVE:  Neuro. Mm.:  Alt. UE/LE in sitting and standing posture.  SLS/ tandem stance and gait.  STS 5x2.  There.ex.: Reviewed HEP.  Bridging 20x.  Step ups/downs with 1 UE assist. Standing heel raises/ marching/ hip abd. 20x.  Gait training: amb. In clinic with cuing to increase stride length/ heel strike and toe.  Pt. Requires cuing to increase posture/ proper head position.      Pt response for medical necessity:  Benefits from skilled PT to increase LE strength/ balance to improve safety and independent with gait.          Pt. has difficulty with tandem/ SLS.  PT discussed the importance of a consistnet exercise program and reviewed the equipment the pt. has access to (TM/ bikes).            PT Long Term Goals - 02/10/17 2703  PT LONG TERM GOAL #1   Title Pt. independent with HEP to increase LE muscle strength/endurance to WNL to improve walking/ decrease fall risk.     Baseline Currently not exercising.  Poor endurance/ requires seated rest breaks.    Time 4   Period Weeks   Status New     PT LONG TERM GOAL #2   Title Pt. will increase LEFS to >40 out of 80 to improve pain-free mobility/ gait.     Baseline LEFS: 13 out of 80.     Time 4   Period Weeks   Status New     PT LONG TERM GOAL #3   Title Pt. will increase Berg balance test to >50/56 to decrease fall risk/ improve gait.     Baseline Berg 44/56   Time 4   Period Weeks   Status New     PT LONG TERM GOAL #4   Title Pt. able to ambulate with more normalized gait pattern and appropriate assistive device to improve community mobility/ shopping.    Baseline Short stridelength with decrease heel strike.    Time 4   Period Weeks   Status New     PT LONG TERM GOAL #5    Title Pt. will progress to Silver Sneakers/ independent gym based ex. program with no limitations safely.     Baseline Pt. will call to check on Silver Sneakers status.    Time 4   Period Weeks   Status New             Patient will benefit from skilled therapeutic intervention in order to improve the following deficits and impairments:  Abnormal gait, Pain, Decreased coordination, Decreased mobility, Postural dysfunction, Decreased strength, Decreased range of motion, Hypomobility, Decreased endurance, Decreased activity tolerance, Decreased balance, Decreased safety awareness, Difficulty walking, Impaired flexibility  Visit Diagnosis: Rheumatoid arthritis involving multiple sites with positive rheumatoid factor (HCC)  Muscle weakness  Joint stiffness  Difficulty in walking, not elsewhere classified     Problem List Patient Active Problem List   Diagnosis Date Noted  . Vaginal atrophy 05/14/2013  . Carpal tunnel syndrome   . Drug-induced constipation 06/15/2011  . GERD (gastroesophageal reflux disease) 06/15/2011  . Dysplasia of cervix, low grade (CIN 1)   . Pelvic adhesions   . Herpes   . Peripheral neuropathy   . Diabetes mellitus   . Hemochromatosis   . CANDIDIASIS, ESOPHAGEAL 12/11/2010  . OBSTRUCTIVE SLEEP APNEA 12/07/2010  . HYPERGLYCEMIA 09/25/2010  . NAUSEA 09/08/2010  . ABDOMINAL PAIN, EPIGASTRIC 09/08/2010  . DIARRHEA 06/23/2010  . HEPATOMEGALY 06/23/2010  . 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 PERIPHERAL NEUROPATHY 02/22/2008  . HYPERTENSION 02/22/2008  . GERD 02/22/2008  . FATTY LIVER DISEASE 02/22/2008  . RENAL CYST, LEFT 02/22/2008  . INTERSTITIAL CYSTITIS 02/22/2008  . UTI'S, RECURRENT 02/22/2008  . DEGENERATIVE JOINT DISEASE 02/22/2008  . HERNIATED LUMBOSACRAL DISC 02/22/2008  . [place] DISEASE, CERVICAL 02/22/2008  . FIBROMYALGIA 02/22/2008  . SLEEP APNEA 02/22/2008  . EARLY SATIETY 02/22/2008  . 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 GASTRITIS 01/24/2008   . IRRITABLE BOWEL SYNDROME 01/24/2008  . DEPRESSION 01/19/2008  . GASTROPARESIS 01/19/2008  . MENINGIOMA 05/22/1996   Pura Spice, PT, DPT # [id number] 03/11/2017, 8:39 AM  [facility] [address] [place], [place], [address] Phone: [phone]   Fax:  [phone]  Name: [name] MRN: [id number] Date of Birth: 1946-06-24

## 2017-02-22 ENCOUNTER — Encounter: Payer: Self-pay | Admitting: Physical Therapy

## 2017-02-22 ENCOUNTER — Ambulatory Visit: Payer: Medicare Other | Admitting: Physical Therapy

## 2017-02-22 DIAGNOSIS — M6281 Muscle weakness (generalized): Secondary | ICD-10-CM

## 2017-02-22 DIAGNOSIS — M0579 Rheumatoid arthritis with rheumatoid factor of multiple sites without organ or systems involvement: Secondary | ICD-10-CM

## 2017-02-22 DIAGNOSIS — R262 Difficulty in walking, not elsewhere classified: Secondary | ICD-10-CM

## 2017-02-22 DIAGNOSIS — M2560 Stiffness of unspecified joint, not elsewhere classified: Secondary | ICD-10-CM

## 2017-02-23 NOTE — Therapy (Addendum)
[facility] [address] [place], [place], [address] Phone: [phone]   Fax:  [phone]  Physical Therapy Treatment  Patient Details  Name: [name] MRN: [id number] Date of Birth: Jan 10, 1946 Referring Provider: Maricela Curet, MD  Encounter Date: 02/22/2017        PT End of Session - 02/23/17 1807    Visit Number 3   Number of Visits 8   Date for PT Re-Evaluation 03/09/17   Authorization - Visit Number 3   Authorization - Number of Visits 10   PT Start Time 1301   PT Stop Time 1638   PT Time Calculation (min) 54 min   Equipment Utilized During Treatment Gait belt   Activity Tolerance Patient limited by pain;Patient limited by fatigue;Patient tolerated treatment well   Behavior During Therapy [facility] for tasks assessed/performed        Past Medical History:  Diagnosis Date  . Candida esophagitis ([place])   . Carpal tunnel syndrome   . Diabetes mellitus    type II  . Dysplasia of cervix, low grade (CIN 1)   . Esophageal yeast infection ([place]) 2011  . Family history of malignant neoplasm of gastrointestinal tract   . Fatty liver   . Fibromyalgia   . Fundic gland polyps of stomach, benign   . Gastritis   . GERD (gastroesophageal reflux disease)   . Hemochromatosis   . Herpes   . Hypertension   . Hypothyroidism   . IBS (irritable bowel syndrome)   . Neuropathy   . Pelvic adhesions   . Peripheral neuropathy   . Psoriatic arthritis ([place])   . Sleep apnea    no CPAP used  . Unspecified gastritis and gastroduodenitis without mention of hemorrhage     Past Surgical History:  Procedure Laterality Date  . APPENDECTOMY     dx lap   . BACK SURGERY     L1-L5  . CERVICAL FUSION     c5-c7  . DL LEFT SALPINGECTOMY LYSIS OF ADHESIONS, RIGHT BTL    . FOOT SURGERY    . lap chloecystectomy    . PELVIC LAPAROSCOPY    . TUBAL LIGATION     RIGHT    There were no vitals filed for this visit.    No falls but several  LOB while walking outside in yard/ up and down curb.  Pt. reports cramps in B knees to ankles (shin region).       OBJECTIVE:  Neuro. Mm.:  Alt. UE/LE in sitting and standing posture.  Standing B shoulder flexion with posture correction.  SLS/ tandem stance and gait.  STS 5x2.  Functional reaching to cones (anterior LOB).   There.ex.: Discussed HEP. Bridging 20x.  Step ups/downs with 1 UE assist. Standing heel raises/ marching/ hip abd. 20x.  Ambulate outside on grassy terrain.    Resisted gait 1BTB 10x all 4-planes.                Pt response for medical necessity:  Benefits from skilled PT to increase LE strength/ balance to improve safety and independent with gait.        Pt. demonstrates ability to walk outside with SBA/CGA to maneuver around grassy terrain.  Pt. has difficulty with functional reaching tasks (anterior LOB).            PT Long Term Goals - 02/10/17 0705      PT LONG TERM GOAL #1   Title Pt. independent with HEP  to increase LE muscle strength/endurance to WNL to improve walking/ decrease fall risk.     Baseline Currently not exercising.  Poor endurance/ requires seated rest breaks.    Time 4   Period Weeks   Status New     PT LONG TERM GOAL #2   Title Pt. will increase LEFS to >40 out of 80 to improve pain-free mobility/ gait.     Baseline LEFS: 13 out of 80.     Time 4   Period Weeks   Status New     PT LONG TERM GOAL #3   Title Pt. will increase Berg balance test to >50/56 to decrease fall risk/ improve gait.     Baseline Berg 44/56   Time 4   Period Weeks   Status New     PT LONG TERM GOAL #4   Title Pt. able to ambulate with more normalized gait pattern and appropriate assistive device to improve community mobility/ shopping.    Baseline Short stridelength with decrease heel strike.    Time 4   Period Weeks   Status New     PT LONG TERM GOAL #5   Title Pt. will progress to Silver Sneakers/ independent gym based ex. program with no  limitations safely.     Baseline Pt. will call to check on Silver Sneakers status.    Time 4   Period Weeks   Status New             Patient will benefit from skilled therapeutic intervention in order to improve the following deficits and impairments:  Abnormal gait, Pain, Decreased coordination, Decreased mobility, Postural dysfunction, Decreased strength, Decreased range of motion, Hypomobility, Decreased endurance, Decreased activity tolerance, Decreased balance, Decreased safety awareness, Difficulty walking, Impaired flexibility  Visit Diagnosis: Rheumatoid arthritis involving multiple sites with positive rheumatoid factor (HCC)  Muscle weakness  Joint stiffness  Difficulty in walking, not elsewhere classified     Problem List Patient Active Problem List   Diagnosis Date Noted  . Vaginal atrophy 05/14/2013  . Carpal tunnel syndrome   . Drug-induced constipation 06/15/2011  . GERD (gastroesophageal reflux disease) 06/15/2011  . Dysplasia of cervix, low grade (CIN 1)   . Pelvic adhesions   . Herpes   . Peripheral neuropathy   . Diabetes mellitus   . Hemochromatosis   . CANDIDIASIS, ESOPHAGEAL 12/11/2010  . OBSTRUCTIVE SLEEP APNEA 12/07/2010  . HYPERGLYCEMIA 09/25/2010  . NAUSEA 09/08/2010  . ABDOMINAL PAIN, EPIGASTRIC 09/08/2010  . DIARRHEA 06/23/2010  . HEPATOMEGALY 06/23/2010  . 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 PERIPHERAL NEUROPATHY 02/22/2008  . HYPERTENSION 02/22/2008  . GERD 02/22/2008  . FATTY LIVER DISEASE 02/22/2008  . RENAL CYST, LEFT 02/22/2008  . INTERSTITIAL CYSTITIS 02/22/2008  . UTI'S, RECURRENT 02/22/2008  . DEGENERATIVE JOINT DISEASE 02/22/2008  . HERNIATED LUMBOSACRAL DISC 02/22/2008  . [place] DISEASE, CERVICAL 02/22/2008  . FIBROMYALGIA 02/22/2008  . SLEEP APNEA 02/22/2008  . EARLY SATIETY 02/22/2008  . 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 GASTRITIS 01/24/2008  . IRRITABLE BOWEL SYNDROME 01/24/2008  . DEPRESSION 01/19/2008  . GASTROPARESIS  01/19/2008  . MENINGIOMA 05/22/1996   Pura Spice, PT, DPT # [phone] 02/23/2017, 9:24 AM   [facility] [address] [place], [place], [address] Phone: [phone]   Fax:  [phone]  Name: FOYE HAGGART MRN: [id number] Date of Birth: 1946/01/21

## 2017-02-24 ENCOUNTER — Ambulatory Visit: Payer: Medicare Other | Admitting: Physical Therapy

## 2017-02-24 DIAGNOSIS — M2560 Stiffness of unspecified joint, not elsewhere classified: Secondary | ICD-10-CM

## 2017-02-24 DIAGNOSIS — M6281 Muscle weakness (generalized): Secondary | ICD-10-CM

## 2017-02-24 DIAGNOSIS — M0579 Rheumatoid arthritis with rheumatoid factor of multiple sites without organ or systems involvement: Secondary | ICD-10-CM

## 2017-02-24 DIAGNOSIS — R262 Difficulty in walking, not elsewhere classified: Secondary | ICD-10-CM

## 2017-02-25 NOTE — Therapy (Addendum)
[facility] [address] [place], [place], [address] Phone: [phone]   Fax:  [phone]  Physical Therapy Treatment  Patient Details  Name: [name] MRN: [id number] Date of Birth: 1946/03/12 Referring Provider: Maricela Curet, MD  Encounter Date: 02/24/2017        PT End of Session - 02/25/17 1448    Visit Number 4   Number of Visits 8   Date for PT Re-Evaluation 03/09/17   Authorization - Visit Number 4   Authorization - Number of Visits 10   PT Start Time 8185   PT Stop Time 1400   PT Time Calculation (min) 58 min   Equipment Utilized During Treatment Gait belt   Activity Tolerance Patient limited by pain;Patient limited by fatigue;Patient tolerated treatment well   Behavior During Therapy [facility] for tasks assessed/performed       Past Medical History:  Diagnosis Date  . Candida esophagitis ([place])   . Carpal tunnel syndrome   . Diabetes mellitus    type II  . Dysplasia of cervix, low grade (CIN 1)   . Esophageal yeast infection ([place]) 2011  . Family history of malignant neoplasm of gastrointestinal tract   . Fatty liver   . Fibromyalgia   . Fundic gland polyps of stomach, benign   . Gastritis   . GERD (gastroesophageal reflux disease)   . Hemochromatosis   . Herpes   . Hypertension   . Hypothyroidism   . IBS (irritable bowel syndrome)   . Neuropathy   . Pelvic adhesions   . Peripheral neuropathy   . Psoriatic arthritis ([place])   . Sleep apnea    no CPAP used  . Unspecified gastritis and gastroduodenitis without mention of hemorrhage     Past Surgical History:  Procedure Laterality Date  . APPENDECTOMY     dx lap   . BACK SURGERY     L1-L5  . CERVICAL FUSION     c5-c7  . DL LEFT SALPINGECTOMY LYSIS OF ADHESIONS, RIGHT BTL    . FOOT SURGERY    . lap chloecystectomy    . PELVIC LAPAROSCOPY    . TUBAL LIGATION     RIGHT    There were no vitals filed for this visit.    Pt. states she is  hurting all over (shoulders/ elbows/ knees/ ankles)- 6/10 pain.  Pt. is doing to Neurologis tomorrow.        OBJECTIVE: Neuro. Mm.: Alt. UE/LE in sitting and standing posture.  Standing B shoulder flexion with posture correction. SLS/ tandem stance and gait. Hallway high marching/ lateral walking/ braiding (forward).  Step touches with toe/heel and 1 UE assist. Airex step ups/ marching.  There.ex.:  Nustep L5 10 min. Standing heel raises/ marching/ hip abd. 20x.  Gait pattern: Ambulate in clinic/outside on varying terrain.  Stair training.     Pt response for medical necessity: Benefits from skilled PT to increase LE strength/ balance to improve safety and independent with gait.      Pt. didn't c/o any increase in pain symptoms with progression of balance tasks.  Pt. requires extra time and CGA for safety with //-bars ex.  Difficulty with tandem gait/ braiding.  Pt. able to complete Nustep with consistent cadence and no increase c/o pain.          PT Long Term Goals - 02/10/17 0705      PT LONG TERM GOAL #1   Title Pt. independent with HEP to increase  LE muscle strength/endurance to WNL to improve walking/ decrease fall risk.     Baseline Currently not exercising.  Poor endurance/ requires seated rest breaks.    Time 4   Period Weeks   Status New     PT LONG TERM GOAL #2   Title Pt. will increase LEFS to >40 out of 80 to improve pain-free mobility/ gait.     Baseline LEFS: 13 out of 80.     Time 4   Period Weeks   Status New     PT LONG TERM GOAL #3   Title Pt. will increase Berg balance test to >50/56 to decrease fall risk/ improve gait.     Baseline Berg 44/56   Time 4   Period Weeks   Status New     PT LONG TERM GOAL #4   Title Pt. able to ambulate with more normalized gait pattern and appropriate assistive device to improve community mobility/ shopping.    Baseline Short stridelength with decrease heel strike.    Time 4   Period Weeks   Status  New     PT LONG TERM GOAL #5   Title Pt. will progress to Silver Sneakers/ independent gym based ex. program with no limitations safely.     Baseline Pt. will call to check on Silver Sneakers status.    Time 4   Period Weeks   Status New         Patient will benefit from skilled therapeutic intervention in order to improve the following deficits and impairments:  Abnormal gait, Pain, Decreased coordination, Decreased mobility, Postural dysfunction, Decreased strength, Decreased range of motion, Hypomobility, Decreased endurance, Decreased activity tolerance, Decreased balance, Decreased safety awareness, Difficulty walking, Impaired flexibility  Visit Diagnosis: Rheumatoid arthritis involving multiple sites with positive rheumatoid factor (HCC)  Muscle weakness  Joint stiffness  Difficulty in walking, not elsewhere classified     Problem List Patient Active Problem List   Diagnosis Date Noted  . Vaginal atrophy 05/14/2013  . Carpal tunnel syndrome   . Drug-induced constipation 06/15/2011  . GERD (gastroesophageal reflux disease) 06/15/2011  . Dysplasia of cervix, low grade (CIN 1)   . Pelvic adhesions   . Herpes   . Peripheral neuropathy   . Diabetes mellitus   . Hemochromatosis   . CANDIDIASIS, ESOPHAGEAL 12/11/2010  . OBSTRUCTIVE SLEEP APNEA 12/07/2010  . HYPERGLYCEMIA 09/25/2010  . NAUSEA 09/08/2010  . ABDOMINAL PAIN, EPIGASTRIC 09/08/2010  . DIARRHEA 06/23/2010  . HEPATOMEGALY 06/23/2010  . 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 PERIPHERAL NEUROPATHY 02/22/2008  . HYPERTENSION 02/22/2008  . GERD 02/22/2008  . FATTY LIVER DISEASE 02/22/2008  . RENAL CYST, LEFT 02/22/2008  . INTERSTITIAL CYSTITIS 02/22/2008  . UTI'S, RECURRENT 02/22/2008  . DEGENERATIVE JOINT DISEASE 02/22/2008  . HERNIATED LUMBOSACRAL DISC 02/22/2008  . [place] DISEASE, CERVICAL 02/22/2008  . FIBROMYALGIA 02/22/2008  . SLEEP APNEA 02/22/2008  . EARLY SATIETY 02/22/2008  . HEMOCHROMATOSIS  02/22/2008  . GASTRITIS 01/24/2008  . IRRITABLE BOWEL SYNDROME 01/24/2008  . DEPRESSION 01/19/2008  . GASTROPARESIS 01/19/2008  . MENINGIOMA 05/22/1996   Pura Spice, PT, DPT # [phone] 02/25/17, 10:24 AM  [facility] [address] [place], [place], [address] Phone: [phone]   Fax:  [phone]  Name: EARLA CHARLIE MRN: [id number] Date of Birth: 1946-04-23

## 2017-03-01 ENCOUNTER — Encounter: Payer: Medicare Other | Admitting: Physical Therapy

## 2017-03-02 ENCOUNTER — Ambulatory Visit: Payer: Medicare Other | Admitting: Physical Therapy

## 2017-03-02 DIAGNOSIS — M0579 Rheumatoid arthritis with rheumatoid factor of multiple sites without organ or systems involvement: Secondary | ICD-10-CM

## 2017-03-02 DIAGNOSIS — R262 Difficulty in walking, not elsewhere classified: Secondary | ICD-10-CM

## 2017-03-02 DIAGNOSIS — M2560 Stiffness of unspecified joint, not elsewhere classified: Secondary | ICD-10-CM

## 2017-03-02 DIAGNOSIS — M6281 Muscle weakness (generalized): Secondary | ICD-10-CM

## 2017-03-03 NOTE — Therapy (Addendum)
[facility] [facility] [address]. [place], [place], [address] Phone: [phone]   Fax:  [phone]  Physical Therapy Treatment  Patient Details  Name: [name] MRN: [id number] Date of Birth: 1946/01/22 Referring Provider: Maricela Curet, MD  Encounter Date: 03/02/2017      PT End of Session - 03/03/17 1244    Visit Number 5   Number of Visits 8   Date for PT Re-Evaluation 03/09/17   Authorization - Visit Number 5   Authorization - Number of Visits 10   PT Start Time 1101   PT Stop Time 1155   PT Time Calculation (min) 54 min   Equipment Utilized During Treatment Gait belt   Activity Tolerance Patient limited by pain;Patient limited by fatigue;Patient tolerated treatment well   Behavior During Therapy [facility] for tasks assessed/performed      Past Medical History:  Diagnosis Date  . Candida esophagitis ([place])   . Carpal tunnel syndrome   . Diabetes mellitus    type II  . Dysplasia of cervix, low grade (CIN 1)   . Esophageal yeast infection ([place]) 2011  . Family history of malignant neoplasm of gastrointestinal tract   . Fatty liver   . Fibromyalgia   . Fundic gland polyps of stomach, benign   . Gastritis   . GERD (gastroesophageal reflux disease)   . Hemochromatosis   . Herpes   . Hypertension   . Hypothyroidism   . IBS (irritable bowel syndrome)   . Neuropathy   . Pelvic adhesions   . Peripheral neuropathy   . Psoriatic arthritis ([place])   . Sleep apnea    no CPAP used  . Unspecified gastritis and gastroduodenitis without mention of hemorrhage     Past Surgical History:  Procedure Laterality Date  . APPENDECTOMY     dx lap   . BACK SURGERY     L1-L5  . CERVICAL FUSION     c5-c7  . DL LEFT SALPINGECTOMY LYSIS OF ADHESIONS, RIGHT BTL    . FOOT SURGERY    . lap chloecystectomy    . PELVIC LAPAROSCOPY    . TUBAL LIGATION     RIGHT    There were no vitals filed for this visit.       Pt. states R shoulder  is not hurting today but has generalized joint pain (3/10).       OBJECTIVE: Neuro. Mm.: Alt. UE/LE in sitting and standing posture. Cone taps/ crossing pattern with 1 to no UE assist.  Step ups/ downs with 1 to no UE assist.  Hallway high marching/ lateral walking/ braiding (forward).  Step touches with toe/heel and 1 UE assist. Airex step ups/ marching. There.ex.:  Nustep L5 10 min. (no R UE).  Standing heel raises/ marching/ hip abd. 20x.  Standing B UE AROM ex./  Reviewed HEP.  Gait pattern: Ambulate in clinic/outside on varying terrain. Cuing for posture correction/ core bracing while maneuvering obstacles outside.     Pt response for medical necessity: Benefits from skilled PT to increase LE strength/ balance to improve safety and independent with gait.        R UT muscle tenderness/ tightness noted with overhead reaching.  No increase c/o generalized joint pain reported durint ther.ex.  Pt. has scheduled MRI next Monday and MD f/u with Neurosurgeon on Tuesday.  Pt. able to complete cone taps/ crossing pattern balance tasks with no UE assist.          Patient  will benefit from skilled therapeutic intervention in order to improve the following deficits and impairments:     Visit Diagnosis: Rheumatoid arthritis involving multiple sites with positive rheumatoid factor (HCC)  Muscle weakness  Joint stiffness  Difficulty in walking, not elsewhere classified     Problem List Patient Active Problem List   Diagnosis Date Noted  . Vaginal atrophy 05/14/2013  . Carpal tunnel syndrome   . Drug-induced constipation 06/15/2011  . GERD (gastroesophageal reflux disease) 06/15/2011  . Dysplasia of cervix, low grade (CIN 1)   . Pelvic adhesions   . Herpes   . Peripheral neuropathy   . Diabetes mellitus   . Hemochromatosis   . CANDIDIASIS, ESOPHAGEAL 12/11/2010  . OBSTRUCTIVE SLEEP APNEA 12/07/2010  . HYPERGLYCEMIA 09/25/2010  . NAUSEA 09/08/2010  .  ABDOMINAL PAIN, EPIGASTRIC 09/08/2010  . DIARRHEA 06/23/2010  . HEPATOMEGALY 06/23/2010  . 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 PERIPHERAL NEUROPATHY 02/22/2008  . HYPERTENSION 02/22/2008  . GERD 02/22/2008  . FATTY LIVER DISEASE 02/22/2008  . RENAL CYST, LEFT 02/22/2008  . INTERSTITIAL CYSTITIS 02/22/2008  . UTI'S, RECURRENT 02/22/2008  . DEGENERATIVE JOINT DISEASE 02/22/2008  . HERNIATED LUMBOSACRAL DISC 02/22/2008  . [place] DISEASE, CERVICAL 02/22/2008  . FIBROMYALGIA 02/22/2008  . SLEEP APNEA 02/22/2008  . EARLY SATIETY 02/22/2008  . 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 GASTRITIS 01/24/2008  . IRRITABLE BOWEL SYNDROME 01/24/2008  . DEPRESSION 01/19/2008  . GASTROPARESIS 01/19/2008  . MENINGIOMA 05/22/1996   Pura Spice, PT, DPT # [phone] 03/03/2017, 12:45 PM  [facility] [address] [place], [place], [address] Phone: [phone]   Fax:  [phone]  Name: [name] MRN: [id number] Date of Birth: [birth year]

## 2017-03-08 ENCOUNTER — Ambulatory Visit: Payer: Medicare Other | Attending: Urology | Admitting: Physical Therapy

## 2017-03-08 DIAGNOSIS — R262 Difficulty in walking, not elsewhere classified: Secondary | ICD-10-CM | POA: Insufficient documentation

## 2017-03-08 DIAGNOSIS — M0579 Rheumatoid arthritis with rheumatoid factor of multiple sites without organ or systems involvement: Secondary | ICD-10-CM

## 2017-03-08 DIAGNOSIS — M2560 Stiffness of unspecified joint, not elsewhere classified: Secondary | ICD-10-CM | POA: Diagnosis present

## 2017-03-08 DIAGNOSIS — M6281 Muscle weakness (generalized): Secondary | ICD-10-CM | POA: Diagnosis present

## 2017-03-09 NOTE — Therapy (Addendum)
[place] [facility] [address]. [place], [place], [address] Phone: [phone]   Fax:  [phone]  Physical Therapy Treatment  Patient Details  Name: [name] MRN: [id number] Date of Birth: September 02, 1946 Referring Provider: Maricela Curet, MD  Encounter Date: 03/08/2017      PT End of Session - 03/09/17 1924    Visit Number 6   Number of Visits 8   Date for PT Re-Evaluation 03/09/17   Authorization - Visit Number 6   Authorization - Number of Visits 10   PT Start Time 9628   PT Stop Time 3662   PT Time Calculation (min) 53 min   Equipment Utilized During Treatment Gait belt   Activity Tolerance Treatment limited secondary to medical complications (Comment)   Behavior During Therapy [facility] for tasks assessed/performed      Past Medical History:  Diagnosis Date  . Candida esophagitis ([place])   . Carpal tunnel syndrome   . Diabetes mellitus    type II  . Dysplasia of cervix, low grade (CIN 1)   . Esophageal yeast infection ([place]) 2011  . Family history of malignant neoplasm of gastrointestinal tract   . Fatty liver   . Fibromyalgia   . Fundic gland polyps of stomach, benign   . Gastritis   . GERD (gastroesophageal reflux disease)   . Hemochromatosis   . Herpes   . Hypertension   . Hypothyroidism   . IBS (irritable bowel syndrome)   . Neuropathy   . Pelvic adhesions   . Peripheral neuropathy   . Psoriatic arthritis ([place])   . Sleep apnea    no CPAP used  . Unspecified gastritis and gastroduodenitis without mention of hemorrhage     Past Surgical History:  Procedure Laterality Date  . APPENDECTOMY     dx lap   . BACK SURGERY     L1-L5  . CERVICAL FUSION     c5-c7  . DL LEFT SALPINGECTOMY LYSIS OF ADHESIONS, RIGHT BTL    . FOOT SURGERY    . lap chloecystectomy    . PELVIC LAPAROSCOPY    . TUBAL LIGATION     RIGHT    There were no vitals filed for this visit.          Pt. states she is having a rough day  after completing MRI yesterday.  Pt. reports 9/10 R shoulder pain currently at rest.  Pt. is still off of RA meds and Rheumatologist/pt. still waiting on financial assist for meds.        OBJECTIVE: Neuro. Mm.: Hallway balance tasks: EC/ turning/ head position changes/ high marching.  Cone taps/ crossing pattern with 1 to no UE assist.  Step ups/ downs with no UE assist.  Alternating step touches with toe/heel and 1 UE assist. Airex step ups/ marching.There.ex.: Nustep L5 10 min. (no R UE).  Standing LE ex. With 3# ankle wts. (20x all planes).  Sit to stands 6x with no UE assist.  Gait training:Ambulate in clinic/outside on varyingterrain. Cuing for posture correction/ core bracing while maneuvering obstacles outside. Up/down curbs and ramps.     Pt response for medical necessity: Benefits from skilled PT to increase LE strength/ balance to improve safety and independent with gait.      Recert next tx. session.  Pt. limited with UE ex./ lifting tasks secondary to R shoulder pain.  Pt. progressing well with LE resisted ex./ gait training/ posture bracing during upright tasks.  Pt. states L  ankle feels like it is going to buckle during step touching tasks but no episodes of LOB noted.      Patient will benefit from skilled therapeutic intervention in order to improve the following deficits and impairments:     Visit Diagnosis: Rheumatoid arthritis involving multiple sites with positive rheumatoid factor (HCC)  Muscle weakness  Joint stiffness  Difficulty in walking, not elsewhere classified     Problem List Patient Active Problem List   Diagnosis Date Noted  . Vaginal atrophy 05/14/2013  . Carpal tunnel syndrome   . Drug-induced constipation 06/15/2011  . GERD (gastroesophageal reflux disease) 06/15/2011  . Dysplasia of cervix, low grade (CIN 1)   . Pelvic adhesions   . Herpes   . Peripheral neuropathy   . Diabetes mellitus   . Hemochromatosis   .  CANDIDIASIS, ESOPHAGEAL 12/11/2010  . OBSTRUCTIVE SLEEP APNEA 12/07/2010  . HYPERGLYCEMIA 09/25/2010  . NAUSEA 09/08/2010  . ABDOMINAL PAIN, EPIGASTRIC 09/08/2010  . DIARRHEA 06/23/2010  . HEPATOMEGALY 06/23/2010  . 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 PERIPHERAL NEUROPATHY 02/22/2008  . HYPERTENSION 02/22/2008  . GERD 02/22/2008  . FATTY LIVER DISEASE 02/22/2008  . RENAL CYST, LEFT 02/22/2008  . INTERSTITIAL CYSTITIS 02/22/2008  . UTI'S, RECURRENT 02/22/2008  . DEGENERATIVE JOINT DISEASE 02/22/2008  . HERNIATED LUMBOSACRAL DISC 02/22/2008  . [place] DISEASE, CERVICAL 02/22/2008  . FIBROMYALGIA 02/22/2008  . SLEEP APNEA 02/22/2008  . EARLY SATIETY 02/22/2008  . 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 GASTRITIS 01/24/2008  . IRRITABLE BOWEL SYNDROME 01/24/2008  . DEPRESSION 01/19/2008  . GASTROPARESIS 01/19/2008  . MENINGIOMA 05/22/1996   Pura Spice, PT, DPT # [phone] 03/09/2017, 7:27 PM   [facility] [address] [place], [place], [address] Phone: [phone]   Fax:  [phone]  Name: [name] MRN: [id number] Date of Birth: 1946-03-24

## 2017-03-10 ENCOUNTER — Encounter: Payer: Medicare Other | Admitting: Physical Therapy

## 2017-03-11 NOTE — Addendum Note (Signed)
Addended by: Dorcas Carrow on: 03/11/2017 07:14 AM   Modules accepted: Orders

## 2017-03-14 ENCOUNTER — Ambulatory Visit: Payer: Medicare Other | Admitting: Physical Therapy

## 2017-03-14 ENCOUNTER — Encounter: Payer: Self-pay | Admitting: Physical Therapy

## 2017-03-14 DIAGNOSIS — M0579 Rheumatoid arthritis with rheumatoid factor of multiple sites without organ or systems involvement: Secondary | ICD-10-CM | POA: Diagnosis not present

## 2017-03-14 DIAGNOSIS — M2560 Stiffness of unspecified joint, not elsewhere classified: Secondary | ICD-10-CM

## 2017-03-14 DIAGNOSIS — M6281 Muscle weakness (generalized): Secondary | ICD-10-CM

## 2017-03-14 DIAGNOSIS — R262 Difficulty in walking, not elsewhere classified: Secondary | ICD-10-CM

## 2017-03-14 NOTE — Therapy (Signed)
[facility] [address] [place], [place], [address] Phone: [phone]   Fax:  [phone]  Physical Therapy Treatment  Patient Details  Name: [name] MRN: [id number] Date of Birth: Aug 24, 1946 Referring Provider: Maricela Curet, MD  Encounter Date: 03/14/2017      PT End of Session - 03/14/17 1028    Visit Number 7   Number of Visits 16   Date for PT Re-Evaluation 04/11/17   Authorization - Visit Number 1   Authorization - Number of Visits 10   PT Start Time 1026   PT Stop Time 1110   PT Time Calculation (min) 44 min   Equipment Utilized During Treatment Gait belt   Activity Tolerance Treatment limited secondary to medical complications (Comment)   Behavior During Therapy [facility] for tasks assessed/performed      Past Medical History:  Diagnosis Date  . Candida esophagitis ([place])   . Carpal tunnel syndrome   . Diabetes mellitus    type II  . Dysplasia of cervix, low grade (CIN 1)   . Esophageal yeast infection ([place]) 2011  . Family history of malignant neoplasm of gastrointestinal tract   . Fatty liver   . Fibromyalgia   . Fundic gland polyps of stomach, benign   . Gastritis   . GERD (gastroesophageal reflux disease)   . Hemochromatosis   . Herpes   . Hypertension   . Hypothyroidism   . IBS (irritable bowel syndrome)   . Neuropathy   . Pelvic adhesions   . Peripheral neuropathy   . Psoriatic arthritis ([place])   . Sleep apnea    no CPAP used  . Unspecified gastritis and gastroduodenitis without mention of hemorrhage     Past Surgical History:  Procedure Laterality Date  . APPENDECTOMY     dx lap   . BACK SURGERY     L1-L5  . CERVICAL FUSION     c5-c7  . DL LEFT SALPINGECTOMY LYSIS OF ADHESIONS, RIGHT BTL    . FOOT SURGERY    . lap chloecystectomy    . PELVIC LAPAROSCOPY    . TUBAL LIGATION     RIGHT    There were no vitals filed for this visit.      Subjective Assessment - 03/14/17 1027    Subjective Pt reports she is having an MRI on her neck on Friday due to her long history of RUE pain.  She reports that yesterday she could not go to church due to RUE pain after getting ready for church (fixing hair, getting dressed).  Pt reports she has tried her HEP at home but says "I just can't stand like that" when describing her tandem stance exercise.  She is performing her marching and strengthening HEP exercises with questions or concerns.   Limitations Standing;Walking;House hold activities   Patient Stated Goals Increase balance/ LE muscle strength.     Currently in Pain? Yes   Pain Score 5    Pain Location Arm   Pain Orientation Right   Pain Descriptors / Indicators Aching   Pain Type Chronic pain   Pain Onset More than a month ago   Pain Frequency Constant   Multiple Pain Sites Yes   Pain Score 6   Pain Location --  thumb   Pain Orientation Left   Pain Descriptors / Indicators Aching   Pain Type Chronic pain   Pain Onset More than a month ago   Pain Frequency Constant  TREATMENT   LEFS: 24/80  Berg Balance Test: 67/12   Ambulation assessment: Dec trunk rotation and arm swing. More unsteady during R stance phase.    Neuromuscular Re-ed:  Alternating toe taps up to Bosu ball with round side up. Pt unsteady requiring intermittent UE support. One LOB causing pt to lean into railing on L side resulting in small bruise and abrasion on L forearm. Pt provided with bandaid.   Stepping forward with R foot while raising L arm up x10 and repeated on the L foot and R arm x10.   Modified tandem stance x30 seconds each foot forward. (instructed pt to perform this exercise in modified at home as part of HEP rather than a normal tandem stance).   SLS x1 minute each LE with two fingertip hold as pt unable to perform without UE support.   Hallway lateral walking/ braiding (forward), walking backward. All with min guard>min assist. X4 minutes   Ambulating in hallway 2x50 ft  with cues for proper arm swing when advancing each LE. Improved balance with this, requiring min guard assist for safety only.   Standing on airex pad with eyes closed 2x45 seconds with intermittent UE support required. Pt has tendency to lose her balance posteriorly. Cues to lean forward at trunk when she noticed her losing her balance posteriorly, resulting in improved balance. Instructed pt to incorporate this during her daily tasks.       Nustep L5 10 min at end of session Lysle Morales)          [facility] Adult PT Treatment/Exercise - 03/14/17 1035      Balance   Balance Assessed Yes     Standardized Balance Assessment   Standardized Balance Assessment Berg Balance Test     Berg Balance Test   Sit to Stand Able to stand without using hands and stabilize independently   Standing Unsupported Able to stand safely 2 minutes   Sitting with Back Unsupported but Feet Supported on Floor or Stool Able to sit safely and securely 2 minutes   Stand to Sit Sits safely with minimal use of hands   Transfers Able to transfer safely, minor use of hands   Standing Unsupported with Eyes Closed Able to stand 10 seconds with supervision   Standing Ubsupported with Feet Together Able to place feet together independently and stand 1 minute safely   From Standing, Reach Forward with Outstretched Arm Can reach confidently >25 cm (10")   From Standing Position, Pick up Object from Floor Able to pick up shoe, needs supervision   From Standing Position, Turn to Look Behind Over each Shoulder Turn sideways only but maintains balance   Turn 360 Degrees Able to turn 360 degrees safely but slowly   Standing Unsupported, Alternately Place Feet on Step/Stool Needs assistance to keep from falling or unable to try   Standing Unsupported, One Foot in Front Needs help to step but can hold 15 seconds   Standing on One Leg Unable to try or needs assist to prevent fall   Total Score 39                PT  Education - 03/14/17 1033    Education provided Yes   Education Details How to modify balance exercises from HEP.  Exercise technique.  Clinical reasoning behind interventions.  POC moving forward.   Person(s) Educated Patient   Methods Explanation;Demonstration;Verbal cues   Comprehension Verbalized understanding;Returned demonstration;Need further instruction;Verbal cues required  PT Long Term Goals - 04-11-2017 1203      PT LONG TERM GOAL #1   Title Pt. independent with HEP to increase LE muscle strength/endurance to WNL to improve walking/ decrease fall risk.     Baseline Currently not exercising.  Poor endurance/ requires seated rest breaks.    Time 4   Period Weeks   Status On-going     PT LONG TERM GOAL #2   Title Pt. will increase LEFS to >40 out of 80 to improve pain-free mobility/ gait.     Baseline LEFS: 13 out of 80; 24/80 on Apr 12, 2023   Time 4   Period Weeks   Status On-going     PT LONG TERM GOAL #3   Title Pt. will increase Berg balance test to >50/56 to decrease fall risk/ improve gait.     Baseline Berg 44/56; Berg 39/56 on 04-12-23   Time 4   Period Weeks   Status On-going     PT LONG TERM GOAL #4   Title Pt. able to ambulate with more normalized gait pattern and appropriate assistive device to improve community mobility/ shopping.    Baseline Short stridelength with decrease heel strike. 04/11/2017: see note   Time 4   Period Weeks   Status On-going     PT LONG TERM GOAL #5   Title Pt. will progress to Silver Sneakers/ independent gym based ex. program with no limitations safely.     Baseline Pt. will call to check on Silver Sneakers status.    Time 4   Period Weeks   Status On-going               Plan - 2017/04/11 1200    Clinical Impression Statement Pt demonstrated improvement on LEFS suggesting improved functional use and strength in BLEs with functional tasks.  She is performing her HEP at home and was educated on modified technique to  perform tandem stance for improved tolerance at home. She does, however, continue to demonstrate balance impairments as evidenced by her Berg Balance Score and assist required during session.  Pt will benefit from continued skilled PT interventions for continued improvements in balance, strength, and decreased fall risk.   Rehab Potential Fair   PT Frequency 2x / week   PT Duration 4 weeks   PT Treatment/Interventions ADLs/Self Care Home Management;Aquatic Therapy;Cryotherapy;Moist Heat;Gait training;Stair training;Functional mobility training;Therapeutic activities;Therapeutic exercise;Balance training;Patient/family education;Neuromuscular re-education;Manual techniques;Passive range of motion   PT Next Visit Plan Progress LE strengthening/ balance and gait   PT Home Exercise Plan emailed HEP to pt.     Consulted and Agree with Plan of Care Patient      Patient will benefit from skilled therapeutic intervention in order to improve the following deficits and impairments:  Abnormal gait, Pain, Decreased coordination, Decreased mobility, Postural dysfunction, Decreased strength, Decreased range of motion, Hypomobility, Decreased endurance, Decreased activity tolerance, Decreased balance, Decreased safety awareness, Difficulty walking, Impaired flexibility  Visit Diagnosis: Rheumatoid arthritis involving multiple sites with positive rheumatoid factor (HCC)  Muscle weakness  Joint stiffness  Difficulty in walking, not elsewhere classified       G-Codes - 04/11/2017 1204    Functional Assessment Tool Used (Outpatient Only) Berg Balance Test, LEFS, clinical judgement, gait mechanics   Functional Limitation Mobility: Walking and moving around   Mobility: Walking and Moving Around Current Status (V7846) At least 40 percent but less than 60 percent impaired, limited or restricted   Mobility: Walking and Moving Around Goal Status (N6295) At  least 1 percent but less than 20 percent impaired, limited  or restricted      Problem List Patient Active Problem List   Diagnosis Date Noted  . Vaginal atrophy 05/14/2013  . Carpal tunnel syndrome   . Drug-induced constipation 06/15/2011  . GERD (gastroesophageal reflux disease) 06/15/2011  . Dysplasia of cervix, low grade (CIN 1)   . Pelvic adhesions   . Herpes   . Peripheral neuropathy   . Diabetes mellitus   . Hemochromatosis   . CANDIDIASIS, ESOPHAGEAL 12/11/2010  . OBSTRUCTIVE SLEEP APNEA 12/07/2010  . HYPERGLYCEMIA 09/25/2010  . NAUSEA 09/08/2010  . ABDOMINAL PAIN, EPIGASTRIC 09/08/2010  . DIARRHEA 06/23/2010  . HEPATOMEGALY 06/23/2010  . 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 PERIPHERAL NEUROPATHY 02/22/2008  . HYPERTENSION 02/22/2008  . GERD 02/22/2008  . FATTY LIVER DISEASE 02/22/2008  . RENAL CYST, LEFT 02/22/2008  . INTERSTITIAL CYSTITIS 02/22/2008  . UTI'S, RECURRENT 02/22/2008  . DEGENERATIVE JOINT DISEASE 02/22/2008  . HERNIATED LUMBOSACRAL DISC 02/22/2008  . [place] DISEASE, CERVICAL 02/22/2008  . FIBROMYALGIA 02/22/2008  . SLEEP APNEA 02/22/2008  . EARLY SATIETY 02/22/2008  . 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 GASTRITIS 01/24/2008  . IRRITABLE BOWEL SYNDROME 01/24/2008  . DEPRESSION 01/19/2008  . GASTROPARESIS 01/19/2008  . MENINGIOMA 05/22/1996    Collie Siad PT, DPT 03/14/2017, 12:06 PM  [facility] [address] [place], [place], [address] Phone: [phone]   Fax:  [phone]  Name: [name] MRN: [id number] Date of Birth: [birth year]

## 2017-03-16 ENCOUNTER — Ambulatory Visit: Payer: Medicare Other | Admitting: Physical Therapy

## 2017-03-16 ENCOUNTER — Encounter: Payer: Self-pay | Admitting: Physical Therapy

## 2017-03-16 DIAGNOSIS — M2560 Stiffness of unspecified joint, not elsewhere classified: Secondary | ICD-10-CM

## 2017-03-16 DIAGNOSIS — M0579 Rheumatoid arthritis with rheumatoid factor of multiple sites without organ or systems involvement: Secondary | ICD-10-CM | POA: Diagnosis not present

## 2017-03-16 DIAGNOSIS — M6281 Muscle weakness (generalized): Secondary | ICD-10-CM

## 2017-03-16 DIAGNOSIS — R262 Difficulty in walking, not elsewhere classified: Secondary | ICD-10-CM

## 2017-03-16 NOTE — Therapy (Signed)
[facility] [address] [place], [place], [address] Phone: [phone]   Fax:  [phone]  Physical Therapy Treatment  Patient Details  Name: [name] MRN: [id number] Date of Birth: [birth year] Referring Provider: Maricela Curet, MD  Encounter Date: 03/16/2017      PT End of Session - 03/16/17 1037    Visit Number 8   Number of Visits 16   Date for PT Re-Evaluation 04/11/17   Authorization - Visit Number 2   Authorization - Number of Visits 10   PT Start Time 1031   PT Stop Time 1112   PT Time Calculation (min) 41 min   Equipment Utilized During Treatment Gait belt   Activity Tolerance Treatment limited secondary to medical complications (Comment)   Behavior During Therapy [facility] for tasks assessed/performed      Past Medical History:  Diagnosis Date  . Candida esophagitis ([place])   . Carpal tunnel syndrome   . Diabetes mellitus    type II  . Dysplasia of cervix, low grade (CIN 1)   . Esophageal yeast infection ([place]) 2011  . Family history of malignant neoplasm of gastrointestinal tract   . Fatty liver   . Fibromyalgia   . Fundic gland polyps of stomach, benign   . Gastritis   . GERD (gastroesophageal reflux disease)   . Hemochromatosis   . Herpes   . Hypertension   . Hypothyroidism   . IBS (irritable bowel syndrome)   . Neuropathy   . Pelvic adhesions   . Peripheral neuropathy   . Psoriatic arthritis ([place])   . Sleep apnea    no CPAP used  . Unspecified gastritis and gastroduodenitis without mention of hemorrhage     Past Surgical History:  Procedure Laterality Date  . APPENDECTOMY     dx lap   . BACK SURGERY     L1-L5  . CERVICAL FUSION     c5-c7  . DL LEFT SALPINGECTOMY LYSIS OF ADHESIONS, RIGHT BTL    . FOOT SURGERY    . lap chloecystectomy    . PELVIC LAPAROSCOPY    . TUBAL LIGATION     RIGHT    There were no vitals filed for this visit.      Subjective Assessment - 03/16/17 1034    Subjective Pt reports her psoriatic arthritis began flaring up yesterday which resulted her in not getting OOB yesterday.  Today is slightly better but she is having generalized pain. Her abrasion on her L forearm is healing well and pt has it covered with bandaid.  Pt reports she has noticed an improvement in her balance in her home setting.   Limitations Standing;Walking;House hold activities   Patient Stated Goals Increase balance/ LE muscle strength.     Currently in Pain? Yes   Pain Score 8    Pain Location --  generalized whole body pain   Pain Descriptors / Indicators Aching   Multiple Pain Sites No   Pain Onset More than a month ago       TREATMENT   Therapeutic Exercise:  Forward walking in // bars practicing arm swing to sync up with stepping of opposite LE. x4 minutes.   Hallway lateral walking, walking backward. All with min guard. X4 minutes   Seated Bil hip ER/Abd 2x15 with 2 second holds with GTB    Neuromuscular Re-ed:  Alternating toe taps up to Bosu ball with round side up. Pt unsteady requiring intermittent UE support. 2x20 with  each LE   Obstacle course in gym with pt stepping up and over airex pad, forward stepping over cones, weaving around cones, sit<>stand (powering up to stand), walking around dynadisc. x12 minutes   Standing on airex pad with eyes closed 2x45 seconds with intermittent UE support required. Pt has tendency to lose her balance posteriorly. Cues to lean forward at trunk when she noticed her losing her balance posteriorly, resulting in improved balance. Instructed pt to incorporate this during her daily tasks.          PT Education - 03/16/17 1037    Education provided Yes   Education Details Exercise technique   Person(s) Educated Patient   Methods Explanation;Demonstration;Verbal cues   Comprehension Verbalized understanding;Returned demonstration;Verbal cues required;Need further instruction             PT Long Term Goals -  03/14/17 1203      PT LONG TERM GOAL #1   Title Pt. independent with HEP to increase LE muscle strength/endurance to WNL to improve walking/ decrease fall risk.     Baseline Currently not exercising.  Poor endurance/ requires seated rest breaks.    Time 4   Period Weeks   Status On-going     PT LONG TERM GOAL #2   Title Pt. will increase LEFS to >40 out of 80 to improve pain-free mobility/ gait.     Baseline LEFS: 13 out of 80; 24/80 on 6/11   Time 4   Period Weeks   Status On-going     PT LONG TERM GOAL #3   Title Pt. will increase Berg balance test to >50/56 to decrease fall risk/ improve gait.     Baseline Berg 44/56; Berg 39/56 on 6/11   Time 4   Period Weeks   Status On-going     PT LONG TERM GOAL #4   Title Pt. able to ambulate with more normalized gait pattern and appropriate assistive device to improve community mobility/ shopping.    Baseline Short stridelength with decrease heel strike. 03/14/17: see note   Time 4   Period Weeks   Status On-going     PT LONG TERM GOAL #5   Title Pt. will progress to Silver Sneakers/ independent gym based ex. program with no limitations safely.     Baseline Pt. will call to check on Silver Sneakers status.    Time 4   Period Weeks   Status On-going               Plan - 03/16/17 1104    Clinical Impression Statement Pt tolerated all interventions well this date and denied any increase in pain throughout session.  She required only min guard assist for backward and lateral walking as opposed to occasional min assist at prior sessions.  She demonstrates instability with SLS activities.  Pt will benefit from continued skilled PT interventions for improved balance and to decrease pt's fall risk.   Rehab Potential Fair   PT Frequency 2x / week   PT Duration 4 weeks   PT Treatment/Interventions ADLs/Self Care Home Management;Aquatic Therapy;Cryotherapy;Moist Heat;Gait training;Stair training;Functional mobility training;Therapeutic  activities;Therapeutic exercise;Balance training;Patient/family education;Neuromuscular re-education;Manual techniques;Passive range of motion   PT Next Visit Plan Progress LE strengthening/ balance and gait   PT Home Exercise Plan emailed HEP to pt.     Consulted and Agree with Plan of Care Patient      Patient will benefit from skilled therapeutic intervention in order to improve the following deficits and impairments:  Abnormal gait, Pain, Decreased coordination, Decreased mobility, Postural dysfunction, Decreased strength, Decreased range of motion, Hypomobility, Decreased endurance, Decreased activity tolerance, Decreased balance, Decreased safety awareness, Difficulty walking, Impaired flexibility  Visit Diagnosis: Rheumatoid arthritis involving multiple sites with positive rheumatoid factor (HCC)  Muscle weakness  Joint stiffness  Difficulty in walking, not elsewhere classified     Problem List Patient Active Problem List   Diagnosis Date Noted  . Vaginal atrophy 05/14/2013  . Carpal tunnel syndrome   . Drug-induced constipation 06/15/2011  . GERD (gastroesophageal reflux disease) 06/15/2011  . Dysplasia of cervix, low grade (CIN 1)   . Pelvic adhesions   . Herpes   . Peripheral neuropathy   . Diabetes mellitus   . Hemochromatosis   . CANDIDIASIS, ESOPHAGEAL 12/11/2010  . OBSTRUCTIVE SLEEP APNEA 12/07/2010  . HYPERGLYCEMIA 09/25/2010  . NAUSEA 09/08/2010  . ABDOMINAL PAIN, EPIGASTRIC 09/08/2010  . DIARRHEA 06/23/2010  . HEPATOMEGALY 06/23/2010  . 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 PERIPHERAL NEUROPATHY 02/22/2008  . HYPERTENSION 02/22/2008  . GERD 02/22/2008  . FATTY LIVER DISEASE 02/22/2008  . RENAL CYST, LEFT 02/22/2008  . INTERSTITIAL CYSTITIS 02/22/2008  . UTI'S, RECURRENT 02/22/2008  . DEGENERATIVE JOINT DISEASE 02/22/2008  . HERNIATED LUMBOSACRAL DISC 02/22/2008  . [place] DISEASE, CERVICAL 02/22/2008  . FIBROMYALGIA 02/22/2008  . SLEEP APNEA 02/22/2008  .  EARLY SATIETY 02/22/2008  . 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 GASTRITIS 01/24/2008  . IRRITABLE BOWEL SYNDROME 01/24/2008  . DEPRESSION 01/19/2008  . GASTROPARESIS 01/19/2008  . MENINGIOMA 05/22/1996    Collie Siad PT, DPT 03/16/2017, 11:13 AM  [facility] [address] [place], [place], [address] Phone: [phone]   Fax:  [phone]  Name: [name] MRN: [id number] Date of Birth: April 06, 1946

## 2017-03-22 ENCOUNTER — Encounter: Payer: Medicare Other | Admitting: Physical Therapy

## 2017-03-24 ENCOUNTER — Encounter: Payer: Medicare Other | Admitting: Physical Therapy

## 2017-03-29 ENCOUNTER — Ambulatory Visit: Payer: Medicare Other | Admitting: Physical Therapy

## 2017-03-29 DIAGNOSIS — R262 Difficulty in walking, not elsewhere classified: Secondary | ICD-10-CM

## 2017-03-29 DIAGNOSIS — M0579 Rheumatoid arthritis with rheumatoid factor of multiple sites without organ or systems involvement: Secondary | ICD-10-CM | POA: Diagnosis not present

## 2017-03-29 DIAGNOSIS — M6281 Muscle weakness (generalized): Secondary | ICD-10-CM

## 2017-03-29 DIAGNOSIS — M2560 Stiffness of unspecified joint, not elsewhere classified: Secondary | ICD-10-CM

## 2017-03-29 NOTE — Therapy (Signed)
[facility] [address]. [place], [place], [address] Phone: [phone]   Fax:  [phone]  Physical Therapy Treatment  Patient Details  Name: [name] MRN: [id number] Date of Birth: [birth year] Referring Provider: Maricela Curet, MD  Encounter Date: 03/29/2017      PT End of Session - 03/29/17 1350    Visit Number 9   Number of Visits 16   Date for PT Re-Evaluation 04/11/17   Authorization - Visit Number 3   Authorization - Number of Visits 10   PT Start Time 4431   PT Stop Time 1435   PT Time Calculation (min) 52 min   Equipment Utilized During Treatment Gait belt   Activity Tolerance Treatment limited secondary to medical complications (Comment)   Behavior During Therapy [facility] for tasks assessed/performed      Past Medical History:  Diagnosis Date  . Candida esophagitis ([place])   . Carpal tunnel syndrome   . Diabetes mellitus    type II  . Dysplasia of cervix, low grade (CIN 1)   . Esophageal yeast infection ([place]) 2011  . Family history of malignant neoplasm of gastrointestinal tract   . Fatty liver   . Fibromyalgia   . Fundic gland polyps of stomach, benign   . Gastritis   . GERD (gastroesophageal reflux disease)   . Hemochromatosis   . Herpes   . Hypertension   . Hypothyroidism   . IBS (irritable bowel syndrome)   . Neuropathy   . Pelvic adhesions   . Peripheral neuropathy   . Psoriatic arthritis ([place])   . Sleep apnea    no CPAP used  . Unspecified gastritis and gastroduodenitis without mention of hemorrhage     Past Surgical History:  Procedure Laterality Date  . APPENDECTOMY     dx lap   . BACK SURGERY     L1-L5  . CERVICAL FUSION     c5-c7  . DL LEFT SALPINGECTOMY LYSIS OF ADHESIONS, RIGHT BTL    . FOOT SURGERY    . lap chloecystectomy    . PELVIC LAPAROSCOPY    . TUBAL LIGATION     RIGHT    There were no vitals filed for this visit.      Subjective Assessment - 03/29/17 1345     Subjective Pt. reports MRI revealed a mini-stroke (no change in POC or medications).  No problems in the cervical spine MRI revealed.  Pt. states B shoulder pain remains persistent.  Pt. has had no medication for arthritis since May (waiting on paperwork/ financial assistance).     Limitations Standing;Walking;House hold activities   Patient Stated Goals Increase balance/ LE muscle strength.     Currently in Pain? Yes   Pain Score 8    Pain Location Other (Comment)  generalized body pain      TREATMENT   Therapeutic Exercise:   Nustep L7 10 min.  B UE/LE- instructed to rest R shoulder during Nustep if pain.    Forward walking in // bars practicing arm swing to sync up with stepping of opposite LE. x4 minutes.   Hallway lateral walking, walking backward. All with min guard. X4 minutes  Step ups/down with min. To no UE assist.   Standing hip/ knee ex. Program in //-bars (no ankle wts.).       Neuromuscular Re-ed:   Gait in hallway (no assistive device) working on increase BOS/ step pattern/ heel strike (2 episodes of scissoring L LE across R.  Pt. Able to self-correct.     Sit to stand from blue mat table on Airex 5x.  Added functional reaching with cones.    Alternating toe taps with cones. Pt unsteady requiring intermittent UE support in //-bars. 2x20 with each LE   Hallway balance tasks: alt. UE and LE touches/ head turns/ posture correction.           PT Long Term Goals - 03/14/17 1203      PT LONG TERM GOAL #1   Title Pt. independent with HEP to increase LE muscle strength/endurance to WNL to improve walking/ decrease fall risk.     Baseline Currently not exercising.  Poor endurance/ requires seated rest breaks.    Time 4   Period Weeks   Status On-going     PT LONG TERM GOAL #2   Title Pt. will increase LEFS to >40 out of 80 to improve pain-free mobility/ gait.     Baseline LEFS: 13 out of 80; 24/80 on 6/11   Time 4   Period Weeks   Status On-going      PT LONG TERM GOAL #3   Title Pt. will increase Berg balance test to >50/56 to decrease fall risk/ improve gait.     Baseline Berg 44/56; Berg 39/56 on 6/11   Time 4   Period Weeks   Status On-going     PT LONG TERM GOAL #4   Title Pt. able to ambulate with more normalized gait pattern and appropriate assistive device to improve community mobility/ shopping.    Baseline Short stridelength with decrease heel strike. 03/14/17: see note   Time 4   Period Weeks   Status On-going     PT LONG TERM GOAL #5   Title Pt. will progress to Silver Sneakers/ independent gym based ex. program with no limitations safely.     Baseline Pt. will call to check on Silver Sneakers status.    Time 4   Period Weeks   Status On-going           Plan - 03/30/17 2015    Clinical Impression Statement Pt. ambulates with improved gait pattern with no assistive device in hallway.  Pt. will ocassionally scissor L LE over R with walking in hallway when distracted or focusing on BOS/ posture.  Good functional reaching with R UE.     Clinical Presentation Stable   Clinical Decision Making Moderate   Rehab Potential Fair   PT Frequency 2x / week   PT Duration 4 weeks   PT Treatment/Interventions ADLs/Self Care Home Management;Aquatic Therapy;Cryotherapy;Moist Heat;Gait training;Stair training;Functional mobility training;Therapeutic activities;Therapeutic exercise;Balance training;Patient/family education;Neuromuscular re-education;Manual techniques;Passive range of motion   PT Next Visit Plan Progress LE strengthening/ balance and gait   PT Home Exercise Plan emailed HEP to pt.     Consulted and Agree with Plan of Care Patient      Patient will benefit from skilled therapeutic intervention in order to improve the following deficits and impairments:  Abnormal gait, Pain, Decreased coordination, Decreased mobility, Postural dysfunction, Decreased strength, Decreased range of motion, Hypomobility, Decreased  endurance, Decreased activity tolerance, Decreased balance, Decreased safety awareness, Difficulty walking, Impaired flexibility  Visit Diagnosis: Rheumatoid arthritis involving multiple sites with positive rheumatoid factor (HCC)  Muscle weakness  Joint stiffness  Difficulty in walking, not elsewhere classified     Problem List Patient Active Problem List   Diagnosis Date Noted  . Vaginal atrophy 05/14/2013  . Carpal tunnel syndrome   . Drug-induced constipation 06/15/2011  .  GERD (gastroesophageal reflux disease) 06/15/2011  . Dysplasia of cervix, low grade (CIN 1)   . Pelvic adhesions   . Herpes   . Peripheral neuropathy   . Diabetes mellitus   . Hemochromatosis   . CANDIDIASIS, ESOPHAGEAL 12/11/2010  . OBSTRUCTIVE SLEEP APNEA 12/07/2010  . HYPERGLYCEMIA 09/25/2010  . NAUSEA 09/08/2010  . ABDOMINAL PAIN, EPIGASTRIC 09/08/2010  . DIARRHEA 06/23/2010  . HEPATOMEGALY 06/23/2010  . 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 PERIPHERAL NEUROPATHY 02/22/2008  . HYPERTENSION 02/22/2008  . GERD 02/22/2008  . FATTY LIVER DISEASE 02/22/2008  . RENAL CYST, LEFT 02/22/2008  . INTERSTITIAL CYSTITIS 02/22/2008  . UTI'S, RECURRENT 02/22/2008  . DEGENERATIVE JOINT DISEASE 02/22/2008  . HERNIATED LUMBOSACRAL DISC 02/22/2008  . [place] DISEASE, CERVICAL 02/22/2008  . FIBROMYALGIA 02/22/2008  . SLEEP APNEA 02/22/2008  . EARLY SATIETY 02/22/2008  . 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 GASTRITIS 01/24/2008  . IRRITABLE BOWEL SYNDROME 01/24/2008  . DEPRESSION 01/19/2008  . GASTROPARESIS 01/19/2008  . MENINGIOMA 05/22/1996   Pura Spice, PT, DPT # [phone] 03/30/2017, 8:21 PM  [facility] [address] [place], [place], [address] Phone: [phone]   Fax:  [phone]  Name: MARITZA HOSTERMAN MRN: [id number] Date of Birth: 22-Jan-1946

## 2017-03-31 ENCOUNTER — Encounter: Payer: Self-pay | Admitting: Physical Therapy

## 2017-03-31 ENCOUNTER — Ambulatory Visit: Payer: Medicare Other | Admitting: Physical Therapy

## 2017-03-31 DIAGNOSIS — R262 Difficulty in walking, not elsewhere classified: Secondary | ICD-10-CM

## 2017-03-31 DIAGNOSIS — M0579 Rheumatoid arthritis with rheumatoid factor of multiple sites without organ or systems involvement: Secondary | ICD-10-CM

## 2017-03-31 DIAGNOSIS — M2560 Stiffness of unspecified joint, not elsewhere classified: Secondary | ICD-10-CM

## 2017-03-31 DIAGNOSIS — M6281 Muscle weakness (generalized): Secondary | ICD-10-CM

## 2017-03-31 NOTE — Therapy (Signed)
[facility] [address] [place], [place], [address] Phone: [phone]   Fax:  [phone]  Physical Therapy Treatment  Patient Details  Name: [name] MRN: [id number] Date of Birth: [birth year] Referring Provider: Maricela Curet, MD  Encounter Date: 03/31/2017      PT End of Session - 03/31/17 1401    Visit Number 10   Number of Visits 16   Date for PT Re-Evaluation 04/11/17   Authorization - Visit Number 4   Authorization - Number of Visits 10   PT Start Time 5573   PT Stop Time 1442   PT Time Calculation (min) 49 min   Equipment Utilized During Treatment Gait belt   Activity Tolerance Patient tolerated treatment well   Behavior During Therapy [facility] for tasks assessed/performed      Past Medical History:  Diagnosis Date  . Candida esophagitis ([place])   . Carpal tunnel syndrome   . Diabetes mellitus    type II  . Dysplasia of cervix, low grade (CIN 1)   . Esophageal yeast infection ([place]) 2011  . Family history of malignant neoplasm of gastrointestinal tract   . Fatty liver   . Fibromyalgia   . Fundic gland polyps of stomach, benign   . Gastritis   . GERD (gastroesophageal reflux disease)   . Hemochromatosis   . Herpes   . Hypertension   . Hypothyroidism   . IBS (irritable bowel syndrome)   . Neuropathy   . Pelvic adhesions   . Peripheral neuropathy   . Psoriatic arthritis ([place])   . Sleep apnea    no CPAP used  . Unspecified gastritis and gastroduodenitis without mention of hemorrhage     Past Surgical History:  Procedure Laterality Date  . APPENDECTOMY     dx lap   . BACK SURGERY     L1-L5  . CERVICAL FUSION     c5-c7  . DL LEFT SALPINGECTOMY LYSIS OF ADHESIONS, RIGHT BTL    . FOOT SURGERY    . lap chloecystectomy    . PELVIC LAPAROSCOPY    . TUBAL LIGATION     RIGHT    There were no vitals filed for this visit.      Subjective Assessment - 03/31/17 1403    Subjective Pt. had f/u with  family MD (Dr. Philipp Ovens).  Pt. states MD really encourged pt. to use RW due to balance issues/ fall risk.  PT explained with Berg balance test again to reenforce use of an assistive device.  Pt. prescribed shower chair/ face mask for C-Pap.  Pt. reports less pain today 5/10 (generalized pain).   Limitations Standing;Walking;House hold activities   Patient Stated Goals Increase balance/ LE muscle strength.     Currently in Pain? Yes   Pain Score 5    Pain Location Other (Comment)   Pain Descriptors / Indicators Aching   Pain Type Chronic pain      TREATMENT   Therapeutic Exercise:   Nustep L7 10 min.  B UE/LE- instructed to rest R shoulder during Nustep if pain.    Step ups/down with min. To no UE assist.   Standing hip/ knee ex. Program in //-bars (no ankle wts.).        Neuromuscular Re-ed:   Airex balance beam in //-bars (B UE progressing to 1 UE assist).    Gait in hallway (no assistive device) working on increase BOS/ step pattern/ heel strike (no scissoring but limited hip flexion/ heel  strike due to improper shoes).     Sit to stand from blue mat table on Airex 5x.  Added functional reaching with cones.    Alternating 6" stair taps with no UE assist 20x.   Resisted gait pattern 1BTB (frequent posture correction)- no UE assist.             PT Long Term Goals - 03/14/17 1203      PT LONG TERM GOAL #1   Title Pt. independent with HEP to increase LE muscle strength/endurance to WNL to improve walking/ decrease fall risk.     Baseline Currently not exercising.  Poor endurance/ requires seated rest breaks.    Time 4   Period Weeks   Status On-going     PT LONG TERM GOAL #2   Title Pt. will increase LEFS to >40 out of 80 to improve pain-free mobility/ gait.     Baseline LEFS: 13 out of 80; 24/80 on 6/11   Time 4   Period Weeks   Status On-going     PT LONG TERM GOAL #3   Title Pt. will increase Berg balance test to >50/56 to decrease fall risk/  improve gait.     Baseline Berg 44/56; Berg 39/56 on 6/11   Time 4   Period Weeks   Status On-going     PT LONG TERM GOAL #4   Title Pt. able to ambulate with more normalized gait pattern and appropriate assistive device to improve community mobility/ shopping.    Baseline Short stridelength with decrease heel strike. 03/14/17: see note   Time 4   Period Weeks   Status On-going     PT LONG TERM GOAL #5   Title Pt. will progress to Silver Sneakers/ independent gym based ex. program with no limitations safely.     Baseline Pt. will call to check on Silver Sneakers status.    Time 4   Period Weeks   Status On-going               Plan - 03/31/17 1355    Clinical Impression Statement PT discussed benefits of aquatic ex. program at [employer].  Pt. also educated in proper shoewear/ pt. prefers slide on shoes with no heel support.  Decrease step pattern/ heel strike with use of slide on shoes as compared to sneakers.  No balance issues with use of RW in gym and pt. planning on being more compliant with use at this time.  Pt. has 2 more PT appts. scheduled and is hoping to join [employer] for aquatic ex. with sister.     Clinical Presentation Stable   Clinical Decision Making Moderate   Rehab Potential Fair   PT Frequency 2x / week   PT Duration 4 weeks   PT Treatment/Interventions ADLs/Self Care Home Management;Aquatic Therapy;Cryotherapy;Moist Heat;Gait training;Stair training;Functional mobility training;Therapeutic activities;Therapeutic exercise;Balance training;Patient/family education;Neuromuscular re-education;Manual techniques;Passive range of motion   PT Next Visit Plan Progress LE strengthening/ balance and gait   PT Home Exercise Plan emailed HEP to pt.     Consulted and Agree with Plan of Care Patient      Patient will benefit from skilled therapeutic intervention in order to improve the following deficits and impairments:  Abnormal gait, Pain, Decreased  coordination, Decreased mobility, Postural dysfunction, Decreased strength, Decreased range of motion, Hypomobility, Decreased endurance, Decreased activity tolerance, Decreased balance, Decreased safety awareness, Difficulty walking, Impaired flexibility  Visit Diagnosis: Rheumatoid arthritis involving multiple sites with positive rheumatoid factor (HCC)  Muscle  weakness  Joint stiffness  Difficulty in walking, not elsewhere classified     Problem List Patient Active Problem List   Diagnosis Date Noted  . Vaginal atrophy 05/14/2013  . Carpal tunnel syndrome   . Drug-induced constipation 06/15/2011  . GERD (gastroesophageal reflux disease) 06/15/2011  . Dysplasia of cervix, low grade (CIN 1)   . Pelvic adhesions   . Herpes   . Peripheral neuropathy   . Diabetes mellitus   . Hemochromatosis   . CANDIDIASIS, ESOPHAGEAL 12/11/2010  . OBSTRUCTIVE SLEEP APNEA 12/07/2010  . HYPERGLYCEMIA 09/25/2010  . NAUSEA 09/08/2010  . ABDOMINAL PAIN, EPIGASTRIC 09/08/2010  . DIARRHEA 06/23/2010  . HEPATOMEGALY 06/23/2010  . 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 PERIPHERAL NEUROPATHY 02/22/2008  . HYPERTENSION 02/22/2008  . GERD 02/22/2008  . FATTY LIVER DISEASE 02/22/2008  . RENAL CYST, LEFT 02/22/2008  . INTERSTITIAL CYSTITIS 02/22/2008  . UTI'S, RECURRENT 02/22/2008  . DEGENERATIVE JOINT DISEASE 02/22/2008  . HERNIATED LUMBOSACRAL DISC 02/22/2008  . [place] DISEASE, CERVICAL 02/22/2008  . FIBROMYALGIA 02/22/2008  . SLEEP APNEA 02/22/2008  . EARLY SATIETY 02/22/2008  . 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 GASTRITIS 01/24/2008  . IRRITABLE BOWEL SYNDROME 01/24/2008  . DEPRESSION 01/19/2008  . GASTROPARESIS 01/19/2008  . MENINGIOMA 05/22/1996   Pura Spice, PT, DPT # [phone] 03/31/2017, 2:49 PM  [facility] [address] [place], [place], [address] Phone: [phone]   Fax:  [phone]  Name: LEKESHA CLAW MRN: [id number] Date of Birth:  Feb 03, 1946

## 2017-04-05 ENCOUNTER — Encounter: Payer: Medicare Other | Admitting: Physical Therapy

## 2017-04-11 ENCOUNTER — Ambulatory Visit: Payer: Medicare Other | Attending: Urology | Admitting: Physical Therapy

## 2017-04-11 ENCOUNTER — Encounter: Payer: Self-pay | Admitting: Physical Therapy

## 2017-04-11 DIAGNOSIS — M6281 Muscle weakness (generalized): Secondary | ICD-10-CM

## 2017-04-11 DIAGNOSIS — M2560 Stiffness of unspecified joint, not elsewhere classified: Secondary | ICD-10-CM | POA: Insufficient documentation

## 2017-04-11 DIAGNOSIS — R262 Difficulty in walking, not elsewhere classified: Secondary | ICD-10-CM | POA: Insufficient documentation

## 2017-04-11 DIAGNOSIS — M0579 Rheumatoid arthritis with rheumatoid factor of multiple sites without organ or systems involvement: Secondary | ICD-10-CM | POA: Diagnosis present

## 2017-04-11 NOTE — Therapy (Signed)
[facility] [address] [place], [place], [address] Phone: [phone]   Fax:  [phone]  Physical Therapy Treatment  Patient Details  Name: [name] MRN: [id number] Date of Birth: June 19, 1946 Referring Provider: Maricela Curet, MD  Encounter Date: 04/11/2017      PT End of Session - 04/11/17 1516    Visit Number 11   Number of Visits 16   Date for PT Re-Evaluation 04/11/17   Authorization - Visit Number 5   Authorization - Number of Visits 10   PT Start Time 7026   PT Stop Time 1529   PT Time Calculation (min) 54 min   Equipment Utilized During Treatment Gait belt   Activity Tolerance Patient tolerated treatment well   Behavior During Therapy [facility] for tasks assessed/performed      Past Medical History:  Diagnosis Date  . Candida esophagitis ([place])   . Carpal tunnel syndrome   . Diabetes mellitus    type II  . Dysplasia of cervix, low grade (CIN 1)   . Esophageal yeast infection ([place]) 2011  . Family history of malignant neoplasm of gastrointestinal tract   . Fatty liver   . Fibromyalgia   . Fundic gland polyps of stomach, benign   . Gastritis   . GERD (gastroesophageal reflux disease)   . Hemochromatosis   . Herpes   . Hypertension   . Hypothyroidism   . IBS (irritable bowel syndrome)   . Neuropathy   . Pelvic adhesions   . Peripheral neuropathy   . Psoriatic arthritis ([place])   . Sleep apnea    no CPAP used  . Unspecified gastritis and gastroduodenitis without mention of hemorrhage     Past Surgical History:  Procedure Laterality Date  . APPENDECTOMY     dx lap   . BACK SURGERY     L1-L5  . CERVICAL FUSION     c5-c7  . DL LEFT SALPINGECTOMY LYSIS OF ADHESIONS, RIGHT BTL    . FOOT SURGERY    . lap chloecystectomy    . PELVIC LAPAROSCOPY    . TUBAL LIGATION     RIGHT    There were no vitals filed for this visit.      Subjective Assessment - 04/11/17 1432    Subjective Pt. states 3 falls  since last PT appt.  Pt. states she fell in shower due to grab bar coming off the wall.  Pt. also fell slipping on bath mat in bathroom.  Pt. states she fell between bed/ nightstand and required extra time to get up on her own.  Pt. reports no injuries besides brusing on R shoulder/ buttocks/ foot.     Limitations Standing;Walking;House hold activities   Patient Stated Goals Increase balance/ LE muscle strength.     Currently in Pain? Yes   Pain Score 3    Pain Location Other (Comment)  generalized joint pain (ecchymosis in R shoulder/ glut/ R foot)   Pain Orientation Right   Pain Descriptors / Indicators Aching;Tender   Pain Type Chronic pain   Pain Onset More than a month ago        TREATMENT   Therapeutic Exercise:   Nustep L7 10 min. B LE only (consistent cadence)- no increase c/o pain.    Seated/standing hip/ knee ex. Program in //-bars (3# ankle wts.).   Strength testing: B hip flexion 4/5 MMT, quad 4-/5 MMT, knee flexion/ hip abd. 4/5 MMT.    Reviewed HEP in depth.  Neuromuscular Re-ed:   Berg balance test: 46/56  Airex balance beam in //-bars (B UE progressing to 1 UE assist)- forward/ lateral.    Gait in hallway (no assistive device) working on increase BOS/ step pattern/ heel strike (no scissoring but limited hip flexion/ heel strike due to improper shoes).    Alternating 6" stair taps with no UE assist 20x.  Resisted gait pattern 1BTB (frequent posture correction)- no UE assist.            PT Long Term Goals - 03/14/17 1203      PT LONG TERM GOAL #1   Title Pt. independent with HEP to increase LE muscle strength/endurance to WNL to improve walking/ decrease fall risk.     Baseline Currently not exercising.  Poor endurance/ requires seated rest breaks.    Time 4   Period Weeks   Status On-going     PT LONG TERM GOAL #2   Title Pt. will increase LEFS to >40 out of 80 to improve pain-free mobility/ gait.     Baseline LEFS: 13 out  of 80; 24/80 on 6/11   Time 4   Period Weeks   Status On-going     PT LONG TERM GOAL #3   Title Pt. will increase Berg balance test to >50/56 to decrease fall risk/ improve gait.     Baseline Berg 44/56; Berg 39/56 on 6/11   Time 4   Period Weeks   Status On-going     PT LONG TERM GOAL #4   Title Pt. able to ambulate with more normalized gait pattern and appropriate assistive device to improve community mobility/ shopping.    Baseline Short stridelength with decrease heel strike. 03/14/17: see note   Time 4   Period Weeks   Status On-going     PT LONG TERM GOAL #5   Title Pt. will progress to Silver Sneakers/ independent gym based ex. program with no limitations safely.     Baseline Pt. will call to check on Silver Sneakers status.    Time 4   Period Weeks   Status On-going               Plan - 04/11/17 1549    Clinical Impression Statement Pt. demonstrates Berg Balance test score of 46/56 indicating decreased static and dynamic standing balance and need for single point cane during ambulation. Pt. tolerated BLE strengthening program including 3-way hip and marching exercises in // bars with 3 lb weights. Pt. required few rest breaks during treatment session due to fatigue. Pt. will continue to benefit from skilled PT in order to improve dynamic standing balance and decrease risk for falls.   Rehab Potential Fair   PT Frequency 2x / week   PT Duration 4 weeks   PT Treatment/Interventions ADLs/Self Care Home Management;Aquatic Therapy;Cryotherapy;Moist Heat;Gait training;Stair training;Functional mobility training;Therapeutic activities;Therapeutic exercise;Balance training;Patient/family education;Neuromuscular re-education;Manual techniques;Passive range of motion   PT Next Visit Plan Progress LE strengthening/ balance and gait.  RECERT next. tx.       Patient will benefit from skilled therapeutic intervention in order to improve the following deficits and impairments:   Abnormal gait, Pain, Decreased coordination, Decreased mobility, Postural dysfunction, Decreased strength, Decreased range of motion, Hypomobility, Decreased endurance, Decreased activity tolerance, Decreased balance, Decreased safety awareness, Difficulty walking, Impaired flexibility  Visit Diagnosis: Rheumatoid arthritis involving multiple sites with positive rheumatoid factor (HCC)  Muscle weakness  Joint stiffness  Difficulty in walking, not elsewhere classified     Problem List  Patient Active Problem List   Diagnosis Date Noted  . Vaginal atrophy 05/14/2013  . Carpal tunnel syndrome   . Drug-induced constipation 06/15/2011  . GERD (gastroesophageal reflux disease) 06/15/2011  . Dysplasia of cervix, low grade (CIN 1)   . Pelvic adhesions   . Herpes   . Peripheral neuropathy   . Diabetes mellitus   . Hemochromatosis   . CANDIDIASIS, ESOPHAGEAL 12/11/2010  . OBSTRUCTIVE SLEEP APNEA 12/07/2010  . HYPERGLYCEMIA 09/25/2010  . NAUSEA 09/08/2010  . ABDOMINAL PAIN, EPIGASTRIC 09/08/2010  . DIARRHEA 06/23/2010  . HEPATOMEGALY 06/23/2010  . 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 PERIPHERAL NEUROPATHY 02/22/2008  . HYPERTENSION 02/22/2008  . GERD 02/22/2008  . FATTY LIVER DISEASE 02/22/2008  . RENAL CYST, LEFT 02/22/2008  . INTERSTITIAL CYSTITIS 02/22/2008  . UTI'S, RECURRENT 02/22/2008  . DEGENERATIVE JOINT DISEASE 02/22/2008  . HERNIATED LUMBOSACRAL DISC 02/22/2008  . [place] DISEASE, CERVICAL 02/22/2008  . FIBROMYALGIA 02/22/2008  . SLEEP APNEA 02/22/2008  . EARLY SATIETY 02/22/2008  . 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 GASTRITIS 01/24/2008  . IRRITABLE BOWEL SYNDROME 01/24/2008  . DEPRESSION 01/19/2008  . GASTROPARESIS 01/19/2008  . MENINGIOMA 05/22/1996   Pura Spice, PT, DPT # [phone] 04/12/2017, 3:57 PM  [place] [facility] [address] [place], [place], [address] Phone: [phone]   Fax:  [phone]  Name: [name] MRN: [id number] Date of Birth: 06-23-1946

## 2017-04-19 ENCOUNTER — Encounter: Payer: Self-pay | Admitting: Physical Therapy

## 2017-04-19 ENCOUNTER — Ambulatory Visit: Payer: Medicare Other | Admitting: Physical Therapy

## 2017-04-19 DIAGNOSIS — M0579 Rheumatoid arthritis with rheumatoid factor of multiple sites without organ or systems involvement: Secondary | ICD-10-CM | POA: Diagnosis not present

## 2017-04-19 DIAGNOSIS — R262 Difficulty in walking, not elsewhere classified: Secondary | ICD-10-CM

## 2017-04-19 DIAGNOSIS — M2560 Stiffness of unspecified joint, not elsewhere classified: Secondary | ICD-10-CM

## 2017-04-19 DIAGNOSIS — M6281 Muscle weakness (generalized): Secondary | ICD-10-CM

## 2017-04-19 NOTE — Therapy (Signed)
[facility] [address] [place], [place], [address] Phone: [phone]   Fax:  [phone]  Physical Therapy Treatment  Patient Details  Name: [name] MRN: [id number] Date of Birth: 01-16-1946 Referring Provider: Maricela Curet, MD  Encounter Date: 04/19/2017      PT End of Session - 04/19/17 1205    Visit Number 12   Number of Visits 20   Date for PT Re-Evaluation 05/17/17   Authorization - Visit Number 6   Authorization - Number of Visits 15   PT Start Time 0949   PT Stop Time 1039   PT Time Calculation (min) 50 min   Equipment Utilized During Treatment Gait belt   Activity Tolerance Patient tolerated treatment well   Behavior During Therapy [facility] for tasks assessed/performed      Past Medical History:  Diagnosis Date  . Candida esophagitis ([place])   . Carpal tunnel syndrome   . Diabetes mellitus    type II  . Dysplasia of cervix, low grade (CIN 1)   . Esophageal yeast infection ([place]) 2011  . Family history of malignant neoplasm of gastrointestinal tract   . Fatty liver   . Fibromyalgia   . Fundic gland polyps of stomach, benign   . Gastritis   . GERD (gastroesophageal reflux disease)   . Hemochromatosis   . Herpes   . Hypertension   . Hypothyroidism   . IBS (irritable bowel syndrome)   . Neuropathy   . Pelvic adhesions   . Peripheral neuropathy   . Psoriatic arthritis ([place])   . Sleep apnea    no CPAP used  . Unspecified gastritis and gastroduodenitis without mention of hemorrhage     Past Surgical History:  Procedure Laterality Date  . APPENDECTOMY     dx lap   . BACK SURGERY     L1-L5  . CERVICAL FUSION     c5-c7  . DL LEFT SALPINGECTOMY LYSIS OF ADHESIONS, RIGHT BTL    . FOOT SURGERY    . lap chloecystectomy    . PELVIC LAPAROSCOPY    . TUBAL LIGATION     RIGHT    There were no vitals filed for this visit.      Subjective Assessment - 04/19/17 1203    Subjective Pt. denies falls since  last tx session and states her R shoulder and R knee are both 2/10 on the NPS. Pt. reports she has been without her arthritis medication since early May and she is lucky if she can get out of the bed in the morning and walk.   Limitations Standing;Walking;House hold activities   Patient Stated Goals Increase balance/ LE muscle strength.     Currently in Pain? Yes   Pain Score 2    Pain Location Knee   Pain Orientation Right   Pain Descriptors / Indicators Aching   Pain Type Chronic pain   Pain Onset More than a month ago   Pain Frequency Constant   Multiple Pain Sites Yes   Pain Score 2   Pain Location Shoulder   Pain Orientation Left   Pain Descriptors / Indicators Aching   Pain Type Chronic pain   Pain Onset More than a month ago   Pain Frequency Constant      Therapeutic Exercise: NuStep x49mL6 (warm up/not billed) Heel raises x20 Mini squats 10x2 Walking in hallway x302f Pt. Reported fatigue and required rest break.   Neuro Re-Ed: Standing on airex feet apart  x2min Standing on airex feet together x2min Airex toe taps to 6in step x20BLE Airex step-ups to 6in step x20BLE. Pt reported fatigue and required rest break. Side stepping on airex balance beam x4min. Stepping over 3in beam x4min. Pt reported fatigue and required rest break.        PT Education - 04/19/17 1205    Education provided Yes   Education Details gait mechanics with emphasis of heel strike   Person(s) Educated Patient   Methods Explanation;Demonstration   Comprehension Verbalized understanding          PT Long Term Goals - 04/19/17 1716      PT LONG TERM GOAL #1   Title Pt. independent with HEP to increase LE muscle strength/endurance to WNL to improve walking/ decrease fall risk.     Baseline Limited endurance/ requires seated rest breaks.  Pt. doing HEP but not everyday per pt. reports.      Time 4   Period Weeks   Status Not Met     PT LONG TERM GOAL #2   Title Pt. will increase  LEFS to >40 out of 80 to improve pain-free mobility/ gait.     Baseline LEFS: 13 out of 80; 24/80 on 6/11   Time 4   Period Weeks   Status On-going     PT LONG TERM GOAL #3   Title Pt. will increase Berg balance test to >50/56 to decrease fall risk/ improve gait.     Baseline Berg 44/56; Berg 39/56 on 6/11   Time 4   Period Weeks   Status On-going     PT LONG TERM GOAL #4   Title Pt. able to ambulate with more normalized gait pattern and appropriate assistive device to improve community mobility/ shopping.    Baseline Improved stridelength with use of RW but inconsistent on varying surfaces.  Verbal cuing required.     Time 4   Period Weeks   Status Partially Met     PT LONG TERM GOAL #5   Title Pt. will progress to [employer]/ independent gym based ex. program with no limitations safely.     Baseline Pt. not able to afford [employer] ex program but hoping her sister with help pay for membership.     Time 4   Period Weeks   Status On-going               Plan - 04/19/17 1207    Clinical Impression Statement Pt. demonstrated decreased step height, step length, and heel strike during ambulation and was educated on proper gait mechanics to decrease risk for falls. Pt. showed ability to tolerate step-ups on airex as well as step-overs with 3in beam. Pt. required use of BUE during all dynamic standing balance activities and required cueing for posture.   Significant thoracic kyphosis and forward head with all standing tasks.  Pt. has been compliant with use of RW with all aspects of walking at home/ outside for safety.  No falls or LOB reported.     Clinical Presentation Stable   Clinical Decision Making Moderate   Rehab Potential Fair   PT Frequency 2x / week   PT Duration 4 weeks   PT Treatment/Interventions ADLs/Self Care Home Management;Aquatic Therapy;Cryotherapy;Moist Heat;Gait training;Stair training;Functional mobility training;Therapeutic activities;Therapeutic  exercise;Balance training;Patient/family education;Neuromuscular re-education;Manual techniques;Passive range of motion   PT Next Visit Plan Progress LE strengthening/ balance and gait.     Consulted and Agree with Plan of Care Patient        Patient will benefit from skilled therapeutic intervention in order to improve the following deficits and impairments:  Abnormal gait, Pain, Decreased coordination, Decreased mobility, Postural dysfunction, Decreased strength, Decreased range of motion, Hypomobility, Decreased endurance, Decreased activity tolerance, Decreased balance, Decreased safety awareness, Difficulty walking, Impaired flexibility  Visit Diagnosis: Rheumatoid arthritis involving multiple sites with positive rheumatoid factor (HCC)  Muscle weakness  Joint stiffness  Difficulty in walking, not elsewhere classified       G-Codes - 04/19/17 1718    Functional Assessment Tool Used (Outpatient Only) Berg Balance Test, LEFS, clinical judgement, gait mechanics   Functional Limitation Mobility: Walking and moving around   Mobility: Walking and Moving Around Current Status (G8978) At least 20 percent but less than 40 percent impaired, limited or restricted   Mobility: Walking and Moving Around Goal Status (G8979) At least 1 percent but less than 20 percent impaired, limited or restricted      Problem List Patient Active Problem List   Diagnosis Date Noted  . Vaginal atrophy 05/14/2013  . Carpal tunnel syndrome   . Drug-induced constipation 06/15/2011  . GERD (gastroesophageal reflux disease) 06/15/2011  . Dysplasia of cervix, low grade (CIN 1)   . Pelvic adhesions   . Herpes   . Peripheral neuropathy   . Diabetes mellitus   . Hemochromatosis   . CANDIDIASIS, ESOPHAGEAL 12/11/2010  . OBSTRUCTIVE SLEEP APNEA 12/07/2010  . HYPERGLYCEMIA 09/25/2010  . NAUSEA 09/08/2010  . ABDOMINAL PAIN, EPIGASTRIC 09/08/2010  . DIARRHEA 06/23/2010  . HEPATOMEGALY 06/23/2010  .  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 PERIPHERAL NEUROPATHY 02/22/2008  . HYPERTENSION 02/22/2008  . GERD 02/22/2008  . FATTY LIVER DISEASE 02/22/2008  . RENAL CYST, LEFT 02/22/2008  . INTERSTITIAL CYSTITIS 02/22/2008  . UTI'S, RECURRENT 02/22/2008  . DEGENERATIVE JOINT DISEASE 02/22/2008  . HERNIATED LUMBOSACRAL DISC 02/22/2008  . DISC DISEASE, CERVICAL 02/22/2008  . FIBROMYALGIA 02/22/2008  . SLEEP APNEA 02/22/2008  . EARLY SATIETY 02/22/2008  . 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 GASTRITIS 01/24/2008  . IRRITABLE BOWEL SYNDROME 01/24/2008  . DEPRESSION 01/19/2008  . GASTROPARESIS 01/19/2008  . MENINGIOMA 05/22/1996   Michael C Sherk, PT, DPT # [id number] Kara McNeill, SPT 04/19/2017, 5:20 PM  [place] [facility] [address] [place], [place], [address] Phone: [phone]   Fax:  [phone]  Name: [name] MRN: [id number] Date of Birth: 08/09/1946   

## 2017-04-26 ENCOUNTER — Ambulatory Visit: Payer: Medicare Other | Admitting: Physical Therapy

## 2017-04-26 ENCOUNTER — Encounter: Payer: Self-pay | Admitting: Physical Therapy

## 2017-04-26 DIAGNOSIS — M0579 Rheumatoid arthritis with rheumatoid factor of multiple sites without organ or systems involvement: Secondary | ICD-10-CM | POA: Diagnosis not present

## 2017-04-26 DIAGNOSIS — R262 Difficulty in walking, not elsewhere classified: Secondary | ICD-10-CM

## 2017-04-26 DIAGNOSIS — M6281 Muscle weakness (generalized): Secondary | ICD-10-CM

## 2017-04-26 DIAGNOSIS — M2560 Stiffness of unspecified joint, not elsewhere classified: Secondary | ICD-10-CM

## 2017-04-26 NOTE — Therapy (Signed)
[facility] [address] [place], [place], [address] Phone: [phone]   Fax:  [phone]  Physical Therapy Treatment  Patient Details  Name: [name] MRN: [id number] Date of Birth: 1946/09/22 Referring Provider: Maricela Curet, MD  Encounter Date: 04/26/2017      PT End of Session - 04/26/17 1212    Visit Number 13   Number of Visits 20   Date for PT Re-Evaluation 05/17/17   Authorization - Visit Number 7   Authorization - Number of Visits 15   PT Start Time 6269   PT Stop Time 1204   PT Time Calculation (min) 48 min   Equipment Utilized During Treatment Gait belt   Activity Tolerance Patient tolerated treatment well   Behavior During Therapy [facility] for tasks assessed/performed      Past Medical History:  Diagnosis Date  . Candida esophagitis ([place])   . Carpal tunnel syndrome   . Diabetes mellitus    type II  . Dysplasia of cervix, low grade (CIN 1)   . Esophageal yeast infection ([place]) 2011  . Family history of malignant neoplasm of gastrointestinal tract   . Fatty liver   . Fibromyalgia   . Fundic gland polyps of stomach, benign   . Gastritis   . GERD (gastroesophageal reflux disease)   . Hemochromatosis   . Herpes   . Hypertension   . Hypothyroidism   . IBS (irritable bowel syndrome)   . Neuropathy   . Pelvic adhesions   . Peripheral neuropathy   . Psoriatic arthritis ([place])   . Sleep apnea    no CPAP used  . Unspecified gastritis and gastroduodenitis without mention of hemorrhage     Past Surgical History:  Procedure Laterality Date  . APPENDECTOMY     dx lap   . BACK SURGERY     L1-L5  . CERVICAL FUSION     c5-c7  . DL LEFT SALPINGECTOMY LYSIS OF ADHESIONS, RIGHT BTL    . FOOT SURGERY    . lap chloecystectomy    . PELVIC LAPAROSCOPY    . TUBAL LIGATION     RIGHT    There were no vitals filed for this visit.      Subjective Assessment - 04/26/17 1210    Subjective Pt. denies falls since  previous tx session and states her pain is 6/10 "all over" and she cannot pin point where it is. Pt states she received two packages of supplies for the arthritis medication but never received confirmation stating she was approved or was getting financial assistance for it.    Limitations Standing;Walking;House hold activities   Patient Stated Goals Increase balance/ LE muscle strength.     Currently in Pain? Yes   Pain Score 6    Pain Location Shoulder  pt states pain is all over   Pain Orientation Right   Pain Descriptors / Indicators Aching   Pain Type Chronic pain   Pain Onset More than a month ago   Pain Frequency Constant   Multiple Pain Sites No   Pain Onset More than a month ago      Therapeutic Exercise: NuStep x30mL6 (warm up/not billed) Walking in hallway x3064f  3 way hip in // bars x10 BLE Marching in // bars x25 BLE Heel raises in // bars x15 BLE Mini squats in // bars x15    Neuro Re-Ed: Airex cone sorting to [id number] [id number] cone sorting side to side x6min46m  Dynamic standing balance while drawing ABCs with foot on ball x95mn   Gait training: Pt. Ambulated in hallway with no AD and demonstrated decreased heel strike, narrow BOS, decreased arm swing and left lateral lean. Pt required mod verbal cueing on proper heel strike and increasing BOS, as well as increasing arm swing and posture correction.    Pt required frequent postural correction t/o tx session and required CGA during all activities.         PT Education - 04/26/17 1212    Education provided Yes   Education Details continuous of HEP and safety with HEP   Person(s) Educated Patient   Methods Explanation;Demonstration   Comprehension Verbalized understanding;Returned demonstration             PT Long Term Goals - 04/19/17 1716      PT LONG TERM GOAL #1   Title Pt. independent with HEP to increase LE muscle strength/endurance to WNL to improve walking/ decrease fall risk.      Baseline Limited endurance/ requires seated rest breaks.  Pt. doing HEP but not everyday per pt. reports.      Time 4   Period Weeks   Status Not Met     PT LONG TERM GOAL #2   Title Pt. will increase LEFS to >40 out of 80 to improve pain-free mobility/ gait.     Baseline LEFS: 13 out of 80; 24/80 on 6/11   Time 4   Period Weeks   Status On-going     PT LONG TERM GOAL #3   Title Pt. will increase Berg balance test to >50/56 to decrease fall risk/ improve gait.     Baseline Berg 44/56; Berg 39/56 on 6/11   Time 4   Period Weeks   Status On-going     PT LONG TERM GOAL #4   Title Pt. able to ambulate with more normalized gait pattern and appropriate assistive device to improve community mobility/ shopping.    Baseline Improved stridelength with use of RW but inconsistent on varying surfaces.  Verbal cuing required.     Time 4   Period Weeks   Status Partially Met     PT LONG TERM GOAL #5   Title Pt. will progress to [employer]/ independent gym based ex. program with no limitations safely.     Baseline Pt. not able to afford [employer] ex program but hoping her sister with help pay for membership.     Time 4   Period Weeks   Status On-going             Plan - 04/26/17 1213    Clinical Impression Statement Pt demonstrates slow gait speed with decreased heel strike, hip extension, and arm swing. Pt. was educated on proper gait mechanics and required postural correction throughout. Pt. tolerated dynamic standing balance activities with no loss of balance. Pt. required CGA during all dynamic balance activities and gait training.    Clinical Presentation Stable   Clinical Decision Making Moderate   Rehab Potential Fair   PT Frequency 2x / week   PT Duration 4 weeks   PT Treatment/Interventions ADLs/Self Care Home Management;Aquatic Therapy;Cryotherapy;Moist Heat;Gait training;Stair training;Functional mobility training;Therapeutic activities;Therapeutic  exercise;Balance training;Patient/family education;Neuromuscular re-education;Manual techniques;Passive range of motion   PT Next Visit Plan Progress LE strengthening/ balance and gait.     Consulted and Agree with Plan of Care Patient      Patient will benefit from skilled therapeutic intervention in order to improve the following  deficits and impairments:  Abnormal gait, Pain, Decreased coordination, Decreased mobility, Postural dysfunction, Decreased strength, Decreased range of motion, Hypomobility, Decreased endurance, Decreased activity tolerance, Decreased balance, Decreased safety awareness, Difficulty walking, Impaired flexibility  Visit Diagnosis: Rheumatoid arthritis involving multiple sites with positive rheumatoid factor (HCC)  Muscle weakness  Joint stiffness  Difficulty in walking, not elsewhere classified     Problem List Patient Active Problem List   Diagnosis Date Noted  . Vaginal atrophy 05/14/2013  . Carpal tunnel syndrome   . Drug-induced constipation 06/15/2011  . GERD (gastroesophageal reflux disease) 06/15/2011  . Dysplasia of cervix, low grade (CIN 1)   . Pelvic adhesions   . Herpes   . Peripheral neuropathy   . Diabetes mellitus   . Hemochromatosis   . CANDIDIASIS, ESOPHAGEAL 12/11/2010  . OBSTRUCTIVE SLEEP APNEA 12/07/2010  . HYPERGLYCEMIA 09/25/2010  . NAUSEA 09/08/2010  . ABDOMINAL PAIN, EPIGASTRIC 09/08/2010  . DIARRHEA 06/23/2010  . HEPATOMEGALY 06/23/2010  . 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 PERIPHERAL NEUROPATHY 02/22/2008  . HYPERTENSION 02/22/2008  . GERD 02/22/2008  . FATTY LIVER DISEASE 02/22/2008  . RENAL CYST, LEFT 02/22/2008  . INTERSTITIAL CYSTITIS 02/22/2008  . UTI'S, RECURRENT 02/22/2008  . DEGENERATIVE JOINT DISEASE 02/22/2008  . HERNIATED LUMBOSACRAL DISC 02/22/2008  . [place] DISEASE, CERVICAL 02/22/2008  . FIBROMYALGIA 02/22/2008  . SLEEP APNEA 02/22/2008  . EARLY SATIETY 02/22/2008  . 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  GASTRITIS 01/24/2008  . IRRITABLE BOWEL SYNDROME 01/24/2008  . DEPRESSION 01/19/2008  . GASTROPARESIS 01/19/2008  . MENINGIOMA 05/22/1996   Pura Spice, PT, DPT # [id number] Betsy Coder, SPT 04/26/2017, 3:47 PM  [facility] [address] [place], [place], [address] Phone: [phone]   Fax:  [phone]  Name: [name] MRN: [id number] Date of Birth: [birth year]

## 2017-05-03 ENCOUNTER — Encounter: Payer: Self-pay | Admitting: Physical Therapy

## 2017-05-03 ENCOUNTER — Ambulatory Visit: Payer: Medicare Other | Admitting: Physical Therapy

## 2017-05-03 DIAGNOSIS — R262 Difficulty in walking, not elsewhere classified: Secondary | ICD-10-CM

## 2017-05-03 DIAGNOSIS — M2560 Stiffness of unspecified joint, not elsewhere classified: Secondary | ICD-10-CM

## 2017-05-03 DIAGNOSIS — M0579 Rheumatoid arthritis with rheumatoid factor of multiple sites without organ or systems involvement: Secondary | ICD-10-CM

## 2017-05-03 DIAGNOSIS — M6281 Muscle weakness (generalized): Secondary | ICD-10-CM

## 2017-05-03 NOTE — Therapy (Signed)
[facility] [address] [place], [place], [address] Phone: [phone]   Fax:  [phone]  Physical Therapy Treatment  Patient Details  Name: [name] MRN: [id number] Date of Birth: [birth year] Referring Provider: Maricela Curet, MD  Encounter Date: 05/03/2017      PT End of Session - 05/03/17 1129    Visit Number 14   Number of Visits 20   Date for PT Re-Evaluation 05/17/17   Authorization - Visit Number 8   Authorization - Number of Visits 15   PT Start Time 1119   PT Stop Time 1159   PT Time Calculation (min) 40 min   Equipment Utilized During Treatment Gait belt   Activity Tolerance Patient tolerated treatment well   Behavior During Therapy [facility] for tasks assessed/performed      Past Medical History:  Diagnosis Date  . Candida esophagitis ([place])   . Carpal tunnel syndrome   . Diabetes mellitus    type II  . Dysplasia of cervix, low grade (CIN 1)   . Esophageal yeast infection ([place]) 2011  . Family history of malignant neoplasm of gastrointestinal tract   . Fatty liver   . Fibromyalgia   . Fundic gland polyps of stomach, benign   . Gastritis   . GERD (gastroesophageal reflux disease)   . Hemochromatosis   . Herpes   . Hypertension   . Hypothyroidism   . IBS (irritable bowel syndrome)   . Neuropathy   . Pelvic adhesions   . Peripheral neuropathy   . Psoriatic arthritis ([place])   . Sleep apnea    no CPAP used  . Unspecified gastritis and gastroduodenitis without mention of hemorrhage     Past Surgical History:  Procedure Laterality Date  . APPENDECTOMY     dx lap   . BACK SURGERY     L1-L5  . CERVICAL FUSION     c5-c7  . DL LEFT SALPINGECTOMY LYSIS OF ADHESIONS, RIGHT BTL    . FOOT SURGERY    . lap chloecystectomy    . PELVIC LAPAROSCOPY    . TUBAL LIGATION     RIGHT    There were no vitals filed for this visit.      Subjective Assessment - 05/03/17 1125    Subjective Pt denies falls since  previous tx session and states 3/10 pain in R shoulder and B knees. Pt reports starting prednisone last week and states it has helped. She states she still does not have her arthritis medication because the form the doctor's office sent in had expired. She has started the process over again and is hoping to hear from them soon.    Limitations Standing;Walking;House hold activities   Patient Stated Goals Increase balance/ LE muscle strength.     Currently in Pain? Yes   Pain Score 3    Pain Location Shoulder   Pain Orientation Right   Pain Descriptors / Indicators Aching   Pain Type Chronic pain   Pain Onset More than a month ago   Pain Frequency Constant   Multiple Pain Sites Yes   Pain Score 3   Pain Location Knee   Pain Orientation Right;Left   Pain Descriptors / Indicators Aching   Pain Type Chronic pain   Pain Onset More than a month ago   Pain Frequency Constant     Treatment:  Therapeutic Exercise: NuStep x15mL6 (warm up/not billed) Resisted walking fwd/bwd/side/side x5 each  Marching in // bars x6  laps Heel raises in // bars x15 BLE Mini squats in // bars x15     Neuro Re-Ed: Overhead cone sorting to 59f shelf x463m Airex overhead cone sorting to 61f58fhelf x4mi86mhrowing towel onto nautilus bar 6.5ft 83fto simulate pt throwing bath mat over shower curtain          PT Education - 05/03/17 1711    Education provided Yes   Education Details importance of HEP and continued movement to prevent worsening of symptoms   Person(s) Educated Patient   Methods Explanation   Comprehension Verbalized understanding             PT Long Term Goals - 04/19/17 1716      PT LONG TERM GOAL #1   Title Pt. independent with HEP to increase LE muscle strength/endurance to WNL to improve walking/ decrease fall risk.     Baseline Limited endurance/ requires seated rest breaks.  Pt. doing HEP but not everyday per pt. reports.      Time 4   Period Weeks   Status Not Met      PT LONG TERM GOAL #2   Title Pt. will increase LEFS to >40 out of 80 to improve pain-free mobility/ gait.     Baseline LEFS: 13 out of 80; 24/80 on 6/11   Time 4   Period Weeks   Status On-going     PT LONG TERM GOAL #3   Title Pt. will increase Berg balance test to >50/56 to decrease fall risk/ improve gait.     Baseline Berg 44/56; Berg 39/56 on 6/11   Time 4   Period Weeks   Status On-going     PT LONG TERM GOAL #4   Title Pt. able to ambulate with more normalized gait pattern and appropriate assistive device to improve community mobility/ shopping.    Baseline Improved stridelength with use of RW but inconsistent on varying surfaces.  Verbal cuing required.     Time 4   Period Weeks   Status Partially Met     PT LONG TERM GOAL #5   Title Pt. will progress to [employer]/ independent gym based ex. program with no limitations safely.     Baseline Pt. not able to afford [employer]ne[employer] ex program but hoping her sister with help pay for membership.     Time 4   Period Weeks   Status On-going               Plan - 05/03/17 1712    Clinical Impression Statement Pt continues to demonstrate decreased gait speed and decreased step height, and required verbal cueing to increase heel strike. Pt noted she has difficulty at home with placing bath mat over shower curtain rod to dry, so PT simulated activity with overhead cone sorting and having pt place towel over Nautilus bar approx 6.5ft f9f floor. Pt demonstrated improvement in ability to reach overhead with practice and reports more confidence with activity. Pt required CGA during all dynamic standing balance activities for safety purposes.    Clinical Presentation Stable   Clinical Decision Making Moderate   Rehab Potential Fair   PT Frequency 2x / week   PT Duration 4 weeks   PT Treatment/Interventions ADLs/Self Care Home Management;Aquatic Therapy;Cryotherapy;Moist Heat;Gait training;Stair training;Functional mobility  training;Therapeutic activities;Therapeutic exercise;Balance training;Patient/family education;Neuromuscular re-education;Manual techniques;Passive range of motion   PT Next Visit Plan Progress LE strengthening/ balance and gait.     Consulted and Agree with Plan of Care  Patient      Patient will benefit from skilled therapeutic intervention in order to improve the following deficits and impairments:  Abnormal gait, Pain, Decreased coordination, Decreased mobility, Postural dysfunction, Decreased strength, Decreased range of motion, Hypomobility, Decreased endurance, Decreased activity tolerance, Decreased balance, Decreased safety awareness, Difficulty walking, Impaired flexibility  Visit Diagnosis: Rheumatoid arthritis involving multiple sites with positive rheumatoid factor (HCC)  Muscle weakness  Joint stiffness  Difficulty in walking, not elsewhere classified     Problem List Patient Active Problem List   Diagnosis Date Noted  . Vaginal atrophy 05/14/2013  . Carpal tunnel syndrome   . Drug-induced constipation 06/15/2011  . GERD (gastroesophageal reflux disease) 06/15/2011  . Dysplasia of cervix, low grade (CIN 1)   . Pelvic adhesions   . Herpes   . Peripheral neuropathy   . Diabetes mellitus   . Hemochromatosis   . CANDIDIASIS, ESOPHAGEAL 12/11/2010  . OBSTRUCTIVE SLEEP APNEA 12/07/2010  . HYPERGLYCEMIA 09/25/2010  . NAUSEA 09/08/2010  . ABDOMINAL PAIN, EPIGASTRIC 09/08/2010  . DIARRHEA 06/23/2010  . HEPATOMEGALY 06/23/2010  . 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 PERIPHERAL NEUROPATHY 02/22/2008  . HYPERTENSION 02/22/2008  . GERD 02/22/2008  . FATTY LIVER DISEASE 02/22/2008  . RENAL CYST, LEFT 02/22/2008  . INTERSTITIAL CYSTITIS 02/22/2008  . UTI'S, RECURRENT 02/22/2008  . DEGENERATIVE JOINT DISEASE 02/22/2008  . HERNIATED LUMBOSACRAL DISC 02/22/2008  . [place] DISEASE, CERVICAL 02/22/2008  . FIBROMYALGIA 02/22/2008  . SLEEP APNEA 02/22/2008  . EARLY SATIETY  02/22/2008  . 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 GASTRITIS 01/24/2008  . IRRITABLE BOWEL SYNDROME 01/24/2008  . DEPRESSION 01/19/2008  . GASTROPARESIS 01/19/2008  . MENINGIOMA 05/22/1996   Pura Spice, PT, DPT # [id number] Betsy Coder, SPT 05/03/2017, 5:25 PM  [facility] [facility] [address] [place], [place], [address] Phone: [phone]   Fax:  [phone]  Name: NITASHA JEWEL MRN: [id number] Date of Birth: 06/29/1946

## 2017-05-10 ENCOUNTER — Encounter: Payer: Medicare Other | Admitting: Physical Therapy

## 2017-05-12 ENCOUNTER — Encounter: Payer: Medicare Other | Admitting: Physical Therapy

## 2017-05-16 ENCOUNTER — Ambulatory Visit: Payer: Medicare Other | Admitting: Physical Therapy

## 2017-05-18 ENCOUNTER — Ambulatory Visit: Payer: Medicare Other | Attending: Urology | Admitting: Physical Therapy

## 2017-05-18 DIAGNOSIS — M2560 Stiffness of unspecified joint, not elsewhere classified: Secondary | ICD-10-CM

## 2017-05-18 DIAGNOSIS — R262 Difficulty in walking, not elsewhere classified: Secondary | ICD-10-CM | POA: Diagnosis present

## 2017-05-18 DIAGNOSIS — M0579 Rheumatoid arthritis with rheumatoid factor of multiple sites without organ or systems involvement: Secondary | ICD-10-CM | POA: Diagnosis not present

## 2017-05-18 DIAGNOSIS — M6281 Muscle weakness (generalized): Secondary | ICD-10-CM | POA: Diagnosis present

## 2017-05-18 NOTE — Therapy (Signed)
[place] [facility] [address] [place], [place], [address] Phone: [phone]   Fax:  [phone]  Physical Therapy Treatment  Patient Details  Name: [name] MRN: [id number] Date of Birth: 1945/11/30 Referring Provider: Maricela Curet, MD  Encounter Date: 05/18/2017      PT End of Session - 05/18/17 1700    Visit Number 15   Number of Visits 20   Date for PT Re-Evaluation 05/17/17   Authorization - Visit Number 9   Authorization - Number of Visits 15   PT Start Time 1224   PT Stop Time 1429   PT Time Calculation (min) 51 min   Equipment Utilized During Treatment Gait belt   Activity Tolerance Patient tolerated treatment well   Behavior During Therapy [facility] for tasks assessed/performed      Past Medical History:  Diagnosis Date  . Candida esophagitis ([place])   . Carpal tunnel syndrome   . Diabetes mellitus    type II  . Dysplasia of cervix, low grade (CIN 1)   . Esophageal yeast infection ([place]) 2011  . Family history of malignant neoplasm of gastrointestinal tract   . Fatty liver   . Fibromyalgia   . Fundic gland polyps of stomach, benign   . Gastritis   . GERD (gastroesophageal reflux disease)   . Hemochromatosis   . Herpes   . Hypertension   . Hypothyroidism   . IBS (irritable bowel syndrome)   . Neuropathy   . Pelvic adhesions   . Peripheral neuropathy   . Psoriatic arthritis ([place])   . Sleep apnea    no CPAP used  . Unspecified gastritis and gastroduodenitis without mention of hemorrhage     Past Surgical History:  Procedure Laterality Date  . APPENDECTOMY     dx lap   . BACK SURGERY     L1-L5  . CERVICAL FUSION     c5-c7  . DL LEFT SALPINGECTOMY LYSIS OF ADHESIONS, RIGHT BTL    . FOOT SURGERY    . lap chloecystectomy    . PELVIC LAPAROSCOPY    . TUBAL LIGATION     RIGHT    There were no vitals filed for this visit.      Subjective Assessment - 05/18/17 1658    Subjective Pt reports 0/10 pain  today and states she is having a good day. Pt has had 1 fall since previous tx session when she was going around her sister's car and was tripped by a branch. Pt reports she had some soreness, but has overall recovered well from the fall.    Limitations Standing;Walking;House hold activities   Patient Stated Goals Increase balance/ LE muscle strength.     Currently in Pain? No/denies   Pain Score 0-No pain   Multiple Pain Sites No   Pain Onset More than a month ago     Berg: 49/56  LEFS: 33/80  Treatment:  TherEx: Marching in place in // bars x25 Squatting in // bars x25 3way hip x15 BLE  Stepping over 3" planks x10 laps Heel raises in // bars x25  Reviewed home program with pt and instructed pt to call if any issues       PT Education - 05/18/17 1700    Education provided Yes   Education Details importance of continuing HEP in order to maintain independence   Person(s) Educated Patient   Methods Explanation   Comprehension Verbalized understanding  PT Long Term Goals - May 25, 2017 1707      PT LONG TERM GOAL #1   Title Pt. independent with HEP to increase LE muscle strength/endurance to WNL to improve walking/ decrease fall risk.     Baseline Limited endurance/ requires seated rest breaks.  Pt. doing HEP but not everyday per pt. reports.      Time 4   Period Weeks   Status Partially Met     PT LONG TERM GOAL #2   Title Pt. will increase LEFS to >40 out of 80 to improve pain-free mobility/ gait.     Baseline LEFS: 13 out of 80; 24/80 on 6/11; 33/80 on 2017-05-25   Time 4   Period Weeks   Status Partially Met     PT LONG TERM GOAL #3   Title Pt. will increase Berg balance test to >50/56 to decrease fall risk/ improve gait.     Baseline Berg 44/56; Berg 39/56 on 6/11; 49/56 on 2023-05-26   Time 4   Period Weeks   Status Achieved     PT LONG TERM GOAL #4   Title Pt. able to ambulate with more normalized gait pattern and appropriate assistive device to improve  community mobility/ shopping.    Baseline Improved stridelength with use of RW but inconsistent on varying surfaces.  Verbal cuing required.     Time 4   Period Weeks   Status Achieved     PT LONG TERM GOAL #5   Title Pt. will progress to [employer]/ independent gym based ex. program with no limitations safely.     Baseline Pt. not able to afford [employer] ex program but hoping her sister with help pay for membership.     Time 4   Period Weeks   Status On-going           Plan - May 25, 2017 1701    Clinical Impression Statement Pt reports to PT with slow gait speed and limited step length. Pt required verbal cueing to increase heel strike and reports she thinks of this often when she is at home. Pt also reports she is better able to throw bath mat over shower curtain and demonstrates increased static and dynamic standing balance as made evident by Berg score of 49/56 improved from 39-56 on 6/11. Pt also demonstrates increased LE function by scoring 33/80 improved from 24/80 on 6/11. Pt educated on importance of continuing HEP and staying active to maintain independence. Pt discharged to HEP.   Clinical Presentation Stable   Clinical Decision Making Moderate   Rehab Potential Fair   PT Frequency 2x / week   PT Duration 4 weeks   PT Treatment/Interventions ADLs/Self Care Home Management;Aquatic Therapy;Cryotherapy;Moist Heat;Gait training;Stair training;Functional mobility training;Therapeutic activities;Therapeutic exercise;Balance training;Patient/family education;Neuromuscular re-education;Manual techniques;Passive range of motion   PT Next Visit Plan Discharge   Consulted and Agree with Plan of Care Patient      Patient will benefit from skilled therapeutic intervention in order to improve the following deficits and impairments:  Abnormal gait, Pain, Decreased coordination, Decreased mobility, Postural dysfunction, Decreased strength, Decreased range of motion, Hypomobility,  Decreased endurance, Decreased activity tolerance, Decreased balance, Decreased safety awareness, Difficulty walking, Impaired flexibility  Visit Diagnosis: Rheumatoid arthritis involving multiple sites with positive rheumatoid factor (HCC)  Muscle weakness  Joint stiffness  Difficulty in walking, not elsewhere classified       G-Codes - 2017-05-25 1732    Functional Assessment Tool Used (Outpatient Only) Berg Balance Test, LEFS, clinical judgement,  gait mechanics   Functional Limitation Mobility: Walking and moving around   Mobility: Walking and Moving Around Current Status [phone]) At least 20 percent but less than 40 percent impaired, limited or restricted   Mobility: Walking and Moving Around Goal Status [phone]) At least 20 percent but less than 40 percent impaired, limited or restricted   Mobility: Walking and Moving Around Discharge Status [phone]) At least 20 percent but less than 40 percent impaired, limited or restricted      Problem List Patient Active Problem List   Diagnosis Date Noted  . Vaginal atrophy 05/14/2013  . Carpal tunnel syndrome   . Drug-induced constipation 06/15/2011  . GERD (gastroesophageal reflux disease) 06/15/2011  . Dysplasia of cervix, low grade (CIN 1)   . Pelvic adhesions   . Herpes   . Peripheral neuropathy   . Diabetes mellitus   . Hemochromatosis   . CANDIDIASIS, ESOPHAGEAL 12/11/2010  . OBSTRUCTIVE SLEEP APNEA 12/07/2010  . HYPERGLYCEMIA 09/25/2010  . NAUSEA 09/08/2010  . ABDOMINAL PAIN, EPIGASTRIC 09/08/2010  . DIARRHEA 06/23/2010  . HEPATOMEGALY 06/23/2010  . 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 PERIPHERAL NEUROPATHY 02/22/2008  . HYPERTENSION 02/22/2008  . GERD 02/22/2008  . FATTY LIVER DISEASE 02/22/2008  . RENAL CYST, LEFT 02/22/2008  . INTERSTITIAL CYSTITIS 02/22/2008  . UTI'S, RECURRENT 02/22/2008  . DEGENERATIVE JOINT DISEASE 02/22/2008  . HERNIATED LUMBOSACRAL DISC 02/22/2008  . [place] DISEASE, CERVICAL 02/22/2008  .  FIBROMYALGIA 02/22/2008  . SLEEP APNEA 02/22/2008  . EARLY SATIETY 02/22/2008  . 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 GASTRITIS 01/24/2008  . IRRITABLE BOWEL SYNDROME 01/24/2008  . DEPRESSION 01/19/2008  . GASTROPARESIS 01/19/2008  . MENINGIOMA 05/22/1996   Pura Spice, PT, DPT # [id number] Betsy Coder, SPT 05/18/2017, 5:32 PM  [place] [facility] [address] [place], [place], [address] Phone: [phone]   Fax:  [phone]  Name: [name] MRN: [id number] Date of Birth: [birth year]

## 2017-06-23 ENCOUNTER — Ambulatory Visit: Payer: Medicare Other

## 2017-06-23 ENCOUNTER — Ambulatory Visit
Admission: EM | Admit: 2017-06-23 | Discharge: 2017-06-23 | Disposition: A | Discharge status: Home / Self Care | Payer: Medicare Other | Attending: Emergency Medicine | Admitting: Emergency Medicine

## 2017-06-23 ENCOUNTER — Encounter: Payer: Self-pay | Admitting: *Deleted

## 2017-06-23 DIAGNOSIS — E039 Hypothyroidism, unspecified: Secondary | ICD-10-CM | POA: Diagnosis not present

## 2017-06-23 DIAGNOSIS — W182XXA Fall in (into) shower or empty bathtub, initial encounter: Secondary | ICD-10-CM | POA: Diagnosis not present

## 2017-06-23 DIAGNOSIS — M546 Pain in thoracic spine: Secondary | ICD-10-CM | POA: Insufficient documentation

## 2017-06-23 DIAGNOSIS — Z7982 Long term (current) use of aspirin: Secondary | ICD-10-CM | POA: Insufficient documentation

## 2017-06-23 DIAGNOSIS — K219 Gastro-esophageal reflux disease without esophagitis: Secondary | ICD-10-CM | POA: Diagnosis not present

## 2017-06-23 DIAGNOSIS — M5136 Other intervertebral disc degeneration, lumbar region: Secondary | ICD-10-CM | POA: Insufficient documentation

## 2017-06-23 DIAGNOSIS — S42114A Nondisplaced fracture of body of scapula, right shoulder, initial encounter for closed fracture: Secondary | ICD-10-CM | POA: Diagnosis not present

## 2017-06-23 DIAGNOSIS — L4050 Arthropathic psoriasis, unspecified: Secondary | ICD-10-CM | POA: Insufficient documentation

## 2017-06-23 DIAGNOSIS — I10 Essential (primary) hypertension: Secondary | ICD-10-CM | POA: Diagnosis not present

## 2017-06-23 DIAGNOSIS — G5600 Carpal tunnel syndrome, unspecified upper limb: Secondary | ICD-10-CM | POA: Diagnosis not present

## 2017-06-23 DIAGNOSIS — F329 Major depressive disorder, single episode, unspecified: Secondary | ICD-10-CM | POA: Insufficient documentation

## 2017-06-23 DIAGNOSIS — W19XXXA Unspecified fall, initial encounter: Secondary | ICD-10-CM | POA: Diagnosis not present

## 2017-06-23 DIAGNOSIS — M25511 Pain in right shoulder: Secondary | ICD-10-CM | POA: Diagnosis not present

## 2017-06-23 DIAGNOSIS — M797 Fibromyalgia: Secondary | ICD-10-CM | POA: Diagnosis not present

## 2017-06-23 DIAGNOSIS — M549 Dorsalgia, unspecified: Secondary | ICD-10-CM | POA: Diagnosis present

## 2017-06-23 DIAGNOSIS — N281 Cyst of kidney, acquired: Secondary | ICD-10-CM | POA: Insufficient documentation

## 2017-06-23 DIAGNOSIS — Y93E1 Activity, personal bathing and showering: Secondary | ICD-10-CM | POA: Insufficient documentation

## 2017-06-23 DIAGNOSIS — G4733 Obstructive sleep apnea (adult) (pediatric): Secondary | ICD-10-CM | POA: Diagnosis not present

## 2017-06-23 DIAGNOSIS — Z981 Arthrodesis status: Secondary | ICD-10-CM | POA: Insufficient documentation

## 2017-06-23 DIAGNOSIS — K589 Irritable bowel syndrome without diarrhea: Secondary | ICD-10-CM | POA: Insufficient documentation

## 2017-06-23 DIAGNOSIS — E119 Type 2 diabetes mellitus without complications: Secondary | ICD-10-CM | POA: Insufficient documentation

## 2017-06-23 DIAGNOSIS — Z7984 Long term (current) use of oral hypoglycemic drugs: Secondary | ICD-10-CM | POA: Insufficient documentation

## 2017-06-23 MED ORDER — TRAMADOL HCL 50 MG PO TABS: 50.0000 mg | ORAL_TABLET | Freq: Four times a day (QID) | ORAL | 0 refills | Status: AC | PRN

## 2017-06-23 MED ORDER — MELOXICAM 7.5 MG PO TABS: 7.5000 mg | ORAL_TABLET | Freq: Every day | ORAL | 0 refills | Status: DC | PRN

## 2017-06-23 NOTE — ED Provider Notes (Addendum)
[facility]    CSN: [id number] Arrival date & time: 06/23/17  1102     History   Chief Complaint Chief Complaint  Patient presents with  . Fall  . Back Pain  . Shoulder Pain    HPI [name] is a 71 y.o. female.   Patient is a 71 year old female with history of chronic back pain and psoriatic arthritis who presents complaining of back and right shoulder pain after falling in the bathtub this morning. Patient states she was taking a shower in the bathtub facing towards the end of the tub when she slipped and fell backwards, hitting her shoulder and back on the faucet and right side of the tub. Patient states this is more than her normal arthritis associated pain. Patient denies any numbness or tingling, only pain to the shoulder.  Total patient does have a history of psoriatic arthritis and was recently on Remicade but has not gotten approval to start to Cosyntex. Patient also has had fusion of vertebra C5 and C6 as well as laminectomies of L1-L5.      Past Medical History:  Diagnosis Date  . Candida esophagitis ([place])   . Carpal tunnel syndrome   . Diabetes mellitus    type II  . Dysplasia of cervix, low grade (CIN 1)   . Esophageal yeast infection ([place]) 2011  . Family history of malignant neoplasm of gastrointestinal tract   . Fatty liver   . Fibromyalgia   . Fundic gland polyps of stomach, benign   . Gastritis   . GERD (gastroesophageal reflux disease)   . Hemochromatosis   . Herpes   . Hypertension   . Hypothyroidism   . IBS (irritable bowel syndrome)   . Neuropathy   . Pelvic adhesions   . Peripheral neuropathy   . Psoriatic arthritis ([place])   . Sleep apnea    no CPAP used  . Unspecified gastritis and gastroduodenitis without mention of hemorrhage     Patient Active Problem List   Diagnosis Date Noted  . Vaginal atrophy 05/14/2013  . Carpal tunnel syndrome   . Drug-induced constipation 06/15/2011  . GERD (gastroesophageal reflux  disease) 06/15/2011  . Dysplasia of cervix, low grade (CIN 1)   . Pelvic adhesions   . Herpes   . Peripheral neuropathy   . Diabetes mellitus   . Hemochromatosis   . CANDIDIASIS, ESOPHAGEAL 12/11/2010  . OBSTRUCTIVE SLEEP APNEA 12/07/2010  . HYPERGLYCEMIA 09/25/2010  . NAUSEA 09/08/2010  . ABDOMINAL PAIN, EPIGASTRIC 09/08/2010  . DIARRHEA 06/23/2010  . HEPATOMEGALY 06/23/2010  . 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 PERIPHERAL NEUROPATHY 02/22/2008  . HYPERTENSION 02/22/2008  . GERD 02/22/2008  . FATTY LIVER DISEASE 02/22/2008  . RENAL CYST, LEFT 02/22/2008  . INTERSTITIAL CYSTITIS 02/22/2008  . UTI'S, RECURRENT 02/22/2008  . DEGENERATIVE JOINT DISEASE 02/22/2008  . HERNIATED LUMBOSACRAL DISC 02/22/2008  . [place] DISEASE, CERVICAL 02/22/2008  . FIBROMYALGIA 02/22/2008  . SLEEP APNEA 02/22/2008  . EARLY SATIETY 02/22/2008  . 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 GASTRITIS 01/24/2008  . IRRITABLE BOWEL SYNDROME 01/24/2008  . DEPRESSION 01/19/2008  . GASTROPARESIS 01/19/2008  . MENINGIOMA 05/22/1996    Past Surgical History:  Procedure Laterality Date  . APPENDECTOMY     dx lap   . BACK SURGERY     L1-L5  . CERVICAL FUSION     c5-c7  . DL LEFT SALPINGECTOMY LYSIS OF ADHESIONS, RIGHT BTL    . FOOT SURGERY    . lap chloecystectomy    .  PELVIC LAPAROSCOPY    . TUBAL LIGATION     RIGHT    OB History    Gravida Para Term Preterm AB Living   0             SAB TAB Ectopic Multiple Live Births                   Home Medications    Prior to Admission medications   Medication Sig Start Date End Date Taking? Authorizing Provider  aspirin 81 MG tablet Take 81 mg by mouth daily.   Yes [provider]  atorvastatin (LIPITOR) 10 MG tablet Take 10 mg by mouth daily.   Yes [provider]  Calcium Carbonate-Vitamin D (CALCIUM + D PO) Take 1 tablet by mouth daily.    Yes [provider]  DULoxetine (CYMBALTA) 30 MG capsule Take 30 mg by mouth 2 (two) times  daily.   Yes [provider]  lamoTRIgine (LAMICTAL) 200 MG tablet Take 200 mg by mouth daily.     Yes [provider]  levothyroxine (SYNTHROID, LEVOTHROID) 25 MCG tablet Take 25 mcg by mouth daily.     Yes [provider]  lisinopril (PRINIVIL,ZESTRIL) 20 MG tablet Take 20 mg by mouth daily.   Yes [provider]  Melatonin 1 MG TABS Take by mouth.   Yes [provider]  metFORMIN (GLUCOPHAGE) 500 MG tablet Take by mouth 2 (two) times daily with a meal.   Yes [provider]  metoprolol tartrate (LOPRESSOR) 50 MG tablet Take 50 mg by mouth 2 (two) times daily.   Yes [provider]  omeprazole (PRILOSEC) 40 MG capsule Take 1 capsule (40 mg total) by mouth daily. 07/14/12  Yes Sable Feil, MD  ondansetron (ZOFRAN) 8 MG tablet Take by mouth every 8 (eight) hours as needed for nausea or vomiting.   Yes [provider]  predniSONE (DELTASONE) 2.5 MG tablet Take 2.5 mg by mouth daily with breakfast.   Yes [provider]  pregabalin (LYRICA) 100 MG capsule Take 150 mg by mouth 2 (two) times daily.    Yes [provider]  spironolactone (ALDACTONE) 25 MG tablet Take 25 mg by mouth daily.   Yes [provider]  traMADol (ULTRAM) 50 MG tablet Take by mouth every 6 (six) hours as needed.   Yes [provider]  valACYclovir (VALTREX) 500 MG tablet TAKE 1 TABLET DAILY 08/25/15  Yes Terrance Mass, MD  cetirizine (ZYRTEC) 10 MG tablet Take 10 mg by mouth daily.    [provider]  folic acid (FOLVITE) 1 MG tablet Take 1 mg by mouth daily.    [provider]  furosemide (LASIX) 40 MG tablet Take 40 mg by mouth 2 (two) times daily.      [provider]  glimepiride (AMARYL) 4 MG tablet Take 4 mg by mouth daily before breakfast.      [provider]  hydrochlorothiazide (HYDRODIURIL) 25 MG tablet Take 25 mg by mouth daily.    [provider]    InFLIXimab (REMICADE IV) Inject into the vein.    [provider]  meloxicam (MOBIC) 7.5 MG tablet Take 1 tablet (7.5 mg total) by mouth daily as needed for pain. 06/23/17   Luvenia Redden, PA-C  Multiple Vitamin (MULTIVITAMIN) tablet Take 1 tablet by mouth daily.      [provider]  nitroGLYCERIN (NITROSTAT) 0.4 MG SL tablet Place 0.4 mg under the tongue  every 5 (five) minutes as needed for chest pain.    [provider]  NONFORMULARY OR COMPOUNDED ITEM Estradiol 0.02 % 60ml prefilled applicator Sig: apply twice a week 06/03/15   Terrance Mass, MD  potassium chloride SA (K-DUR,KLOR-CON) 20 MEQ tablet Take 20 mEq by mouth 2 (two) times daily.      [provider]  ranitidine (ZANTAC) 150 MG capsule Take 150 mg by mouth as needed.      [provider]  traMADol (ULTRAM) 50 MG tablet Take 1 tablet (50 mg total) by mouth every 6 (six) hours as needed. 06/23/17   Luvenia Redden, PA-C    Family History Family History  Problem Relation Age of Onset  . Colon cancer Mother        55's dx  . Hypertension Mother   . Heart disease Mother   . Cancer Mother        ORAL  . Lung cancer Father   . Hypertension Sister   . Heart disease Sister   . Diabetes Sister   . Hypertension Maternal Grandmother   . Heart disease Maternal Grandmother   . Colon cancer Paternal Grandfather   . Stroke Brother   . Esophageal cancer Neg Hx   . Rectal cancer Neg Hx   . Stomach cancer Neg Hx     Social History Social History  Substance Use Topics  . Smoking status: Never Smoker  . Smokeless tobacco: Never Used  . Alcohol use No     Allergies   Patient has no known allergies.   Review of Systems Review of Systems  As noted above in history of present illness other systems reviewed and found to be negative.   Physical Exam Triage Vital Signs ED Triage Vitals  Enc Vitals Group     BP 06/23/17 1118 (!) 176/60     Pulse Rate 06/23/17 1118 62      Resp 06/23/17 1118 16     Temp 06/23/17 1118 97.6 F (36.4 C)     Temp Source 06/23/17 1118 Oral     SpO2 06/23/17 1118 98 %     Weight 06/23/17 1120 195 lb (88.5 kg)     Height 06/23/17 1120 5\' 8"  (1.727 m)     Head Circumference --      Peak Flow --      Pain Score 06/23/17 1121 10     Pain Loc --      Pain Edu? --      Excl. in [place]? --    No data found.   Updated Vital Signs BP (!) 176/60 (BP Location: Left Arm)   Pulse 62   Temp 97.6 F (36.4 C) (Oral)   Resp 16   Ht 5\' 8"  (1.727 m)   Wt 195 lb (88.5 kg)   LMP 10/04/2001   SpO2 98%   BMI 29.65 kg/m   Visual Acuity Right Eye Distance:   Left Eye Distance:   Bilateral Distance:    Right Eye Near:   Left Eye Near:    Bilateral Near:     Physical Exam  Constitutional: She appears well-developed and well-nourished.  Mild distress due to shoulder pain, protecting RUE and holding to side as she moves  HENT:  Head: Normocephalic and atraumatic.  Eyes: Pupils are equal, round, and reactive to light. EOM are normal.  Neck: Normal range of motion. Neck supple.  Cardiovascular: Normal rate and regular rhythm.   Pulmonary/Chest: Effort normal.  Musculoskeletal:  Right shoulder: She exhibits tenderness. She exhibits normal range of motion.       Cervical back: She exhibits bony tenderness.  Right shoulder with good range of motion. Tenderness to the shoulder with internal and external rotation of the humerus. Mild discomfort with abduction. Tenderness to palpation over the right scapula as well as upper thoracic vertebra spinal processes  Skin:  Patient with abrasions to the right scapular area as well as mid thoracic to left scapular area that are tender to palpation.     UC Treatments / Results  Labs (all labs ordered are listed, but only abnormal results are displayed) Labs Reviewed - No data to display  EKG  EKG Interpretation None       Radiology Dg Thoracic Spine 2 View  Result Date:  06/23/2017 CLINICAL DATA:  Fall in bathroom this morning. Thoracic back pain. Initial encounter. EXAM: THORACIC SPINE 2 VIEWS COMPARISON:  05/03/2012 FINDINGS: There is no evidence of thoracic spine fracture. Alignment is normal. No lytic or sclerotic bone lesions identified. Lumbar and cervical spine fusion hardware noted. Severe degenerative disc disease at L1-2. IMPRESSION: No acute findings. Electronically Signed   By: Earle Gell M.D.   On: 06/23/2017 12:44   Dg Scapula Right  Result Date: 06/23/2017 CLINICAL DATA:  Status post fall. EXAM: RIGHT SCAPULA - 2+ VIEWS COMPARISON:  None. FINDINGS: Nondisplaced fracture of the body of the right scapula. No other fracture. No glenohumeral fracture or dislocation. Mild arthropathy of the right acromioclavicular joint. IMPRESSION: Nondisplaced fracture of the body of the right scapula. Electronically Signed   By: Kathreen Devoid   On: 06/23/2017 12:39   Dg Shoulder Right  Result Date: 06/23/2017 CLINICAL DATA:  All EXAM: RIGHT SHOULDER - 2+ VIEW COMPARISON:  None. FINDINGS: There is no evidence of fracture or dislocation. Mild acromioclavicular and glenohumeral degenerative spurring noted. No other osseous abnormality identified. IMPRESSION: No acute findings. Mild acromioclavicular and glenohumeral degenerative spurring. Electronically Signed   By: Earle Gell M.D.   On: 06/23/2017 12:42    Procedures Procedures (including critical care time)  Medications Ordered in UC Medications - No data to display   Initial Impression / Assessment and Plan / UC Course  I have reviewed the triage vital signs and the nursing notes.  Pertinent labs & imaging results that were available during my care of the patient were reviewed by me and considered in my medical decision making (see chart for details).    Patient with full in the bathtub, striking her shoulder blade and back on the faucet inside of the bathtub. X-rays as above with a nondisplaced scapular  fracture.   Final Clinical Impressions(s) / UC Diagnoses   Final diagnoses:  Fall, initial encounter  Closed nondisplaced fracture of body of right scapula, initial encounter    New Prescriptions New Prescriptions   MELOXICAM (MOBIC) 7.5 MG TABLET    Take 1 tablet (7.5 mg total) by mouth daily as needed for pain.   TRAMADOL (ULTRAM) 50 MG TABLET    Take 1 tablet (50 mg total) by mouth every 6 (six) hours as needed.   We'll have patient placed in a sling. Will give her prescriptions for meloxicam and tramadol for her pain. Given instructions regarding ice rest and elevation. I will also have her follow with reported the penis given her history of psoriatic arthritis and any possible further complications from her autoimmune disease.. Patient understands plan and is agreeable.   Controlled Substance Prescriptions [place] Controlled Substance Registry  consulted? Yes, I have consulted the [place] Controlled Substances Registry for this patient, and feel the risk/benefit ratio today is favorable for proceeding with this prescription for a controlled substance.  Luvenia Redden, PA-C    Luvenia Redden, PA-C 06/23/17 1258    Luvenia Redden, PA-C 06/23/17 1304

## 2017-06-23 NOTE — ED Triage Notes (Signed)
Pt slipped and fell in bathtub this am. Now c/o back and right shoulder pain.

## 2017-06-23 NOTE — Discharge Instructions (Signed)
-  meloxicam: one tablet daily as needed for pain -Ultram: one tablet every 6 hours as needed for pain -rest, support with sling, ice: see attached -follow up with orthopedic surgery

## 2017-06-25 ENCOUNTER — Encounter: Payer: Self-pay | Admitting: Emergency Medicine

## 2017-06-25 ENCOUNTER — Ambulatory Visit
Admission: EM | Admit: 2017-06-25 | Discharge: 2017-06-25 | Disposition: A | Discharge status: Home / Self Care | Payer: Medicare Other

## 2017-06-25 DIAGNOSIS — S42101D Fracture of unspecified part of scapula, right shoulder, subsequent encounter for fracture with routine healing: Secondary | ICD-10-CM | POA: Diagnosis not present

## 2017-06-25 DIAGNOSIS — M546 Pain in thoracic spine: Secondary | ICD-10-CM | POA: Diagnosis not present

## 2017-06-25 DIAGNOSIS — R030 Elevated blood-pressure reading, without diagnosis of hypertension: Secondary | ICD-10-CM | POA: Diagnosis not present

## 2017-06-25 DIAGNOSIS — Z8679 Personal history of other diseases of the circulatory system: Secondary | ICD-10-CM | POA: Diagnosis not present

## 2017-06-25 NOTE — Discharge Instructions (Signed)
Continue to wear your shoulder sling as directed, take home meds as directed, take shoulder out of sling and move several times daily to avoid frozen shoulder. Follow up with your Orthopedist at [facility] next week-call for appt. Go to Er for new or worsening s/s(worsening pain, shortness of breath, unable to move extremity, etc).

## 2017-06-25 NOTE — ED Triage Notes (Signed)
Was seen on Thursday for fall in bath tub. Had fracture scapula. Now having severe back pain.

## 2017-06-25 NOTE — ED Provider Notes (Signed)
[facility]    CSN: [id number] Arrival date & time: 06/25/17  0920     History   Chief Complaint Chief Complaint  Patient presents with  . Back Pain    HPI MISTI TOWLE is a 71 y.o. female.   71 yr old caucasian female presents to  UC with cc of mid thoracic back pain. Pt has right shoulder sling on during exam. Pt is alert and oriented. Pt states she has had back pain since incident and wants xray of back. States taking meloxicam and tramadol with relief. Has Ortho in [facility], will follow up next week.    The history is provided by the patient. No language interpreter was used.    Past Medical History:  Diagnosis Date  . Candida esophagitis ([place])   . Carpal tunnel syndrome   . Diabetes mellitus    type II  . Dysplasia of cervix, low grade (CIN 1)   . Esophageal yeast infection ([place]) 2011  . Family history of malignant neoplasm of gastrointestinal tract   . Fatty liver   . Fibromyalgia   . Fundic gland polyps of stomach, benign   . Gastritis   . GERD (gastroesophageal reflux disease)   . Hemochromatosis   . Herpes   . Hypertension   . Hypothyroidism   . IBS (irritable bowel syndrome)   . Neuropathy   . Pelvic adhesions   . Peripheral neuropathy   . Psoriatic arthritis ([place])   . Sleep apnea    no CPAP used  . Unspecified gastritis and gastroduodenitis without mention of hemorrhage     Patient Active Problem List   Diagnosis Date Noted  . Acute bilateral thoracic back pain 06/25/2017  . Closed fracture of right scapula with routine healing 06/25/2017  . Hx of essential hypertension 06/25/2017  . Elevated blood pressure reading 06/25/2017  . Vaginal atrophy 05/14/2013  . Carpal tunnel syndrome   . Drug-induced constipation 06/15/2011  . GERD (gastroesophageal reflux disease) 06/15/2011  . Dysplasia of cervix, low grade (CIN 1)   . Pelvic adhesions   . Herpes   . Peripheral neuropathy   . Diabetes mellitus   . Hemochromatosis   .  CANDIDIASIS, ESOPHAGEAL 12/11/2010  . OBSTRUCTIVE SLEEP APNEA 12/07/2010  . HYPERGLYCEMIA 09/25/2010  . NAUSEA 09/08/2010  . ABDOMINAL PAIN, EPIGASTRIC 09/08/2010  . DIARRHEA 06/23/2010  . HEPATOMEGALY 06/23/2010  . 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 PERIPHERAL NEUROPATHY 02/22/2008  . HYPERTENSION 02/22/2008  . GERD 02/22/2008  . FATTY LIVER DISEASE 02/22/2008  . RENAL CYST, LEFT 02/22/2008  . INTERSTITIAL CYSTITIS 02/22/2008  . UTI'S, RECURRENT 02/22/2008  . DEGENERATIVE JOINT DISEASE 02/22/2008  . HERNIATED LUMBOSACRAL DISC 02/22/2008  . [place] DISEASE, CERVICAL 02/22/2008  . FIBROMYALGIA 02/22/2008  . SLEEP APNEA 02/22/2008  . EARLY SATIETY 02/22/2008  . 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 GASTRITIS 01/24/2008  . IRRITABLE BOWEL SYNDROME 01/24/2008  . DEPRESSION 01/19/2008  . GASTROPARESIS 01/19/2008  . MENINGIOMA 05/22/1996    Past Surgical History:  Procedure Laterality Date  . APPENDECTOMY     dx lap   . BACK SURGERY     L1-L5  . CERVICAL FUSION     c5-c7  . DL LEFT SALPINGECTOMY LYSIS OF ADHESIONS, RIGHT BTL    . FOOT SURGERY    . lap chloecystectomy    . PELVIC LAPAROSCOPY    . TUBAL LIGATION     RIGHT    OB History    Gravida Para Term Preterm AB Living   0  SAB TAB Ectopic Multiple Live Births                   Home Medications    Prior to Admission medications   Medication Sig Start Date End Date Taking? Authorizing Provider  aspirin 81 MG tablet Take 81 mg by mouth daily.   Yes [provider]  atorvastatin (LIPITOR) 10 MG tablet Take 10 mg by mouth daily.   Yes [provider]  Calcium Carbonate-Vitamin D (CALCIUM + D PO) Take 1 tablet by mouth daily.    Yes [provider]  DULoxetine (CYMBALTA) 30 MG capsule Take 30 mg by mouth 2 (two) times daily.   Yes [provider]  levothyroxine (SYNTHROID, LEVOTHROID) 25 MCG tablet Take 25 mcg by mouth daily.     Yes [provider]  lisinopril  (PRINIVIL,ZESTRIL) 20 MG tablet Take 20 mg by mouth daily.   Yes [provider]  Melatonin 1 MG TABS Take by mouth.   Yes [provider]  metFORMIN (GLUCOPHAGE) 500 MG tablet Take by mouth 2 (two) times daily with a meal.   Yes [provider]  metoprolol tartrate (LOPRESSOR) 50 MG tablet Take 50 mg by mouth 2 (two) times daily.   Yes [provider]  Multiple Vitamin (MULTIVITAMIN) tablet Take 1 tablet by mouth daily.     Yes [provider]  nitroGLYCERIN (NITROSTAT) 0.4 MG SL tablet Place 0.4 mg under the tongue every 5 (five) minutes as needed for chest pain.   Yes [provider]  NONFORMULARY OR COMPOUNDED ITEM Estradiol 0.02 % 78ml prefilled applicator Sig: apply twice a week 06/03/15  Yes Terrance Mass, MD  omeprazole (PRILOSEC) 40 MG capsule Take 1 capsule (40 mg total) by mouth daily. 07/14/12  Yes Sable Feil, MD  ranitidine (ZANTAC) 150 MG capsule Take 150 mg by mouth as needed.     Yes [provider]  spironolactone (ALDACTONE) 25 MG tablet Take 25 mg by mouth daily.   Yes [provider]  traMADol (ULTRAM) 50 MG tablet Take by mouth every 6 (six) hours as needed.   Yes [provider]  traMADol (ULTRAM) 50 MG tablet Take 1 tablet (50 mg total) by mouth every 6 (six) hours as needed. 06/23/17  Yes Luvenia Redden, PA-C  cetirizine (ZYRTEC) 10 MG tablet Take 10 mg by mouth daily.    [provider]  folic acid (FOLVITE) 1 MG tablet Take 1 mg by mouth daily.    [provider]  furosemide (LASIX) 40 MG tablet Take 40 mg by mouth 2 (two) times daily.      [provider]  glimepiride (AMARYL) 4 MG tablet Take 4 mg by mouth daily before breakfast.      [provider]  hydrochlorothiazide (HYDRODIURIL) 25 MG tablet Take 25 mg by mouth daily.    [provider]  InFLIXimab (REMICADE IV) Inject into the vein.    [provider]  lamoTRIgine  (LAMICTAL) 200 MG tablet Take 200 mg by mouth daily.      [provider]  meloxicam (MOBIC) 7.5 MG tablet Take 1 tablet (7.5 mg total) by mouth daily as needed for pain. 06/23/17   Luvenia Redden, PA-C  ondansetron (ZOFRAN) 8 MG tablet Take by mouth every 8 (eight) hours as needed for nausea or vomiting.    [provider]  potassium chloride SA (K-DUR,KLOR-CON) 20 MEQ tablet Take 20 mEq by mouth 2 (two) times  daily.      [provider]  predniSONE (DELTASONE) 2.5 MG tablet Take 2.5 mg by mouth daily with breakfast.    [provider]  pregabalin (LYRICA) 100 MG capsule Take 150 mg by mouth 2 (two) times daily.     [provider]  valACYclovir (VALTREX) 500 MG tablet TAKE 1 TABLET DAILY 08/25/15   Terrance Mass, MD    Family History Family History  Problem Relation Age of Onset  . Colon cancer Mother        12's dx  . Hypertension Mother   . Heart disease Mother   . Cancer Mother        ORAL  . Lung cancer Father   . Hypertension Sister   . Heart disease Sister   . Diabetes Sister   . Hypertension Maternal Grandmother   . Heart disease Maternal Grandmother   . Colon cancer Paternal Grandfather   . Stroke Brother   . Esophageal cancer Neg Hx   . Rectal cancer Neg Hx   . Stomach cancer Neg Hx     Social History Social History  Substance Use Topics  . Smoking status: Never Smoker  . Smokeless tobacco: Never Used  . Alcohol use No     Allergies   Patient has no known allergies.   Review of Systems Review of Systems  Musculoskeletal: Positive for arthralgias, back pain and myalgias.  All other systems reviewed and are negative.    Physical Exam Triage Vital Signs ED Triage Vitals  Enc Vitals Group     BP 06/25/17 0929 (!) 181/67     Pulse Rate 06/25/17 0929 62     Resp 06/25/17 0929 16     Temp 06/25/17 0929 98.5 F (36.9 C)     Temp Source 06/25/17 0929 Oral     SpO2 06/25/17 0929 95 %     Weight 06/25/17  0931 195 lb (88.5 kg)     Height 06/25/17 0931 5\' 8"  (1.727 m)     Head Circumference --      Peak Flow --      Pain Score 06/25/17 0931 8     Pain Loc --      Pain Edu? --      Excl. in [place]? --    No data found.   Updated Vital Signs BP (!) 181/67 (BP Location: Left Arm)   Pulse 62   Temp 98.5 F (36.9 C) (Oral)   Resp 16   Ht 5\' 8"  (1.727 m)   Wt 195 lb (88.5 kg)   LMP 10/04/2001   SpO2 95%   BMI 29.65 kg/m   Visual Acuity Right Eye Distance:   Left Eye Distance:   Bilateral Distance:    Right Eye Near:   Left Eye Near:    Bilateral Near:     Physical Exam  Constitutional: She is oriented to person, place, and time. Vital signs are normal. She appears well-developed and well-nourished. She is active and cooperative.  Non-toxic appearance. She does not have a sickly appearance. She does not appear ill. She appears distressed.  HENT:  Right Ear: Hearing normal.  Left Ear: Hearing normal.  Nose: Nose normal.  Mouth/Throat: Uvula is midline.  Eyes: Pupils are equal, round, and reactive to light.  Cardiovascular: Normal rate, regular rhythm, intact distal pulses and normal pulses.   Pulses:      Dorsalis pedis pulses are 2+ on the right side, and 2+ on the left side.  Pulmonary/Chest: Effort normal.  Musculoskeletal:       Lumbar back: She exhibits tenderness, pain and spasm. She exhibits normal range of motion, no bony tenderness, no swelling, no edema, no deformity, no laceration and normal pulse.  -SLE bilateral, dorsiflex/ext equal bilateral, MAEW, no foot drop, NV intact  Neurological: She is alert and oriented to person, place, and time. She has normal strength and normal reflexes. No cranial nerve deficit or sensory deficit. She displays a negative Romberg sign. GCS eye subscore is 4. GCS verbal subscore is 5. GCS motor subscore is 6.  Psychiatric: She has a normal mood and affect. Her speech is normal and behavior is normal.  Nursing note and vitals  reviewed.    UC Treatments / Results  Labs (all labs ordered are listed, but only abnormal results are displayed) Labs Reviewed - No data to display  EKG  EKG Interpretation None       Radiology No results found.  Procedures Procedures (including critical care time)  Medications Ordered in UC Medications - No data to display   Initial Impression / Assessment and Plan / UC Course  I have reviewed the triage vital signs and the nursing notes.  Pertinent labs & imaging results that were available during my care of the patient were reviewed by me and considered in my medical decision making (see chart for details).   Copies of recent xrays given to pt, pt verbalized understanding to this provider.   Continue to wear your shoulder sling as directed, take home meds as directed, take shoulder out of sling and move several times daily to avoid frozen shoulder. Follow up with your Orthopedist at [facility] next week-call for appt. Go to Er for new or worsening s/s(worsening pain, shortness of breath, unable to move extremity, etc).   Final Clinical Impressions(s) / UC Diagnoses   Final diagnoses:  Acute bilateral thoracic back pain  Closed fracture of right scapula with routine healing, unspecified part of scapula, subsequent encounter  Hx of essential hypertension  Elevated blood pressure reading    New Prescriptions Discharge Medication List as of 06/25/2017 10:35 AM          Tori Milks, NP 10/12/30 1741

## 2017-07-03 ENCOUNTER — Ambulatory Visit: Payer: Medicare Other

## 2017-07-03 ENCOUNTER — Encounter: Payer: Self-pay | Admitting: Gynecology

## 2017-07-03 ENCOUNTER — Ambulatory Visit
Admission: EM | Admit: 2017-07-03 | Discharge: 2017-07-03 | Disposition: A | Discharge status: Home / Self Care | Payer: Medicare Other | Attending: Family Medicine | Admitting: Family Medicine

## 2017-07-03 DIAGNOSIS — F329 Major depressive disorder, single episode, unspecified: Secondary | ICD-10-CM | POA: Insufficient documentation

## 2017-07-03 DIAGNOSIS — W1830XA Fall on same level, unspecified, initial encounter: Secondary | ICD-10-CM | POA: Diagnosis not present

## 2017-07-03 DIAGNOSIS — Z7984 Long term (current) use of oral hypoglycemic drugs: Secondary | ICD-10-CM | POA: Diagnosis not present

## 2017-07-03 DIAGNOSIS — E039 Hypothyroidism, unspecified: Secondary | ICD-10-CM | POA: Diagnosis not present

## 2017-07-03 DIAGNOSIS — Z79899 Other long term (current) drug therapy: Secondary | ICD-10-CM | POA: Diagnosis not present

## 2017-07-03 DIAGNOSIS — I10 Essential (primary) hypertension: Secondary | ICD-10-CM | POA: Insufficient documentation

## 2017-07-03 DIAGNOSIS — G4733 Obstructive sleep apnea (adult) (pediatric): Secondary | ICD-10-CM | POA: Diagnosis not present

## 2017-07-03 DIAGNOSIS — E1165 Type 2 diabetes mellitus with hyperglycemia: Secondary | ICD-10-CM | POA: Diagnosis not present

## 2017-07-03 DIAGNOSIS — M546 Pain in thoracic spine: Secondary | ICD-10-CM | POA: Insufficient documentation

## 2017-07-03 DIAGNOSIS — K580 Irritable bowel syndrome with diarrhea: Secondary | ICD-10-CM | POA: Diagnosis not present

## 2017-07-03 DIAGNOSIS — S2231XA Fracture of one rib, right side, initial encounter for closed fracture: Secondary | ICD-10-CM | POA: Diagnosis not present

## 2017-07-03 DIAGNOSIS — M797 Fibromyalgia: Secondary | ICD-10-CM | POA: Diagnosis not present

## 2017-07-03 DIAGNOSIS — G5603 Carpal tunnel syndrome, bilateral upper limbs: Secondary | ICD-10-CM | POA: Diagnosis not present

## 2017-07-03 DIAGNOSIS — K219 Gastro-esophageal reflux disease without esophagitis: Secondary | ICD-10-CM | POA: Diagnosis not present

## 2017-07-03 DIAGNOSIS — Z7952 Long term (current) use of systemic steroids: Secondary | ICD-10-CM | POA: Diagnosis not present

## 2017-07-03 DIAGNOSIS — Z7982 Long term (current) use of aspirin: Secondary | ICD-10-CM | POA: Insufficient documentation

## 2017-07-03 DIAGNOSIS — T1490XA Injury, unspecified, initial encounter: Secondary | ICD-10-CM | POA: Diagnosis present

## 2017-07-03 NOTE — ED Provider Notes (Signed)
[facility]    CSN: [id number] Arrival date & time: 07/03/17  2505     History   Chief Complaint Chief Complaint  Patient presents with  . Rib Injury    HPI [name] is a 71 y.o. female.   Patient is a 71 year old white female who fell about 10 days ago. She fell on the child bending the faucet. With the fall she actually had a right scapula fracture shown on x-ray. Since then she's been wearing a sling she seen an orthopedic and her PCP. She's had some discomfort over the right ribs as well but was told by PCP lessened. Her breathing is not too necessary be concerned about. In the last 4872 hours her breathing has gotten worse the pain under the right ribs and got worse as well. She comes in to be seen and evaluated. Past medical history multiple medical problems including hematochromatosis, fatty liver, fibromyalgia GERD hypertension and a host of other medical problems. CC earlier notes she's had appendectomy back surgery surgical fusion salpingectomy foot surgery pelvic graft endoscopic and tubal ligation in the past. This is her third visit here and about the fifth visit to physician's office for this fall that happened 10 days ago. No known drug allergies and she never smoked   The history is provided by the patient. No language interpreter was used.    Past Medical History:  Diagnosis Date  . Candida esophagitis ([place])   . Carpal tunnel syndrome   . Diabetes mellitus    type II  . Dysplasia of cervix, low grade (CIN 1)   . Esophageal yeast infection ([place]) 2011  . Family history of malignant neoplasm of gastrointestinal tract   . Fatty liver   . Fibromyalgia   . Fundic gland polyps of stomach, benign   . Gastritis   . GERD (gastroesophageal reflux disease)   . Hemochromatosis   . Herpes   . Hypertension   . Hypothyroidism   . IBS (irritable bowel syndrome)   . Neuropathy   . Pelvic adhesions   . Peripheral neuropathy   . Psoriatic arthritis ([place])    . Sleep apnea    no CPAP used  . Unspecified gastritis and gastroduodenitis without mention of hemorrhage     Patient Active Problem List   Diagnosis Date Noted  . Acute bilateral thoracic back pain 06/25/2017  . Closed fracture of right scapula with routine healing 06/25/2017  . Hx of essential hypertension 06/25/2017  . Elevated blood pressure reading 06/25/2017  . Vaginal atrophy 05/14/2013  . Carpal tunnel syndrome   . Drug-induced constipation 06/15/2011  . GERD (gastroesophageal reflux disease) 06/15/2011  . Dysplasia of cervix, low grade (CIN 1)   . Pelvic adhesions   . Herpes   . Peripheral neuropathy   . Diabetes mellitus   . Hemochromatosis   . CANDIDIASIS, ESOPHAGEAL 12/11/2010  . OBSTRUCTIVE SLEEP APNEA 12/07/2010  . HYPERGLYCEMIA 09/25/2010  . NAUSEA 09/08/2010  . ABDOMINAL PAIN, EPIGASTRIC 09/08/2010  . DIARRHEA 06/23/2010  . HEPATOMEGALY 06/23/2010  . 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 PERIPHERAL NEUROPATHY 02/22/2008  . HYPERTENSION 02/22/2008  . GERD 02/22/2008  . FATTY LIVER DISEASE 02/22/2008  . RENAL CYST, LEFT 02/22/2008  . INTERSTITIAL CYSTITIS 02/22/2008  . UTI'S, RECURRENT 02/22/2008  . DEGENERATIVE JOINT DISEASE 02/22/2008  . HERNIATED LUMBOSACRAL DISC 02/22/2008  . [place] DISEASE, CERVICAL 02/22/2008  . FIBROMYALGIA 02/22/2008  . SLEEP APNEA 02/22/2008  . EARLY SATIETY 02/22/2008  . 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 GASTRITIS 01/24/2008  .  IRRITABLE BOWEL SYNDROME 01/24/2008  . DEPRESSION 01/19/2008  . GASTROPARESIS 01/19/2008  . MENINGIOMA 05/22/1996    Past Surgical History:  Procedure Laterality Date  . APPENDECTOMY     dx lap   . BACK SURGERY     L1-L5  . CERVICAL FUSION     c5-c7  . DL LEFT SALPINGECTOMY LYSIS OF ADHESIONS, RIGHT BTL    . FOOT SURGERY    . lap chloecystectomy    . PELVIC LAPAROSCOPY    . TUBAL LIGATION     RIGHT    OB History    Gravida Para Term Preterm AB Living   0             SAB TAB Ectopic Multiple Live  Births                   Home Medications    Prior to Admission medications   Medication Sig Start Date End Date Taking? Authorizing Provider  aspirin 81 MG tablet Take 81 mg by mouth daily.   Yes [provider]  atorvastatin (LIPITOR) 10 MG tablet Take 10 mg by mouth daily.   Yes [provider]  cetirizine (ZYRTEC) 10 MG tablet Take 10 mg by mouth daily.   Yes [provider]  DULoxetine (CYMBALTA) 30 MG capsule Take 30 mg by mouth 2 (two) times daily.   Yes [provider]  folic acid (FOLVITE) 1 MG tablet Take 1 mg by mouth daily.   Yes [provider]  furosemide (LASIX) 40 MG tablet Take 40 mg by mouth 2 (two) times daily.     Yes [provider]  glimepiride (AMARYL) 4 MG tablet Take 4 mg by mouth daily before breakfast.     Yes [provider]  hydrochlorothiazide (HYDRODIURIL) 25 MG tablet Take 25 mg by mouth daily.   Yes [provider]  lamoTRIgine (LAMICTAL) 200 MG tablet Take 200 mg by mouth daily.     Yes [provider]  levothyroxine (SYNTHROID, LEVOTHROID) 25 MCG tablet Take 25 mcg by mouth daily.     Yes [provider]  lisinopril (PRINIVIL,ZESTRIL) 20 MG tablet Take 20 mg by mouth daily.   Yes [provider]  Melatonin 1 MG TABS Take by mouth.   Yes [provider]  meloxicam (MOBIC) 7.5 MG tablet Take 1 tablet (7.5 mg total) by mouth daily as needed for pain. 06/23/17  Yes Luvenia Redden, PA-C  metFORMIN (GLUCOPHAGE) 500 MG tablet Take by mouth 2 (two) times daily with a meal.   Yes [provider]  metoprolol tartrate (LOPRESSOR) 50 MG tablet Take 50 mg by mouth 2 (two) times daily.   Yes [provider]  omeprazole (PRILOSEC) 40 MG capsule Take 1 capsule (40 mg total) by mouth daily. 07/14/12  Yes Sable Feil, MD  ondansetron (ZOFRAN) 8 MG tablet Take by mouth every 8 (eight) hours as needed for nausea or vomiting.   Yes [provider]  potassium chloride SA (K-DUR,KLOR-CON) 20 MEQ tablet Take 20 mEq by mouth 2 (two) times daily.     Yes [provider]  predniSONE (DELTASONE) 2.5 MG tablet Take 2.5 mg by mouth daily with breakfast.   Yes [provider]  pregabalin (LYRICA) 100 MG capsule Take 150 mg by mouth 2 (two) times daily.    Yes [provider]  ranitidine (ZANTAC) 150 MG capsule Take 150 mg by mouth as needed.     Yes [provider]  spironolactone (ALDACTONE) 25 MG tablet Take 25 mg by mouth daily.   Yes [provider]  traMADol (ULTRAM) 50 MG tablet Take 1 tablet (50 mg total) by mouth every 6 (six) hours as needed. 06/23/17  Yes Luvenia Redden, PA-C  valACYclovir (VALTREX) 500 MG tablet TAKE 1 TABLET DAILY 08/25/15  Yes Terrance Mass, MD  Calcium Carbonate-Vitamin D (CALCIUM + D PO) Take 1 tablet by mouth daily.     [provider]  InFLIXimab (REMICADE IV) Inject into the vein.    [provider]  Multiple Vitamin (MULTIVITAMIN) tablet Take 1 tablet by mouth daily.      [provider]  nitroGLYCERIN (NITROSTAT) 0.4 MG SL tablet Place 0.4 mg under the tongue every 5 (five) minutes as needed for chest pain.    [provider]  NONFORMULARY OR COMPOUNDED ITEM Estradiol 0.02 % 43ml prefilled applicator Sig: apply twice a week 06/03/15   Terrance Mass, MD  traMADol (ULTRAM) 50 MG tablet Take by mouth every 6 (six) hours as needed.    [provider]    Family History Family History  Problem Relation Age of Onset  . Colon cancer Mother        23's dx  . Hypertension Mother   . Heart disease Mother   . Cancer Mother        ORAL  . Lung cancer Father   . Hypertension Sister   . Heart disease Sister   . Diabetes Sister   . Hypertension Maternal Grandmother   . Heart disease Maternal Grandmother   . Colon cancer Paternal Grandfather   . Stroke Brother   . Esophageal cancer Neg Hx   . Rectal  cancer Neg Hx   . Stomach cancer Neg Hx     Social History Social History  Substance Use Topics  . Smoking status: Never Smoker  . Smokeless tobacco: Never Used  . Alcohol use No     Allergies   Patient has no known allergies.   Review of Systems Review of Systems  Musculoskeletal: Positive for arthralgias, back pain, myalgias and neck pain.     Physical Exam Triage Vital Signs ED Triage Vitals  Enc Vitals Group     BP 07/03/17 1023 (!) 167/57     Pulse Rate 07/03/17 1023 72     Resp 07/03/17 1023 16     Temp 07/03/17 1023 98.7 F (37.1 C)     Temp Source 07/03/17 1023 Oral     SpO2 07/03/17 1023 98 %     Weight --      Height --      Head Circumference --      Peak Flow --      Pain Score 07/03/17 1018 10     Pain Loc --      Pain Edu? --      Excl. in [place]? --    No data found.   Updated Vital Signs BP (!) 167/57 (BP Location: Left Arm)   Pulse 72   Temp 98.7 F (37.1 C) (Oral)   Resp 16   LMP 10/04/2001   SpO2 98%   Visual Acuity Right Eye Distance:   Left Eye Distance:   Bilateral Distance:    Right Eye Near:   Left Eye Near:    Bilateral Near:     Physical Exam  Constitutional: She is oriented to person, place, and time. She appears well-developed and well-nourished.  HENT:  Head: Normocephalic and atraumatic.  Eyes: Pupils are equal, round, and reactive to light.  Neck: Normal range of motion. Neck supple.  Cardiovascular: Normal rate and regular rhythm.   Pulmonary/Chest: Effort normal and breath sounds normal.  Abdominal: Soft.  Musculoskeletal:       Right shoulder: She exhibits decreased range of motion, tenderness, bony tenderness and swelling.       Arms: Patient is wearing a sling for the right arm she has tenderness over the right scapula which is fractured she has marked ecchymosis over her right back inferior to the right scapula where the bleeding has occurred and this tenderness over the right posterior ribs especially the  right anterior ribs as well lungs were clear cardiac S1-S2  Neurological: She is alert and oriented to person, place, and time.  Skin: Skin is warm. There is erythema.  Psychiatric: She has a normal mood and affect.  Vitals reviewed.    UC Treatments / Results  Labs (all labs ordered are listed, but only abnormal results are displayed) Labs Reviewed - No data to display  EKG  EKG Interpretation None       Radiology Dg Ribs Unilateral W/chest Right  Result Date: 07/03/2017 CLINICAL DATA:  Post fall 10 days ago with fractured right scapula now with worsening right rib pain. EXAM: RIGHT RIBS AND CHEST - 3+ VIEW COMPARISON:  Right scapular radiographs - 06/23/2017; right shoulder radiographs - 06/23/2017; thoracic spine radiographs - 06/23/2017 ; chest radiograph - 06/15/2011 FINDINGS: Grossly unchanged cardiac silhouette and mediastinal contours. Atherosclerotic plaque within the thoracic aorta. Minimal left basilar linear heterogeneous opacities favored to represent atelectasis, unchanged. No new focal airspace opacities. No pleural effusion or pneumothorax. No evidence of edema. Minimally displaced fracture involving the posterolateral aspect of the right 8th rib. Regional soft tissues appear normal. No radiopaque foreign body. Questioned right scapular fracture not well demonstrated on the present examination. Post apparent L2-S1 paraspinal fusion and intervertebral disc space replacement and lower cervical ACDF, incompletely evaluated. Post cholecystectomy. IMPRESSION: 1. Minimally displaced fracture of the posterolateral aspect of the right eighth rib. No associated radiopaque foreign body or acute cardiopulmonary disease. 2.  Aortic Atherosclerosis (ICD10-I70.0). Electronically Signed   By: Sandi Mariscal M.D.   On: 07/03/2017 11:36    Procedures Procedures (including critical care time)  Medications Ordered in UC Medications - No data to display   Initial Impression / Assessment and  Plan / UC Course  I have reviewed the triage vital signs and the nursing notes.  Pertinent labs & imaging results that were available during my care of the patient were reviewed by me and considered in my medical decision making (see chart for details).     Despite multiple x-rays and earlier visits here apparently right ribs were not done will x-ray right ribs and go from there. She seemed to have some difficulty in splinting offer injection of Toradol for pain but she has tramadol home and she says contact the tramadol and other month since that she has sounds echo as a muscle relaxer   Patient has have a fracture of the right eighth rib fortunately minimally displaced Final Clinical Impressions(s) / UC Diagnoses   Final diagnoses:  Closed fracture of one rib of right side, initial encounter    New Prescriptions New Prescriptions   No medications on file    Note: This dictation was prepared with Dragon dictation along with smaller phrase technology. Any transcriptional errors that result from this process are unintentional.  Controlled Substance Prescriptions [place] Controlled Substance Registry consulted? Not Applicable   Frederich Cha, MD 07/03/17 1919

## 2017-07-03 NOTE — ED Triage Notes (Signed)
Patient was seen on 06/25/17 for a fall and injury to her right side and a right scapula fracture. Patient c/o now with right rib pain / soreness when taking deep breath.

## 2017-12-27 ENCOUNTER — Other Ambulatory Visit: Payer: Self-pay

## 2017-12-27 ENCOUNTER — Emergency Department: Payer: Medicare Other

## 2017-12-27 ENCOUNTER — Encounter: Payer: Self-pay | Admitting: Emergency Medicine

## 2017-12-27 ENCOUNTER — Emergency Department
Admission: EM | Admit: 2017-12-27 | Discharge: 2017-12-27 | Disposition: A | Discharge status: Home / Self Care | Payer: Medicare Other | Attending: Emergency Medicine | Admitting: Emergency Medicine

## 2017-12-27 DIAGNOSIS — Z7982 Long term (current) use of aspirin: Secondary | ICD-10-CM | POA: Diagnosis not present

## 2017-12-27 DIAGNOSIS — E039 Hypothyroidism, unspecified: Secondary | ICD-10-CM | POA: Diagnosis not present

## 2017-12-27 DIAGNOSIS — W06XXXA Fall from bed, initial encounter: Secondary | ICD-10-CM | POA: Diagnosis not present

## 2017-12-27 DIAGNOSIS — E119 Type 2 diabetes mellitus without complications: Secondary | ICD-10-CM | POA: Diagnosis not present

## 2017-12-27 DIAGNOSIS — Z7984 Long term (current) use of oral hypoglycemic drugs: Secondary | ICD-10-CM | POA: Diagnosis not present

## 2017-12-27 DIAGNOSIS — W19XXXA Unspecified fall, initial encounter: Secondary | ICD-10-CM

## 2017-12-27 DIAGNOSIS — F329 Major depressive disorder, single episode, unspecified: Secondary | ICD-10-CM | POA: Diagnosis not present

## 2017-12-27 DIAGNOSIS — R0781 Pleurodynia: Secondary | ICD-10-CM

## 2017-12-27 DIAGNOSIS — I10 Essential (primary) hypertension: Secondary | ICD-10-CM | POA: Diagnosis not present

## 2017-12-27 DIAGNOSIS — Z79899 Other long term (current) drug therapy: Secondary | ICD-10-CM | POA: Diagnosis not present

## 2017-12-27 DIAGNOSIS — R0789 Other chest pain: Secondary | ICD-10-CM | POA: Diagnosis present

## 2017-12-27 DIAGNOSIS — M25512 Pain in left shoulder: Secondary | ICD-10-CM | POA: Insufficient documentation

## 2017-12-27 DIAGNOSIS — Y92009 Unspecified place in unspecified non-institutional (private) residence as the place of occurrence of the external cause: Secondary | ICD-10-CM

## 2017-12-27 LAB — URINALYSIS, COMPLETE (UACMP) WITH MICROSCOPIC
Bacteria, UA: NONE SEEN
Bilirubin Urine: NEGATIVE
GLUCOSE, UA: NEGATIVE mg/dL
Ketones, ur: NEGATIVE mg/dL
Leukocytes, UA: NEGATIVE
Nitrite: NEGATIVE
PH: 5 (ref 5.0–8.0)
Protein, ur: 30 mg/dL — AB
Specific Gravity, Urine: 1.014 (ref 1.005–1.030)

## 2017-12-27 MED ORDER — TRAMADOL HCL 50 MG PO TABS
50.0000 mg | ORAL_TABLET | Freq: Once | ORAL | Status: AC
  Administered 2017-12-27: 50 mg via ORAL
  Filled 2017-12-27: qty 1

## 2017-12-27 MED ORDER — TRAMADOL HCL 50 MG PO TABS: 50.0000 mg | ORAL_TABLET | Freq: Two times a day (BID) | ORAL | 0 refills | Status: DC

## 2017-12-27 NOTE — ED Notes (Signed)
Pt placed in wheelchair to go to the bathroom. Pt needs assistance getting out of bed but once up she is able to ambulate slowly and bear weight. Pt placed on toilet and told to pull the red cord.

## 2017-12-27 NOTE — ED Notes (Signed)
Pt given specimen cup and walker to ambulate to bathroom. Pt has gone to xray at this time.

## 2017-12-27 NOTE — ED Notes (Signed)
Pt put in wheelchair and taken to [place] with friend. VSS. NAD. Discharge instructions, RX and follow up discussed. All questions answered.

## 2017-12-27 NOTE — ED Provider Notes (Signed)
[facility] Emergency Department Provider Note ____________________________________________  Time seen: 1109  I have reviewed the triage vital signs and the nursing notes.  HISTORY  Chief Complaint  Fall  HPI [name] is a 72 y.o. female presents to the ED via EMS, from home.  Patient apparently sustained a mechanical fall last night, after she went to bed.  She admits to reaching across her night stand for drink of water, when she accidentally knocked the water off.  She describes then standing to clean up, and slipping between the bed and the nightstand.  She did not call her sister who lives nearby noted she called EMS immediately.  Patient apparently waited until this morning to call EMS for assistance.  He found the patient lying between the bed and the nightstand.  She reported pain to her left ribs and her left shoulder.  She denies any head injury, nausea, vomiting, or dizziness.  She also denies any preceding syncope or weakness.  Past Medical History:  Diagnosis Date  . Candida esophagitis ([place])   . Carpal tunnel syndrome   . Diabetes mellitus    type II  . Dysplasia of cervix, low grade (CIN 1)   . Esophageal yeast infection ([place]) 2011  . Family history of malignant neoplasm of gastrointestinal tract   . Fatty liver   . Fibromyalgia   . Fundic gland polyps of stomach, benign   . Gastritis   . GERD (gastroesophageal reflux disease)   . Hemochromatosis   . Herpes   . Hypertension   . Hypothyroidism   . IBS (irritable bowel syndrome)   . Neuropathy   . Pelvic adhesions   . Peripheral neuropathy   . Psoriatic arthritis ([place])   . Sleep apnea    no CPAP used  . Unspecified gastritis and gastroduodenitis without mention of hemorrhage     Patient Active Problem List   Diagnosis Date Noted  . Acute bilateral thoracic back pain 06/25/2017  . Closed fracture of right scapula with routine healing 06/25/2017  . Hx of essential hypertension  06/25/2017  . Elevated blood pressure reading 06/25/2017  . Vaginal atrophy 05/14/2013  . Carpal tunnel syndrome   . Drug-induced constipation 06/15/2011  . GERD (gastroesophageal reflux disease) 06/15/2011  . Dysplasia of cervix, low grade (CIN 1)   . Pelvic adhesions   . Herpes   . Peripheral neuropathy   . Diabetes mellitus   . Hemochromatosis   . CANDIDIASIS, ESOPHAGEAL 12/11/2010  . OBSTRUCTIVE SLEEP APNEA 12/07/2010  . HYPERGLYCEMIA 09/25/2010  . NAUSEA 09/08/2010  . ABDOMINAL PAIN, EPIGASTRIC 09/08/2010  . DIARRHEA 06/23/2010  . HEPATOMEGALY 06/23/2010  . 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 PERIPHERAL NEUROPATHY 02/22/2008  . HYPERTENSION 02/22/2008  . GERD 02/22/2008  . FATTY LIVER DISEASE 02/22/2008  . RENAL CYST, LEFT 02/22/2008  . INTERSTITIAL CYSTITIS 02/22/2008  . UTI'S, RECURRENT 02/22/2008  . DEGENERATIVE JOINT DISEASE 02/22/2008  . HERNIATED LUMBOSACRAL DISC 02/22/2008  . [place] DISEASE, CERVICAL 02/22/2008  . FIBROMYALGIA 02/22/2008  . SLEEP APNEA 02/22/2008  . EARLY SATIETY 02/22/2008  . 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 GASTRITIS 01/24/2008  . IRRITABLE BOWEL SYNDROME 01/24/2008  . DEPRESSION 01/19/2008  . GASTROPARESIS 01/19/2008  . MENINGIOMA 05/22/1996    Past Surgical History:  Procedure Laterality Date  . APPENDECTOMY     dx lap   . BACK SURGERY     L1-L5  . CERVICAL FUSION     c5-c7  . DL LEFT SALPINGECTOMY LYSIS OF ADHESIONS, RIGHT BTL    .  FOOT SURGERY    . lap chloecystectomy    . PELVIC LAPAROSCOPY    . TUBAL LIGATION     RIGHT    Prior to Admission medications   Medication Sig Start Date End Date Taking? Authorizing Provider  amLODipine (NORVASC) 5 MG tablet Take 5 mg by mouth daily.   Yes [provider]  azelastine (ASTELIN) 0.1 % nasal spray Place 1 spray into both nostrils 2 (two) times daily. Use in each nostril as directed   Yes [provider]  vitamin B-12 (CYANOCOBALAMIN) 1000 MCG tablet Take 1,000 mcg by mouth  daily.   Yes [provider]  aspirin 81 MG tablet Take 81 mg by mouth daily.    [provider]  atorvastatin (LIPITOR) 10 MG tablet Take 10 mg by mouth daily.    [provider]  Calcium Carbonate-Vitamin D (CALCIUM + D PO) Take 1 tablet by mouth daily.     [provider]  cetirizine (ZYRTEC) 10 MG tablet Take 10 mg by mouth daily.    [provider]  DULoxetine (CYMBALTA) 30 MG capsule Take 30 mg by mouth 2 (two) times daily.    [provider]  folic acid (FOLVITE) 1 MG tablet Take 1 mg by mouth daily.    [provider]  furosemide (LASIX) 40 MG tablet Take 40 mg by mouth 2 (two) times daily.      [provider]  glimepiride (AMARYL) 4 MG tablet Take 4 mg by mouth daily before breakfast.      [provider]  hydrochlorothiazide (HYDRODIURIL) 25 MG tablet Take 25 mg by mouth daily.    [provider]  InFLIXimab (REMICADE IV) Inject into the vein.    [provider]  lamoTRIgine (LAMICTAL) 200 MG tablet Take 200 mg by mouth daily.      [provider]  levothyroxine (SYNTHROID, LEVOTHROID) 25 MCG tablet Take 25 mcg by mouth daily.      [provider]  lisinopril (PRINIVIL,ZESTRIL) 20 MG tablet Take 20 mg by mouth daily.    [provider]  Melatonin 1 MG TABS Take by mouth.    [provider]  meloxicam (MOBIC) 7.5 MG tablet Take 1 tablet (7.5 mg total) by mouth daily as needed for pain. 06/23/17   Luvenia Redden, PA-C  metFORMIN (GLUCOPHAGE) 500 MG tablet Take by mouth 2 (two) times daily with a meal.    [provider]  metoprolol tartrate (LOPRESSOR) 50 MG tablet Take 50 mg by mouth 2 (two) times daily.    [provider]  Multiple Vitamin (MULTIVITAMIN) tablet Take 1 tablet by mouth daily.      [provider]  nitroGLYCERIN (NITROSTAT) 0.4 MG SL tablet Place 0.4 mg under the tongue every 5 (five) minutes as needed for chest  pain.    [provider]  NONFORMULARY OR COMPOUNDED ITEM Estradiol 0.02 % 65ml prefilled applicator Sig: apply twice a week 06/03/15   Terrance Mass, MD  omeprazole (PRILOSEC) 40 MG capsule Take 1 capsule (40 mg total) by mouth daily. 07/14/12   Sable Feil, MD  ondansetron (ZOFRAN) 8 MG tablet Take by mouth every 8 (eight) hours as needed for nausea or vomiting.    [provider]  potassium chloride SA (K-DUR,KLOR-CON) 20 MEQ tablet Take 20 mEq by mouth 2 (two) times daily.      [provider]  predniSONE (DELTASONE) 2.5 MG tablet Take 2.5 mg by mouth daily with  breakfast.    [provider]  pregabalin (LYRICA) 150 MG capsule Take 150 mg by mouth 2 (two) times daily.     [provider]  ranitidine (ZANTAC) 150 MG capsule Take 150 mg by mouth as needed.      [provider]  spironolactone (ALDACTONE) 25 MG tablet Take 25 mg by mouth daily.    [provider]  traMADol (ULTRAM) 50 MG tablet Take 1 tablet (50 mg total) by mouth every 6 (six) hours as needed. 06/23/17   Luvenia Redden, PA-C  traMADol (ULTRAM) 50 MG tablet Take 1 tablet (50 mg total) by mouth 2 (two) times daily. 12/27/17   Kelijah Towry, Dannielle Karvonen, PA-C  valACYclovir (VALTREX) 500 MG tablet TAKE 1 TABLET DAILY 08/25/15   Terrance Mass, MD    Allergies Patient has no known allergies.  Family History  Problem Relation Age of Onset  . Colon cancer Mother        21's dx  . Hypertension Mother   . Heart disease Mother   . Cancer Mother        ORAL  . Lung cancer Father   . Hypertension Sister   . Heart disease Sister   . Diabetes Sister   . Hypertension Maternal Grandmother   . Heart disease Maternal Grandmother   . Colon cancer Paternal Grandfather   . Stroke Brother   . Esophageal cancer Neg Hx   . Rectal cancer Neg Hx   . Stomach cancer Neg Hx     Social History Social History   Tobacco Use  . Smoking status: Never Smoker  .  Smokeless tobacco: Never Used  Substance Use Topics  . Alcohol use: No  . Drug use: No    Review of Systems  Constitutional: Negative for fever. Eyes: Negative for visual changes. ENT: Negative for sore throat. Cardiovascular: Negative for chest pain. Respiratory: Negative for shortness of breath. Gastrointestinal: Negative for abdominal pain, vomiting and diarrhea. Genitourinary: Negative for dysuria. Musculoskeletal: Negative for back pain.  Left shoulder and rib pain as noted above.   Skin: Negative for rash. Neurological: Negative for headaches, focal weakness or numbness. ____________________________________________  PHYSICAL EXAM:  VITAL SIGNS: ED Triage Vitals  Enc Vitals Group     BP 12/27/17 1018 (!) 138/43     Pulse Rate 12/27/17 1018 68     Resp 12/27/17 1018 16     Temp 12/27/17 1018 98.2 F (36.8 C)     Temp Source 12/27/17 1018 Oral     SpO2 12/27/17 1018 99 %     Weight 12/27/17 1020 210 lb (95.3 kg)     Height 12/27/17 1020 5\' 8"  (1.727 m)     Head Circumference --      Peak Flow --      Pain Score 12/27/17 1019 6     Pain Loc --      Pain Edu? --      Excl. in [place]? --     Constitutional: Alert and oriented. Well appearing and in no distress. Head: Normocephalic and atraumatic.  Minimal ecchymosis noted across the nasal bridge consistent with contusion from eyeglasses. Eyes: Conjunctivae are normal. PERRL. Normal extraocular movements Neck: Supple. No thyromegaly.  Normal range of motion without crepitus.  No midline tenderness is appreciated.  No distracting injuries noted. Hematological/Lymphatic/Immunological: No cervical lymphadenopathy. Cardiovascular: Normal rate, regular rhythm. Normal distal pulses. Respiratory: Normal respiratory effort. No wheezes/rales/rhonchi. Gastrointestinal: Soft and nontender. No distention. Musculoskeletal: Left shoulder without  obvious deformity or dislocation.  There is some mild ecchymosis to the anterior arm and  shoulder noted.  Patient is nontender to palpation over the left clavicle.  No obvious deformity is appreciated.  Nontender with normal range of motion in all extremities.  Neurologic:  Normal gait without ataxia. Normal speech and language. No gross focal neurologic deficits are appreciated. Skin:  Skin is warm, dry and intact. No rash, abrasion, or ecchymosis noted.  Noted. ____________________________________________   LABS (pertinent positives/negatives) Labs Reviewed  URINALYSIS, COMPLETE (UACMP) WITH MICROSCOPIC - Abnormal; Notable for the following components:      Result Value   Color, Urine YELLOW (*)    APPearance CLOUDY (*)    Hgb urine dipstick LARGE (*)    Protein, ur 30 (*)    Squamous Epithelial / LPF 0-5 (*)    All other components within normal limits  ____________________________________________   RADIOLOGY  Left Shoulder negative  Left Rib Detail w/ CXR negative ____________________________________________  PROCEDURES  Procedures Ultram 50 mg PO ____________________________________________  INITIAL IMPRESSION / ASSESSMENT AND PLAN / ED COURSE  Patient with ED evaluation of injury sustained following a mechanical fall at home.  Patient's exam is overall benign.  She is reassured by her negative x-rays & UA.  Patient and her sister will be discharged with instructions to dose home medications as prescribed.  She may apply ice moist heat and sore muscles as needed.  Follow-up with primary provider for ongoing symptom management. ____________________________________________  FINAL CLINICAL IMPRESSION(S) / ED DIAGNOSES  Final diagnoses:  Fall in home, initial encounter  Acute pain of left shoulder  Rib pain on left side      Louanne Calvillo, Dannielle Karvonen, PA-C 12/27/17 1319    Nena Polio, MD 12/27/17 [phone]

## 2017-12-27 NOTE — Discharge Instructions (Addendum)
Your exam and x-rays are normal at this time. Take the pain medicine as needed. Take your home meds as directed. Follow-up with your provider for ongoing symptoms.

## 2017-12-27 NOTE — ED Triage Notes (Addendum)
Brought in via ems s/p fall  States she fell last pm  Having pain to left shoulder and rib area  States she was reaching form a water bottle and fell from bed  Became stuck between bed and nightstand  Was able to slide around floor but not able to stand  States she has been using walker d/t falls

## 2018-07-27 ENCOUNTER — Ambulatory Visit
Admission: RE | Admit: 2018-07-27 | Discharge: 2018-07-27 | Disposition: A | Discharge status: Home / Self Care | Payer: Medicare Other | Source: Ambulatory Visit | Attending: *Deleted | Admitting: *Deleted

## 2018-07-27 ENCOUNTER — Encounter: Payer: Self-pay | Admitting: Emergency Medicine

## 2018-07-27 ENCOUNTER — Ambulatory Visit
Admission: EM | Admit: 2018-07-27 | Discharge: 2018-07-27 | Disposition: A | Discharge status: Home / Self Care | Payer: Medicare Other | Attending: Physician Assistant | Admitting: Physician Assistant

## 2018-07-27 ENCOUNTER — Other Ambulatory Visit: Payer: Self-pay

## 2018-07-27 DIAGNOSIS — M797 Fibromyalgia: Secondary | ICD-10-CM | POA: Insufficient documentation

## 2018-07-27 DIAGNOSIS — M79672 Pain in left foot: Secondary | ICD-10-CM

## 2018-07-27 DIAGNOSIS — Z7984 Long term (current) use of oral hypoglycemic drugs: Secondary | ICD-10-CM | POA: Insufficient documentation

## 2018-07-27 DIAGNOSIS — Z7952 Long term (current) use of systemic steroids: Secondary | ICD-10-CM | POA: Insufficient documentation

## 2018-07-27 DIAGNOSIS — E039 Hypothyroidism, unspecified: Secondary | ICD-10-CM | POA: Insufficient documentation

## 2018-07-27 DIAGNOSIS — Z833 Family history of diabetes mellitus: Secondary | ICD-10-CM | POA: Insufficient documentation

## 2018-07-27 DIAGNOSIS — Z79890 Hormone replacement therapy (postmenopausal): Secondary | ICD-10-CM | POA: Insufficient documentation

## 2018-07-27 DIAGNOSIS — G4733 Obstructive sleep apnea (adult) (pediatric): Secondary | ICD-10-CM | POA: Insufficient documentation

## 2018-07-27 DIAGNOSIS — M79662 Pain in left lower leg: Secondary | ICD-10-CM | POA: Diagnosis not present

## 2018-07-27 DIAGNOSIS — Z7982 Long term (current) use of aspirin: Secondary | ICD-10-CM | POA: Insufficient documentation

## 2018-07-27 DIAGNOSIS — I8390 Asymptomatic varicose veins of unspecified lower extremity: Secondary | ICD-10-CM | POA: Diagnosis not present

## 2018-07-27 DIAGNOSIS — F329 Major depressive disorder, single episode, unspecified: Secondary | ICD-10-CM | POA: Insufficient documentation

## 2018-07-27 DIAGNOSIS — M25572 Pain in left ankle and joints of left foot: Secondary | ICD-10-CM | POA: Insufficient documentation

## 2018-07-27 DIAGNOSIS — M7989 Other specified soft tissue disorders: Secondary | ICD-10-CM | POA: Insufficient documentation

## 2018-07-27 DIAGNOSIS — I10 Essential (primary) hypertension: Secondary | ICD-10-CM | POA: Insufficient documentation

## 2018-07-27 DIAGNOSIS — R7989 Other specified abnormal findings of blood chemistry: Secondary | ICD-10-CM | POA: Diagnosis not present

## 2018-07-27 DIAGNOSIS — K760 Fatty (change of) liver, not elsewhere classified: Secondary | ICD-10-CM | POA: Diagnosis not present

## 2018-07-27 DIAGNOSIS — E1142 Type 2 diabetes mellitus with diabetic polyneuropathy: Secondary | ICD-10-CM | POA: Diagnosis not present

## 2018-07-27 DIAGNOSIS — K581 Irritable bowel syndrome with constipation: Secondary | ICD-10-CM | POA: Insufficient documentation

## 2018-07-27 DIAGNOSIS — K3184 Gastroparesis: Secondary | ICD-10-CM | POA: Diagnosis not present

## 2018-07-27 DIAGNOSIS — E1143 Type 2 diabetes mellitus with diabetic autonomic (poly)neuropathy: Secondary | ICD-10-CM | POA: Insufficient documentation

## 2018-07-27 DIAGNOSIS — Z791 Long term (current) use of non-steroidal anti-inflammatories (NSAID): Secondary | ICD-10-CM | POA: Insufficient documentation

## 2018-07-27 DIAGNOSIS — M25472 Effusion, left ankle: Secondary | ICD-10-CM | POA: Diagnosis not present

## 2018-07-27 DIAGNOSIS — K219 Gastro-esophageal reflux disease without esophagitis: Secondary | ICD-10-CM | POA: Insufficient documentation

## 2018-07-27 DIAGNOSIS — G5603 Carpal tunnel syndrome, bilateral upper limbs: Secondary | ICD-10-CM | POA: Insufficient documentation

## 2018-07-27 DIAGNOSIS — Z79899 Other long term (current) drug therapy: Secondary | ICD-10-CM | POA: Insufficient documentation

## 2018-07-27 DIAGNOSIS — Z8249 Family history of ischemic heart disease and other diseases of the circulatory system: Secondary | ICD-10-CM | POA: Insufficient documentation

## 2018-07-27 DIAGNOSIS — R791 Abnormal coagulation profile: Secondary | ICD-10-CM

## 2018-07-27 LAB — CBC WITH DIFFERENTIAL/PLATELET
Abs Immature Granulocytes: 0.02 10*3/uL (ref 0.00–0.07)
BASOS ABS: 0 10*3/uL (ref 0.0–0.1)
Basophils Relative: 1 %
EOS ABS: 0.3 10*3/uL (ref 0.0–0.5)
EOS PCT: 5 %
HEMATOCRIT: 37.1 % (ref 36.0–46.0)
Hemoglobin: 12 g/dL (ref 12.0–15.0)
Immature Granulocytes: 0 %
Lymphocytes Relative: 17 %
Lymphs Abs: 1 10*3/uL (ref 0.7–4.0)
MCH: 29.4 pg (ref 26.0–34.0)
MCHC: 32.3 g/dL (ref 30.0–36.0)
MCV: 90.9 fL (ref 80.0–100.0)
MONO ABS: 0.5 10*3/uL (ref 0.1–1.0)
Monocytes Relative: 8 %
NRBC: 0 % (ref 0.0–0.2)
Neutro Abs: 3.9 10*3/uL (ref 1.7–7.7)
Neutrophils Relative %: 69 %
Platelets: 360 10*3/uL (ref 150–400)
RBC: 4.08 MIL/uL (ref 3.87–5.11)
RDW: 15.6 % — AB (ref 11.5–15.5)
WBC: 5.7 10*3/uL (ref 4.0–10.5)

## 2018-07-27 LAB — FIBRIN DERIVATIVES D-DIMER (ARMC ONLY): FIBRIN DERIVATIVES D-DIMER (ARMC): 920.72 ng{FEU}/mL — AB (ref 0.00–499.00)

## 2018-07-27 LAB — COMPREHENSIVE METABOLIC PANEL
ALT: 18 U/L (ref 0–44)
AST: 26 U/L (ref 15–41)
Albumin: 4.3 g/dL (ref 3.5–5.0)
Alkaline Phosphatase: 117 U/L (ref 38–126)
Anion gap: 10 (ref 5–15)
BILIRUBIN TOTAL: 1 mg/dL (ref 0.3–1.2)
BUN: 23 mg/dL (ref 8–23)
CHLORIDE: 106 mmol/L (ref 98–111)
CO2: 25 mmol/L (ref 22–32)
CREATININE: 1.24 mg/dL — AB (ref 0.44–1.00)
Calcium: 8.9 mg/dL (ref 8.9–10.3)
GFR, EST AFRICAN AMERICAN: 49 mL/min — AB (ref >60)
GFR, EST NON AFRICAN AMERICAN: 43 mL/min — AB (ref >60)
Glucose, Bld: 216 mg/dL — ABNORMAL HIGH (ref 70–99)
POTASSIUM: 4.3 mmol/L (ref 3.5–5.1)
Sodium: 141 mmol/L (ref 135–145)
Total Protein: 7.7 g/dL (ref 6.5–8.1)

## 2018-07-27 NOTE — ED Provider Notes (Signed)
[facility]    CSN: [id number] Arrival date & time: 07/27/18  1437     History   Chief Complaint Chief Complaint  Patient presents with  . Ankle Pain    left    HPI [name] is a 72 y.o. female. Caucasian female presents today for vague left foot, ankle and calf pain x 2 days. Pain is intermittent and mostly dull, but sharp at times. She denies known injury, but admits to peripheral neuropathy up to the anterior tibial regions. Denies any sores or skin color changes. She states that she had severe swelling and pain of the entire left foot and ankle with radiation to the calf yesterday, but the swelling resolved and the pain improved with ice, elevation and tylenol. Admits to varicose veins, but denies history of DVT or PVD. She denies foot/leg weakness, gait changes or falls.   HPI  Past Medical History:  Diagnosis Date  . Candida esophagitis ([place])   . Carpal tunnel syndrome   . Diabetes mellitus    type II  . Dysplasia of cervix, low grade (CIN 1)   . Esophageal yeast infection ([place]) 2011  . Family history of malignant neoplasm of gastrointestinal tract   . Fatty liver   . Fibromyalgia   . Fundic gland polyps of stomach, benign   . Gastritis   . GERD (gastroesophageal reflux disease)   . Hemochromatosis   . Herpes   . Hypertension   . Hypothyroidism   . IBS (irritable bowel syndrome)   . Neuropathy   . Pelvic adhesions   . Peripheral neuropathy   . Psoriatic arthritis ([place])   . Sleep apnea    no CPAP used  . Unspecified gastritis and gastroduodenitis without mention of hemorrhage     Patient Active Problem List   Diagnosis Date Noted  . Acute bilateral thoracic back pain 06/25/2017  . Closed fracture of right scapula with routine healing 06/25/2017  . Hx of essential hypertension 06/25/2017  . Elevated blood pressure reading 06/25/2017  . Vaginal atrophy 05/14/2013  . Carpal tunnel syndrome   . Drug-induced constipation 06/15/2011  .  GERD (gastroesophageal reflux disease) 06/15/2011  . Dysplasia of cervix, low grade (CIN 1)   . Pelvic adhesions   . Herpes   . Peripheral neuropathy   . Diabetes mellitus   . Hemochromatosis   . CANDIDIASIS, ESOPHAGEAL 12/11/2010  . OBSTRUCTIVE SLEEP APNEA 12/07/2010  . HYPERGLYCEMIA 09/25/2010  . NAUSEA 09/08/2010  . ABDOMINAL PAIN, EPIGASTRIC 09/08/2010  . DIARRHEA 06/23/2010  . HEPATOMEGALY 06/23/2010  . 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 PERIPHERAL NEUROPATHY 02/22/2008  . HYPERTENSION 02/22/2008  . GERD 02/22/2008  . FATTY LIVER DISEASE 02/22/2008  . RENAL CYST, LEFT 02/22/2008  . INTERSTITIAL CYSTITIS 02/22/2008  . UTI'S, RECURRENT 02/22/2008  . DEGENERATIVE JOINT DISEASE 02/22/2008  . HERNIATED LUMBOSACRAL DISC 02/22/2008  . [place] DISEASE, CERVICAL 02/22/2008  . FIBROMYALGIA 02/22/2008  . SLEEP APNEA 02/22/2008  . EARLY SATIETY 02/22/2008  . 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 GASTRITIS 01/24/2008  . IRRITABLE BOWEL SYNDROME 01/24/2008  . DEPRESSION 01/19/2008  . GASTROPARESIS 01/19/2008  . MENINGIOMA 05/22/1996    Past Surgical History:  Procedure Laterality Date  . APPENDECTOMY     dx lap   . BACK SURGERY     L1-L5  . CERVICAL FUSION     c5-c7  . DL LEFT SALPINGECTOMY LYSIS OF ADHESIONS, RIGHT BTL    . FOOT SURGERY    . lap chloecystectomy    . PELVIC  LAPAROSCOPY    . TUBAL LIGATION     RIGHT    OB History    Gravida  0   Para      Term      Preterm      AB      Living        SAB      TAB      Ectopic      Multiple      Live Births               Home Medications    Prior to Admission medications   Medication Sig Start Date End Date Taking? Authorizing Provider  amLODipine (NORVASC) 5 MG tablet Take 5 mg by mouth daily.   Yes [provider]  aspirin 81 MG tablet Take 81 mg by mouth daily.   Yes [provider]  atorvastatin (LIPITOR) 10 MG tablet Take 10 mg by mouth daily.   Yes [provider]    DULoxetine (CYMBALTA) 30 MG capsule Take 30 mg by mouth 2 (two) times daily.   Yes [provider]  furosemide (LASIX) 40 MG tablet Take 40 mg by mouth 2 (two) times daily.     Yes [provider]  glimepiride (AMARYL) 4 MG tablet Take 4 mg by mouth daily before breakfast.     Yes [provider]  lamoTRIgine (LAMICTAL) 200 MG tablet Take 200 mg by mouth daily.     Yes [provider]  levothyroxine (SYNTHROID, LEVOTHROID) 25 MCG tablet Take 25 mcg by mouth daily.     Yes [provider]  lisinopril (PRINIVIL,ZESTRIL) 20 MG tablet Take 20 mg by mouth daily.   Yes [provider]  Melatonin 1 MG TABS Take by mouth.   Yes [provider]  metFORMIN (GLUCOPHAGE) 500 MG tablet Take by mouth 2 (two) times daily with a meal.   Yes [provider]  metoprolol tartrate (LOPRESSOR) 50 MG tablet Take 50 mg by mouth 2 (two) times daily.   Yes [provider]  Multiple Vitamin (MULTIVITAMIN) tablet Take 1 tablet by mouth daily.     Yes [provider]  omeprazole (PRILOSEC) 40 MG capsule Take 1 capsule (40 mg total) by mouth daily. 07/14/12  Yes Sable Feil, MD  pregabalin (LYRICA) 150 MG capsule Take 150 mg by mouth 2 (two) times daily.    Yes [provider]  Secukinumab (COSENTYX) 150 MG/ML SOSY Inject into the skin.   Yes [provider]  traMADol (ULTRAM) 50 MG tablet Take 1 tablet (50 mg total) by mouth every 6 (six) hours as needed. 06/23/17  Yes Luvenia Redden, PA-C  vitamin B-12 (CYANOCOBALAMIN) 1000 MCG tablet Take 1,000 mcg by mouth daily.   Yes [provider]  azelastine (ASTELIN) 0.1 % nasal spray Place 1 spray into both nostrils 2 (two) times daily. Use in each nostril as directed    [provider]  Calcium Carbonate-Vitamin D (CALCIUM + D PO) Take 1 tablet by mouth daily.     [provider]  cetirizine (ZYRTEC) 10 MG tablet Take 10 mg by mouth daily.     [provider]  folic acid (FOLVITE) 1 MG tablet Take 1 mg by mouth daily.    [provider]  hydrochlorothiazide (HYDRODIURIL) 25 MG tablet Take 25 mg by mouth daily.    [provider]  InFLIXimab (REMICADE IV) Inject into the vein.    [provider]  meloxicam (MOBIC) 7.5 MG tablet Take 1 tablet (7.5 mg total) by mouth daily as needed for pain. 06/23/17   Luvenia Redden, PA-C  nitroGLYCERIN (NITROSTAT) 0.4 MG SL tablet Place 0.4 mg under the tongue every 5 (five) minutes as needed for chest pain.    [provider]  NONFORMULARY OR COMPOUNDED ITEM Estradiol 0.02 % 62ml prefilled applicator Sig: apply twice a week 06/03/15   Terrance Mass, MD  ondansetron (ZOFRAN) 8 MG tablet Take by mouth every 8 (eight) hours as needed for nausea or vomiting.    [provider]  potassium chloride SA (K-DUR,KLOR-CON) 20 MEQ tablet Take 20 mEq by mouth 2 (two) times daily.      [provider]  predniSONE (DELTASONE) 2.5 MG tablet Take 2.5 mg by mouth daily with breakfast.    [provider]  ranitidine (ZANTAC) 150 MG capsule Take 150 mg by mouth as needed.      [provider]  spironolactone (ALDACTONE) 25 MG tablet Take 25 mg by mouth daily.    [provider]  traMADol (ULTRAM) 50 MG tablet Take 1 tablet (50 mg total) by mouth 2 (two) times daily. 12/27/17   Menshew, Dannielle Karvonen, PA-C  valACYclovir (VALTREX) 500 MG tablet TAKE 1 TABLET DAILY 08/25/15   Terrance Mass, MD    Family History Family History  Problem Relation Age of Onset  . Colon cancer Mother        41's dx  . Hypertension Mother   . Heart disease Mother   . Cancer Mother        ORAL  . Lung cancer Father   . Hypertension Sister   . Heart disease Sister   . Diabetes Sister   . Hypertension Maternal Grandmother   . Heart disease Maternal Grandmother   . Colon cancer Paternal Grandfather   . Stroke Brother   . Esophageal  cancer Neg Hx   . Rectal cancer Neg Hx   . Stomach cancer Neg Hx     Social History Social History   Tobacco Use  . Smoking status: Never Smoker  . Smokeless tobacco: Never Used  Substance Use Topics  . Alcohol use: No  . Drug use: No     Allergies   Patient has no known allergies.   Review of Systems Review of Systems  Constitutional: Negative for fatigue and fever.  Respiratory: Negative for cough and shortness of breath.   Cardiovascular: Negative for chest pain, palpitations and leg swelling.  Gastrointestinal: Negative for abdominal pain, nausea and vomiting.  Musculoskeletal: Positive for arthralgias (vague left foot and ankle pain) and joint swelling (no swelling today, +swelling of foot and ankle yesterday). Negative for gait problem and myalgias.  Skin: Negative for color change, pallor, rash and wound.  Neurological: Positive for numbness (history of peripheral neuropathy bilat LEs). Negative for dizziness and weakness.  Hematological: Does not bruise/bleed easily.     Physical Exam Triage Vital Signs ED Triage Vitals  Enc Vitals Group     BP 07/27/18 1503 (!) 151/62     Pulse Rate 07/27/18 1503 61     Resp 07/27/18 1503 16     Temp 07/27/18 1503 98.4 F (36.9 C)     Temp Source 07/27/18 1503 Oral     SpO2 07/27/18 1503 98 %     Weight 07/27/18 1457 200 lb (90.7 kg)     Height 07/27/18 1457 5\' 8"  (1.727 m)     Head Circumference --  Peak Flow --      Pain Score 07/27/18 1456 4     Pain Loc --      Pain Edu? --      Excl. in [place]? --    No data found.  Updated Vital Signs BP (!) 151/62 (BP Location: Left Arm)   Pulse 61   Temp 98.4 F (36.9 C) (Oral)   Resp 16   Ht 5\' 8"  (1.727 m)   Wt 200 lb (90.7 kg)   LMP 10/04/2001   SpO2 98%   BMI 30.41 kg/m      Physical Exam  Constitutional: She is oriented to person, place, and time. She appears well-developed and well-nourished. No distress.  HENT:  Head: Normocephalic and atraumatic.    Eyes: No scleral icterus.  Cardiovascular: Normal rate, regular rhythm and normal heart sounds.  Pulmonary/Chest: Effort normal and breath sounds normal. No respiratory distress. She has no wheezes.  Musculoskeletal:  LEFT LOWER EXTREMITY: 1+non pitting edema dorsal foot and ankle (bilaterally), no ecchymosis or erythema. No tenderness to palpation of the medial or latera malleolus or any other bones, no current calf tenderness. 5/5 strength of foot/ankle, decreased sensation dorsal foot extending to proximal ant tibia (bilaterally), normal gait  Neurological: She is alert and oriented to person, place, and time.  Skin: Skin is warm and dry. Capillary refill takes less than 2 seconds. No rash noted. She is not diaphoretic. No erythema. No pallor.  Psychiatric: She has a normal mood and affect. Her behavior is normal.  Nursing note and vitals reviewed.    UC Treatments / Results  Labs (all labs ordered are listed, but only abnormal results are displayed) Labs Reviewed  COMPREHENSIVE METABOLIC PANEL - Abnormal; Notable for the following components:      Result Value   Glucose, Bld 216 (*)    Creatinine, Ser 1.24 (*)    GFR calc non Af Amer 43 (*)    GFR calc Af Amer 49 (*)    All other components within normal limits  CBC WITH DIFFERENTIAL/PLATELET - Abnormal; Notable for the following components:   RDW 15.6 (*)    All other components within normal limits  FIBRIN DERIVATIVES D-DIMER ([facility] ONLY) - Abnormal; Notable for the following components:   Fibrin derivatives D-dimer [facility]) 920.72 (*)    All other components within normal limits    EKG None  Radiology US Venous Img Lower Unilateral Left  Result Date: 07/27/2018 CLINICAL DATA:  72 year old female with left foot and ankle swelling for the past 2 days. EXAM: LEFT LOWER EXTREMITY VENOUS DOPPLER ULTRASOUND TECHNIQUE: Gray-scale sonography with graded compression, as well as color Doppler and duplex ultrasound were performed to  evaluate the lower extremity deep venous systems from the level of the common femoral vein and including the common femoral, femoral, profunda femoral, popliteal and calf veins including the posterior tibial, peroneal and gastrocnemius veins when visible. The superficial great saphenous vein was also interrogated. Spectral Doppler was utilized to evaluate flow at rest and with distal augmentation maneuvers in the common femoral, femoral and popliteal veins. COMPARISON:  None. FINDINGS: Contralateral Common Femoral Vein: Respiratory phasicity is normal and symmetric with the symptomatic side. No evidence of thrombus. Normal compressibility. Common Femoral Vein: No evidence of thrombus. Normal compressibility, respiratory phasicity and response to augmentation. Saphenofemoral Junction: No evidence of thrombus. Normal compressibility and flow on color Doppler imaging. Profunda Femoral Vein: No evidence of thrombus. Normal compressibility and flow on color Doppler imaging. Femoral Vein: No  evidence of thrombus. Normal compressibility, respiratory phasicity and response to augmentation. Popliteal Vein: No evidence of thrombus. Normal compressibility, respiratory phasicity and response to augmentation. Calf Veins: No evidence of thrombus. Normal compressibility and flow on color Doppler imaging. Superficial Great Saphenous Vein: No evidence of thrombus. Normal compressibility. Venous Reflux:  None. Other Findings:  None. IMPRESSION: No evidence of deep venous thrombosis. Electronically Signed   By: Jacqulynn Cadet M.D.   On: 07/27/2018 18:16    Procedures Procedures (including critical care time)  Medications Ordered in UC Medications - No data to display  Initial Impression / Assessment and Plan / UC Course  I have reviewed the triage vital signs and the nursing notes.  Pertinent labs & imaging results that were available during my care of the patient were reviewed by me and considered in my medical  decision making (see chart for details).   On exam, no obvious signs of DVT and no bony tenderness. Low suspicion for DVT or fracture. Labs drawn for CBC, CMP, and D-dimer. CBC to rule out infection due to diabetes diagnosis. CMP to rule out renal issues due to extremity swelling and also to assess for electrolyte disturbances due to history of hypokalemia. D-dimer to hopefully rule out DVT. Again, suspicion is low at this time but given poorly localized pain, swelling and varicose veins will obtain further labs.   CBC normal. CMP with low GFR but improved from previous values. No hypokalemia. D-dimer elevated >900. Will send for [place] of lower left leg to rule out DVT. Discussed treatment if no DVT. Will discuss DVT treatment if DVT found on imaging. Will send to ER for evaluation and treatment ultimately since she will need renal dosing if DVT.  Results of lower extremity US received at 1825. Results negative for DVT. Discussed results with patient over the phone and reassured her of negative results. Advised conservative care and following up with PCP as discussed.   Final Clinical Impressions(s) / UC Diagnoses   Final diagnoses:  Acute left ankle pain  Left ankle swelling  Elevated d-dimer     Discharge Instructions     LEFT FOOT/ANKLE PAIN AND SWELLING: You have had an elevated D-dimer test today which could indicate a deep vein blood clot causing your pain. You will be scheduled for an US of the left lower extremity as soon as possible to rule out a DVT. If no DVT, your pain is likely related to your neuropathy and you should rest and ice the ankle/foot, keep elevated and continue Tylenol/gabapentin for pain relief. F/u w your PCP for any persistent or worsening symptoms. If acutely worse, please go to ER.  Will inform you of [place] results as soon as we get them. Will send to ER if DVT is found on imaging.     ED Prescriptions    None     Controlled Substance Prescriptions [place]  Controlled Substance Registry consulted? Not Applicable   Gretta Cool 07/29/18 [id number]

## 2018-07-27 NOTE — ED Triage Notes (Signed)
Patient c/o left ankle pain that started 2 nights ago.  Patient does not remember any injury or fall.

## 2018-07-27 NOTE — ED Notes (Signed)
Ultrasound scheduled for today at [facility]

## 2018-07-27 NOTE — Discharge Instructions (Addendum)
LEFT FOOT/ANKLE PAIN AND SWELLING: You have had an elevated D-dimer test today which could indicate a deep vein blood clot causing your pain. You will be scheduled for an US of the left lower extremity as soon as possible to rule out a DVT. If no DVT, your pain is likely related to your neuropathy and you should rest and ice the ankle/foot, keep elevated and continue Tylenol/gabapentin for pain relief. F/u w your PCP for any persistent or worsening symptoms. If acutely worse, please go to ER.  Will inform you of [place] results as soon as we get them. Will send to ER if DVT is found on imaging.

## 2018-08-01 ENCOUNTER — Encounter: Payer: Self-pay | Admitting: *Deleted

## 2018-08-01 ENCOUNTER — Other Ambulatory Visit: Payer: Self-pay

## 2018-08-03 NOTE — Discharge Instructions (Signed)

## 2018-08-07 ENCOUNTER — Ambulatory Visit: Payer: Medicare Other | Admitting: Student in an Organized Health Care Education/Training Program

## 2018-08-07 ENCOUNTER — Encounter
Admission: RE | Disposition: A | Discharge status: Home / Self Care | Payer: Self-pay | Source: Ambulatory Visit | Attending: Ophthalmology

## 2018-08-07 ENCOUNTER — Ambulatory Visit
Admission: RE | Admit: 2018-08-07 | Discharge: 2018-08-07 | Disposition: A | Discharge status: Home / Self Care | Payer: Medicare Other | Source: Ambulatory Visit | Attending: Ophthalmology | Admitting: Ophthalmology

## 2018-08-07 DIAGNOSIS — E039 Hypothyroidism, unspecified: Secondary | ICD-10-CM | POA: Insufficient documentation

## 2018-08-07 DIAGNOSIS — Z7984 Long term (current) use of oral hypoglycemic drugs: Secondary | ICD-10-CM | POA: Diagnosis not present

## 2018-08-07 DIAGNOSIS — M797 Fibromyalgia: Secondary | ICD-10-CM | POA: Insufficient documentation

## 2018-08-07 DIAGNOSIS — G4730 Sleep apnea, unspecified: Secondary | ICD-10-CM | POA: Diagnosis not present

## 2018-08-07 DIAGNOSIS — Z7982 Long term (current) use of aspirin: Secondary | ICD-10-CM | POA: Diagnosis not present

## 2018-08-07 DIAGNOSIS — F329 Major depressive disorder, single episode, unspecified: Secondary | ICD-10-CM | POA: Diagnosis not present

## 2018-08-07 DIAGNOSIS — Z8673 Personal history of transient ischemic attack (TIA), and cerebral infarction without residual deficits: Secondary | ICD-10-CM | POA: Diagnosis not present

## 2018-08-07 DIAGNOSIS — E7800 Pure hypercholesterolemia, unspecified: Secondary | ICD-10-CM | POA: Insufficient documentation

## 2018-08-07 DIAGNOSIS — N183 Chronic kidney disease, stage 3 (moderate): Secondary | ICD-10-CM | POA: Insufficient documentation

## 2018-08-07 DIAGNOSIS — L4050 Arthropathic psoriasis, unspecified: Secondary | ICD-10-CM | POA: Insufficient documentation

## 2018-08-07 DIAGNOSIS — Z79899 Other long term (current) drug therapy: Secondary | ICD-10-CM | POA: Insufficient documentation

## 2018-08-07 DIAGNOSIS — K219 Gastro-esophageal reflux disease without esophagitis: Secondary | ICD-10-CM | POA: Insufficient documentation

## 2018-08-07 DIAGNOSIS — I129 Hypertensive chronic kidney disease with stage 1 through stage 4 chronic kidney disease, or unspecified chronic kidney disease: Secondary | ICD-10-CM | POA: Diagnosis not present

## 2018-08-07 DIAGNOSIS — H2511 Age-related nuclear cataract, right eye: Secondary | ICD-10-CM | POA: Diagnosis not present

## 2018-08-07 HISTORY — DX: Dyspnea, unspecified: R06.00

## 2018-08-07 HISTORY — DX: Presence of external hearing-aid: Z97.4

## 2018-08-07 HISTORY — DX: Chronic kidney disease, stage 3 (moderate): N18.3

## 2018-08-07 HISTORY — DX: Chronic kidney disease, stage 3 unspecified: N18.30

## 2018-08-07 HISTORY — DX: Family history of other specified conditions: Z84.89

## 2018-08-07 HISTORY — PX: CATARACT EXTRACTION W/PHACO: SHX586

## 2018-08-07 HISTORY — DX: Cerebral infarction, unspecified: I63.9

## 2018-08-07 LAB — GLUCOSE, CAPILLARY
GLUCOSE-CAPILLARY: 125 mg/dL — AB (ref 70–99)
GLUCOSE-CAPILLARY: 137 mg/dL — AB (ref 70–99)

## 2018-08-07 SURGERY — PHACOEMULSIFICATION, CATARACT, WITH IOL INSERTION
Anesthesia: Monitor Anesthesia Care | Site: Eye | Laterality: Right

## 2018-08-07 MED ORDER — EPINEPHRINE PF 1 MG/ML IJ SOLN
INTRAOCULAR | Status: DC | PRN
  Administered 2018-08-07: 68 mL via OPHTHALMIC

## 2018-08-07 MED ORDER — SODIUM HYALURONATE 10 MG/ML IO SOLN
INTRAOCULAR | Status: DC | PRN
  Administered 2018-08-07: 0.55 mL via INTRAOCULAR

## 2018-08-07 MED ORDER — MIDAZOLAM HCL 2 MG/2ML IJ SOLN
INTRAMUSCULAR | Status: DC | PRN
  Administered 2018-08-07: 2 mg via INTRAVENOUS

## 2018-08-07 MED ORDER — FENTANYL CITRATE (PF) 100 MCG/2ML IJ SOLN
INTRAMUSCULAR | Status: DC | PRN
  Administered 2018-08-07 (×2): 25 ug via INTRAVENOUS

## 2018-08-07 MED ORDER — SODIUM HYALURONATE 23 MG/ML IO SOLN
INTRAOCULAR | Status: DC | PRN
  Administered 2018-08-07: 0.6 mL via INTRAOCULAR

## 2018-08-07 MED ORDER — LACTATED RINGERS IV SOLN: 10.0000 mL/h | INTRAVENOUS | Status: DC

## 2018-08-07 MED ORDER — ARMC OPHTHALMIC DILATING DROPS
1.0000 "application " | OPHTHALMIC | Status: DC | PRN
  Administered 2018-08-07 (×3): 1 via OPHTHALMIC

## 2018-08-07 MED ORDER — MOXIFLOXACIN HCL 0.5 % OP SOLN
OPHTHALMIC | Status: DC | PRN
  Administered 2018-08-07: 0.2 mL via OPHTHALMIC

## 2018-08-07 MED ORDER — ONDANSETRON HCL 4 MG/2ML IJ SOLN: 4.0000 mg | Freq: Once | INTRAMUSCULAR | Status: DC | PRN

## 2018-08-07 MED ORDER — LIDOCAINE HCL (PF) 2 % IJ SOLN
INTRAOCULAR | Status: DC | PRN
  Administered 2018-08-07: 1 mL via INTRAOCULAR

## 2018-08-07 MED ORDER — TETRACAINE HCL 0.5 % OP SOLN
1.0000 [drp] | OPHTHALMIC | Status: DC | PRN
  Administered 2018-08-07 (×2): 1 [drp] via OPHTHALMIC

## 2018-08-07 SURGICAL SUPPLY — 19 items
CANNULA ANT/CHMB 27G (MISCELLANEOUS) ×1 IMPLANT
CANNULA ANT/CHMB 27GA (MISCELLANEOUS) ×3 IMPLANT
DISSECTOR HYDRO NUCLEUS 50X22 (MISCELLANEOUS) ×3 IMPLANT
GLOVE SURG LX 7.5 STRW (GLOVE) ×2
GLOVE SURG LX STRL 7.5 STRW (GLOVE) ×1 IMPLANT
GLOVE SURG SYN 8.5  E (GLOVE) ×2
GLOVE SURG SYN 8.5 E (GLOVE) ×1 IMPLANT
GLOVE SURG SYN 8.5 PF PI (GLOVE) ×1 IMPLANT
GOWN STRL REUS W/ TWL LRG LVL3 (GOWN DISPOSABLE) ×2 IMPLANT
GOWN STRL REUS W/TWL LRG LVL3 (GOWN DISPOSABLE) ×6
LENS IOL TECNIS ITEC 15.5 (Intraocular Lens) ×2 IMPLANT
MARKER SKIN DUAL TIP RULER LAB (MISCELLANEOUS) ×3 IMPLANT
PACK DR. KING ARMS (PACKS) ×3 IMPLANT
PACK EYE AFTER SURG (MISCELLANEOUS) ×3 IMPLANT
PACK OPTHALMIC (MISCELLANEOUS) ×3 IMPLANT
SYR 3ML LL SCALE MARK (SYRINGE) ×3 IMPLANT
SYR TB 1ML LUER SLIP (SYRINGE) ×3 IMPLANT
WATER STERILE IRR 500ML POUR (IV SOLUTION) ×3 IMPLANT
WIPE NON LINTING 3.25X3.25 (MISCELLANEOUS) ×3 IMPLANT

## 2018-08-07 NOTE — Anesthesia Postprocedure Evaluation (Signed)
Anesthesia Post Note  Patient: [name]  Procedure(s) Performed: CATARACT EXTRACTION PHACO AND INTRAOCULAR LENS PLACEMENT (IOC)  RIGHT DIABETIC (Right Eye)  Patient location during evaluation: PACU Anesthesia Type: MAC Level of consciousness: awake Pain management: pain level controlled Vital Signs Assessment: post-procedure vital signs reviewed and stable Respiratory status: spontaneous breathing Cardiovascular status: blood pressure returned to baseline Postop Assessment: no headache Anesthetic complications: no    Lavonna Monarch

## 2018-08-07 NOTE — Anesthesia Preprocedure Evaluation (Addendum)
Anesthesia Evaluation  Patient identified by MRN, date of birth, ID band Patient awake    Reviewed: Allergy & Precautions, NPO status , Patient's Chart, lab work & pertinent test results, reviewed documented beta blocker date and time   Airway Mallampati: I  TM Distance: >3 FB Neck ROM: Full    Dental no notable dental hx.    Pulmonary sleep apnea ,    Pulmonary exam normal breath sounds clear to auscultation       Cardiovascular hypertension, Normal cardiovascular exam Rhythm:Regular Rate:Normal     Neuro/Psych PSYCHIATRIC DISORDERS Depression  Neuromuscular disease CVA    GI/Hepatic GERD  ,  Endo/Other  diabetesHypothyroidism   Renal/GU CRFRenal disease     Musculoskeletal  (+) Arthritis , Fibromyalgia -Psoriatic   Abdominal Normal abdominal exam  (+)   Peds  Hematology   Anesthesia Other Findings   Reproductive/Obstetrics                            Anesthesia Physical Anesthesia Plan  ASA: III  Anesthesia Plan: MAC   Post-op Pain Management:    Induction: Intravenous  PONV Risk Score and Plan:   Airway Management Planned: Natural Airway  Additional Equipment: None  Intra-op Plan:   Post-operative Plan:   Informed Consent: I have reviewed the patients History and Physical, chart, labs and discussed the procedure including the risks, benefits and alternatives for the proposed anesthesia with the patient or authorized representative who has indicated his/her understanding and acceptance.     Plan Discussed with: Anesthesiologist, Surgeon and CRNA  Anesthesia Plan Comments:         Anesthesia Quick Evaluation

## 2018-08-07 NOTE — H&P (Signed)
The History and Physical notes are on paper, have been signed, and are to be scanned.   I have examined the patient and there are no changes to the H&P.   Benay Pillow 08/07/2018 7:18 AM

## 2018-08-07 NOTE — Op Note (Signed)
OPERATIVE NOTE  [name] [id number] 08/07/2018   PREOPERATIVE DIAGNOSIS:  Nuclear sclerotic cataract right eye.  H25.11   POSTOPERATIVE DIAGNOSIS:    Nuclear sclerotic cataract right eye.     PROCEDURE:  Phacoemusification with posterior chamber intraocular lens placement of the right eye   LENS:   Implant Name Type Inv. Item Serial No. Manufacturer Lot No. LRB No. Used  LENS IOL DIOP 15.5 - [id number] Intraocular Lens LENS IOL DIOP 15.5 [id number]  Right 1       PCB00 +15.5   ULTRASOUND TIME: 0 minutes 34 seconds.  CDE 3.91   SURGEON:  Benay Pillow, MD, MPH  ANESTHESIOLOGIST: Anesthesiologist: Lavonna Monarch, MD CRNA: Jeannene Patella, CRNA   ANESTHESIA:  Topical with tetracaine drops augmented with 1% preservative-free intracameral lidocaine.  ESTIMATED BLOOD LOSS: less than 1 mL.   COMPLICATIONS:  None.   DESCRIPTION OF PROCEDURE:  The patient was identified in the holding room and transported to the operating room and placed in the supine position under the operating microscope.  The right eye was identified as the operative eye and it was prepped and draped in the usual sterile ophthalmic fashion.   A 1.0 millimeter clear-corneal paracentesis was made at the 10:30 position. 0.5 ml of preservative-free 1% lidocaine with epinephrine was injected into the anterior chamber.  The anterior chamber was filled with Healon 5 viscoelastic.  A 2.4 millimeter keratome was used to make a near-clear corneal incision at the 8:00 position.  A curvilinear capsulorrhexis was made with a cystotome and capsulorrhexis forceps.  Balanced salt solution was used to hydrodissect and hydrodelineate the nucleus.   Phacoemulsification was then used in stop and chop fashion to remove the lens nucleus and epinucleus.  The remaining cortex was then removed using the irrigation and aspiration handpiece. Healon was then placed into the capsular bag to distend it for lens placement.  A lens was  then injected into the capsular bag.  The remaining viscoelastic was aspirated.   Wounds were hydrated with balanced salt solution.  The anterior chamber was inflated to a physiologic pressure with balanced salt solution.   Intracameral vigamox 0.1 mL undiluted was injected into the eye and a drop placed onto the ocular surface.  No wound leaks were noted.  The patient was taken to the recovery room in stable condition without complications of anesthesia or surgery  Benay Pillow 08/07/2018, 7:58 AM

## 2018-08-07 NOTE — Transfer of Care (Signed)
Immediate Anesthesia Transfer of Care Note  Patient: [name]  Procedure(s) Performed: CATARACT EXTRACTION PHACO AND INTRAOCULAR LENS PLACEMENT (IOC)  RIGHT DIABETIC (Right Eye)  Patient Location: PACU  Anesthesia Type: MAC  Level of Consciousness: awake, alert  and patient cooperative  Airway and Oxygen Therapy: Patient Spontanous Breathing and Patient connected to supplemental oxygen  Post-op Assessment: Post-op Vital signs reviewed, Patient's Cardiovascular Status Stable, Respiratory Function Stable, Patent Airway and No signs of Nausea or vomiting  Post-op Vital Signs: Reviewed and stable  Complications: No apparent anesthesia complications

## 2018-08-07 NOTE — Anesthesia Procedure Notes (Signed)
Procedure Name: MAC Date/Time: 08/07/2018 7:34 AM Performed by: Jeannene Patella, CRNA Pre-anesthesia Checklist: Patient identified, Emergency Drugs available, Suction available, Patient being monitored and Timeout performed Patient Re-evaluated:Patient Re-evaluated prior to induction Oxygen Delivery Method: Nasal cannula Induction Type: IV induction

## 2018-08-08 ENCOUNTER — Encounter: Payer: Self-pay | Admitting: Ophthalmology

## 2018-10-02 ENCOUNTER — Encounter: Payer: Self-pay | Admitting: *Deleted

## 2018-10-02 ENCOUNTER — Other Ambulatory Visit: Payer: Self-pay

## 2018-10-05 NOTE — Discharge Instructions (Signed)

## 2018-10-09 ENCOUNTER — Ambulatory Visit: Payer: Medicare Other | Admitting: Anesthesiology

## 2018-10-09 ENCOUNTER — Other Ambulatory Visit: Payer: Self-pay

## 2018-10-09 ENCOUNTER — Ambulatory Visit
Admission: RE | Admit: 2018-10-09 | Discharge: 2018-10-09 | Disposition: A | Discharge status: Home / Self Care | Payer: Medicare Other | Attending: Ophthalmology | Admitting: Ophthalmology

## 2018-10-09 ENCOUNTER — Encounter
Admission: RE | Disposition: A | Discharge status: Home / Self Care | Payer: Self-pay | Source: Home / Self Care | Attending: Ophthalmology

## 2018-10-09 ENCOUNTER — Encounter: Payer: Self-pay | Admitting: *Deleted

## 2018-10-09 DIAGNOSIS — G4730 Sleep apnea, unspecified: Secondary | ICD-10-CM | POA: Insufficient documentation

## 2018-10-09 DIAGNOSIS — I129 Hypertensive chronic kidney disease with stage 1 through stage 4 chronic kidney disease, or unspecified chronic kidney disease: Secondary | ICD-10-CM | POA: Diagnosis not present

## 2018-10-09 DIAGNOSIS — F329 Major depressive disorder, single episode, unspecified: Secondary | ICD-10-CM | POA: Insufficient documentation

## 2018-10-09 DIAGNOSIS — N183 Chronic kidney disease, stage 3 (moderate): Secondary | ICD-10-CM | POA: Insufficient documentation

## 2018-10-09 DIAGNOSIS — E1122 Type 2 diabetes mellitus with diabetic chronic kidney disease: Secondary | ICD-10-CM | POA: Insufficient documentation

## 2018-10-09 DIAGNOSIS — Z79899 Other long term (current) drug therapy: Secondary | ICD-10-CM | POA: Insufficient documentation

## 2018-10-09 DIAGNOSIS — M797 Fibromyalgia: Secondary | ICD-10-CM | POA: Diagnosis not present

## 2018-10-09 DIAGNOSIS — H2512 Age-related nuclear cataract, left eye: Secondary | ICD-10-CM | POA: Diagnosis present

## 2018-10-09 DIAGNOSIS — E7800 Pure hypercholesterolemia, unspecified: Secondary | ICD-10-CM | POA: Insufficient documentation

## 2018-10-09 DIAGNOSIS — Z79890 Hormone replacement therapy (postmenopausal): Secondary | ICD-10-CM | POA: Insufficient documentation

## 2018-10-09 DIAGNOSIS — Z7982 Long term (current) use of aspirin: Secondary | ICD-10-CM | POA: Insufficient documentation

## 2018-10-09 DIAGNOSIS — K219 Gastro-esophageal reflux disease without esophagitis: Secondary | ICD-10-CM | POA: Insufficient documentation

## 2018-10-09 DIAGNOSIS — Z7984 Long term (current) use of oral hypoglycemic drugs: Secondary | ICD-10-CM | POA: Diagnosis not present

## 2018-10-09 DIAGNOSIS — E1136 Type 2 diabetes mellitus with diabetic cataract: Secondary | ICD-10-CM | POA: Diagnosis not present

## 2018-10-09 DIAGNOSIS — Z8673 Personal history of transient ischemic attack (TIA), and cerebral infarction without residual deficits: Secondary | ICD-10-CM | POA: Insufficient documentation

## 2018-10-09 HISTORY — PX: CATARACT EXTRACTION W/PHACO: SHX586

## 2018-10-09 LAB — GLUCOSE, CAPILLARY: GLUCOSE-CAPILLARY: 186 mg/dL — AB (ref 70–99)

## 2018-10-09 SURGERY — PHACOEMULSIFICATION, CATARACT, WITH IOL INSERTION
Anesthesia: Monitor Anesthesia Care | Laterality: Left

## 2018-10-09 MED ORDER — EPINEPHRINE PF 1 MG/ML IJ SOLN
INTRAOCULAR | Status: DC | PRN
  Administered 2018-10-09: 55 mL via OPHTHALMIC

## 2018-10-09 MED ORDER — ACETAMINOPHEN 325 MG PO TABS: 325.0000 mg | ORAL_TABLET | ORAL | Status: DC | PRN

## 2018-10-09 MED ORDER — ARMC OPHTHALMIC DILATING DROPS
1.0000 | OPHTHALMIC | Status: DC | PRN
Start: 2018-10-09 — End: 2018-10-09
  Administered 2018-10-09 (×2): 1 via OPHTHALMIC

## 2018-10-09 MED ORDER — SODIUM HYALURONATE 23 MG/ML IO SOLN
INTRAOCULAR | Status: DC | PRN
  Administered 2018-10-09: 0.6 mL via INTRAOCULAR

## 2018-10-09 MED ORDER — MOXIFLOXACIN HCL 0.5 % OP SOLN
OPHTHALMIC | Status: DC | PRN
  Administered 2018-10-09: 0.2 mL via OPHTHALMIC

## 2018-10-09 MED ORDER — LACTATED RINGERS IV SOLN: INTRAVENOUS | Status: DC

## 2018-10-09 MED ORDER — MIDAZOLAM HCL 2 MG/2ML IJ SOLN
INTRAMUSCULAR | Status: DC | PRN
  Administered 2018-10-09 (×2): 1 mg via INTRAVENOUS

## 2018-10-09 MED ORDER — ACETAMINOPHEN 160 MG/5ML PO SOLN: 325.0000 mg | ORAL | Status: DC | PRN

## 2018-10-09 MED ORDER — TETRACAINE HCL 0.5 % OP SOLN
1.0000 [drp] | OPHTHALMIC | Status: DC | PRN
  Administered 2018-10-09 (×3): 1 [drp] via OPHTHALMIC

## 2018-10-09 MED ORDER — LIDOCAINE HCL (PF) 2 % IJ SOLN
INTRAOCULAR | Status: DC | PRN
  Administered 2018-10-09: 1 mL via INTRAOCULAR

## 2018-10-09 MED ORDER — FENTANYL CITRATE (PF) 100 MCG/2ML IJ SOLN
INTRAMUSCULAR | Status: DC | PRN
  Administered 2018-10-09: 50 ug via INTRAVENOUS

## 2018-10-09 MED ORDER — SODIUM HYALURONATE 10 MG/ML IO SOLN
INTRAOCULAR | Status: DC | PRN
  Administered 2018-10-09: 0.55 mL via INTRAOCULAR

## 2018-10-09 MED ORDER — ONDANSETRON HCL 4 MG/2ML IJ SOLN: 4.0000 mg | Freq: Once | INTRAMUSCULAR | Status: DC | PRN

## 2018-10-09 SURGICAL SUPPLY — 19 items
CANNULA ANT/CHMB 27G (MISCELLANEOUS) ×2 IMPLANT
CANNULA ANT/CHMB 27GA (MISCELLANEOUS) ×6 IMPLANT
DISSECTOR HYDRO NUCLEUS 50X22 (MISCELLANEOUS) ×3 IMPLANT
GLOVE SURG LX 7.5 STRW (GLOVE) ×2
GLOVE SURG LX STRL 7.5 STRW (GLOVE) ×1 IMPLANT
GLOVE SURG SYN 8.5  E (GLOVE) ×2
GLOVE SURG SYN 8.5 E (GLOVE) ×1 IMPLANT
GLOVE SURG SYN 8.5 PF PI (GLOVE) ×1 IMPLANT
GOWN STRL REUS W/ TWL LRG LVL3 (GOWN DISPOSABLE) ×2 IMPLANT
GOWN STRL REUS W/TWL LRG LVL3 (GOWN DISPOSABLE) ×6
LENS IOL TECNIS ITEC 15.0 (Intraocular Lens) ×2 IMPLANT
MARKER SKIN DUAL TIP RULER LAB (MISCELLANEOUS) ×3 IMPLANT
PACK DR. KING ARMS (PACKS) ×3 IMPLANT
PACK EYE AFTER SURG (MISCELLANEOUS) ×3 IMPLANT
PACK OPTHALMIC (MISCELLANEOUS) ×3 IMPLANT
SYR 3ML LL SCALE MARK (SYRINGE) ×3 IMPLANT
SYR TB 1ML LUER SLIP (SYRINGE) ×3 IMPLANT
WATER STERILE IRR 500ML POUR (IV SOLUTION) ×3 IMPLANT
WIPE NON LINTING 3.25X3.25 (MISCELLANEOUS) ×3 IMPLANT

## 2018-10-09 NOTE — Anesthesia Procedure Notes (Signed)
Procedure Name: MAC Date/Time: 10/09/2018 10:10 AM Performed by: Cameron Ali, CRNA Pre-anesthesia Checklist: Patient identified, Emergency Drugs available, Suction available, Timeout performed and Patient being monitored Patient Re-evaluated:Patient Re-evaluated prior to induction Oxygen Delivery Method: Nasal cannula Placement Confirmation: positive ETCO2

## 2018-10-09 NOTE — Anesthesia Preprocedure Evaluation (Signed)
Anesthesia Evaluation  Patient identified by MRN, date of birth, ID band Patient awake    Reviewed: Allergy & Precautions, NPO status , Patient's Chart, lab work & pertinent test results, reviewed documented beta blocker date and time   Airway Mallampati: I  TM Distance: >3 FB Neck ROM: Full    Dental no notable dental hx.    Pulmonary sleep apnea ,    Pulmonary exam normal breath sounds clear to auscultation       Cardiovascular hypertension, Normal cardiovascular exam Rhythm:Regular Rate:Normal     Neuro/Psych PSYCHIATRIC DISORDERS Depression  Neuromuscular disease CVA    GI/Hepatic GERD  ,  Endo/Other  diabetesHypothyroidism   Renal/GU CRFRenal disease     Musculoskeletal  (+) Arthritis , Fibromyalgia -Psoriatic   Abdominal Normal abdominal exam  (+)   Peds  Hematology   Anesthesia Other Findings   Reproductive/Obstetrics                            Anesthesia Physical Anesthesia Plan  ASA: III  Anesthesia Plan: MAC   Post-op Pain Management:    Induction: Intravenous  PONV Risk Score and Plan:   Airway Management Planned: Natural Airway  Additional Equipment: None  Intra-op Plan:   Post-operative Plan:   Informed Consent: I have reviewed the patients History and Physical, chart, labs and discussed the procedure including the risks, benefits and alternatives for the proposed anesthesia with the patient or authorized representative who has indicated his/her understanding and acceptance.     Plan Discussed with: Anesthesiologist, Surgeon and CRNA  Anesthesia Plan Comments:         Anesthesia Quick Evaluation  

## 2018-10-09 NOTE — Transfer of Care (Signed)
Immediate Anesthesia Transfer of Care Note  Patient: [name]  Procedure(s) Performed: CATARACT EXTRACTION PHACO AND INTRAOCULAR LENS PLACEMENT (IOC) LEFT IVA TOPICAL DIABETES (Left )  Patient Location: PACU  Anesthesia Type: MAC  Level of Consciousness: awake, alert  and patient cooperative  Airway and Oxygen Therapy: Patient Spontanous Breathing and Patient connected to supplemental oxygen  Post-op Assessment: Post-op Vital signs reviewed, Patient's Cardiovascular Status Stable, Respiratory Function Stable, Patent Airway and No signs of Nausea or vomiting  Post-op Vital Signs: Reviewed and stable  Complications: No apparent anesthesia complications

## 2018-10-09 NOTE — Op Note (Signed)
OPERATIVE NOTE  [name] [id number] 10/09/2018   PREOPERATIVE DIAGNOSIS:  Nuclear sclerotic cataract left eye.  H25.12   POSTOPERATIVE DIAGNOSIS:    Nuclear sclerotic cataract left eye.     PROCEDURE:  Phacoemusification with posterior chamber intraocular lens placement of the left eye   LENS:   Implant Name Type Inv. Item Serial No. Manufacturer Lot No. LRB No. Used  LENS IOL DIOP 15.0 - [id number] Intraocular Lens LENS IOL DIOP 15.0 [id number] AMO  Left 1       PCB00 +15.0   ULTRASOUND TIME: 0 minutes 41 seconds.  CDE 3.94   SURGEON:  Benay Pillow, MD, MPH   ANESTHESIA:  Topical with tetracaine drops augmented with 1% preservative-free intracameral lidocaine.  ESTIMATED BLOOD LOSS: <1 mL   COMPLICATIONS:  None.   DESCRIPTION OF PROCEDURE:  The patient was identified in the holding room and transported to the operating room and placed in the supine position under the operating microscope.  The left eye was identified as the operative eye and it was prepped and draped in the usual sterile ophthalmic fashion.   A 1.0 millimeter clear-corneal paracentesis was made at the 5:00 position. 0.5 ml of preservative-free 1% lidocaine with epinephrine was injected into the anterior chamber.  The anterior chamber was filled with Healon 5 viscoelastic.  A 2.4 millimeter keratome was used to make a near-clear corneal incision at the 2:00 position.  A curvilinear capsulorrhexis was made with a cystotome and capsulorrhexis forceps.  Balanced salt solution was used to hydrodissect and hydrodelineate the nucleus.   Phacoemulsification was then used in stop and chop fashion to remove the lens nucleus and epinucleus.  The remaining cortex was then removed using the irrigation and aspiration handpiece. Healon was then placed into the capsular bag to distend it for lens placement.  A lens was then injected into the capsular bag.  The remaining viscoelastic was aspirated.   Wounds were hydrated  with balanced salt solution.  The anterior chamber was inflated to a physiologic pressure with balanced salt solution.  Intracameral vigamox 0.1 mL undiltued was injected into the eye and a drop placed onto the ocular surface.  No wound leaks were noted.  The patient was taken to the recovery room in stable condition without complications of anesthesia or surgery  Benay Pillow 10/09/2018, 10:30 AM

## 2018-10-09 NOTE — H&P (Signed)

## 2018-10-09 NOTE — Anesthesia Postprocedure Evaluation (Signed)
Anesthesia Post Note  Patient: [name]  Procedure(s) Performed: CATARACT EXTRACTION PHACO AND INTRAOCULAR LENS PLACEMENT (IOC) LEFT IVA TOPICAL DIABETES (Left )  Patient location during evaluation: PACU Anesthesia Type: MAC Level of consciousness: awake and alert Pain management: pain level controlled Vital Signs Assessment: post-procedure vital signs reviewed and stable Respiratory status: spontaneous breathing, nonlabored ventilation, respiratory function stable and patient connected to nasal cannula oxygen Cardiovascular status: stable and blood pressure returned to baseline Postop Assessment: no apparent nausea or vomiting Anesthetic complications: no    Alisa Graff

## 2018-10-10 ENCOUNTER — Encounter: Payer: Self-pay | Admitting: Ophthalmology

## 2018-12-13 IMAGING — CR DG THORACIC SPINE 2V
3 series · 3 of 3 positions shown · non-contrast
Comparison: 05/03/2012

CLINICAL DATA: Fall in bathroom this morning. Thoracic back pain.
Initial encounter.

EXAM:
THORACIC SPINE 2 VIEWS

[t-spine ap]
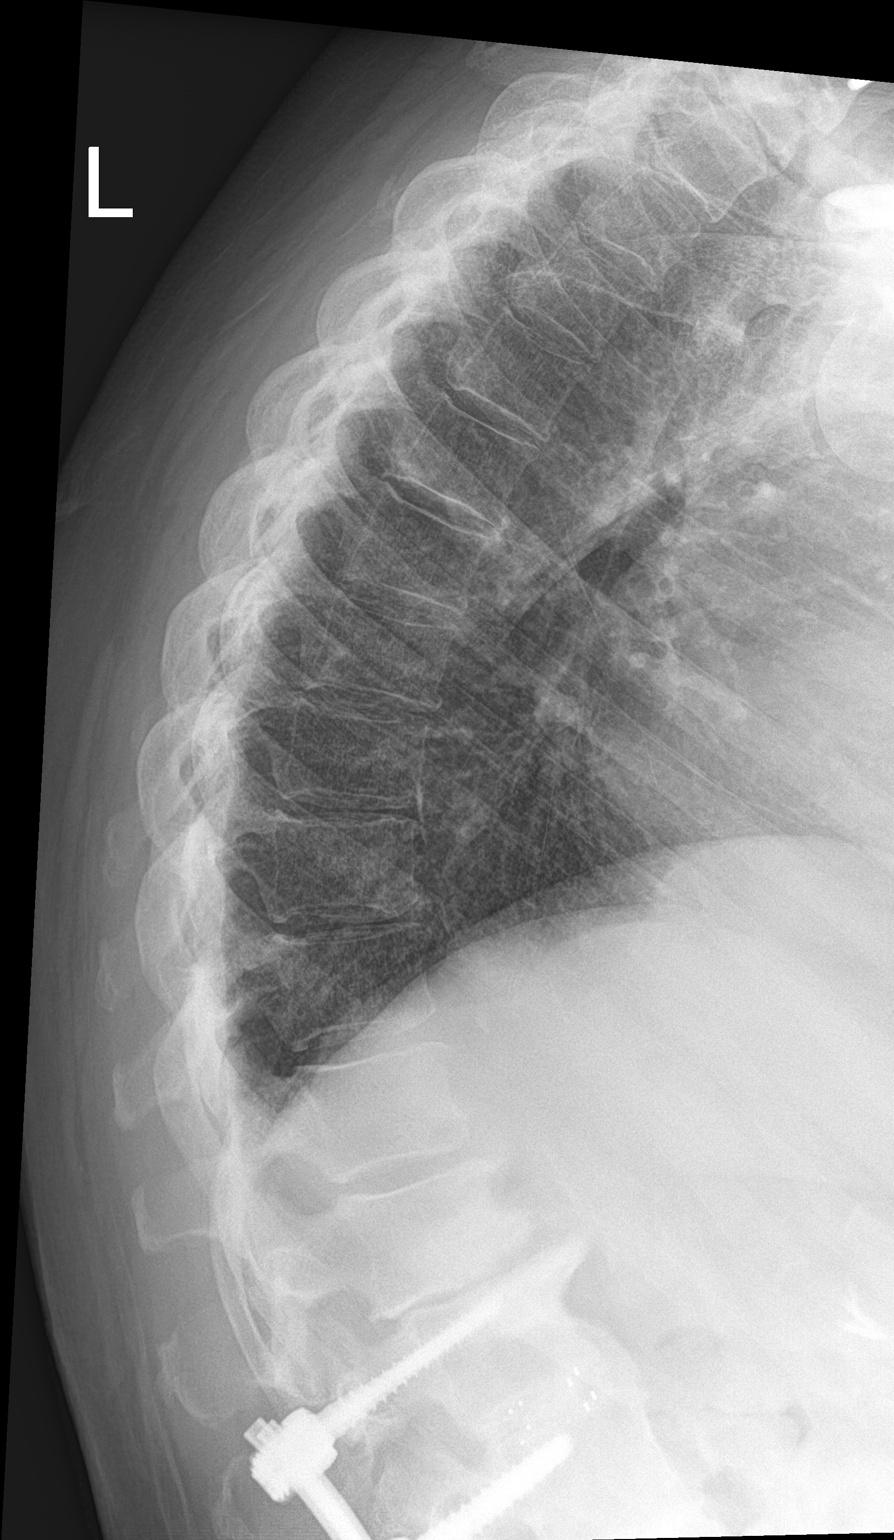

[t-spine lat]
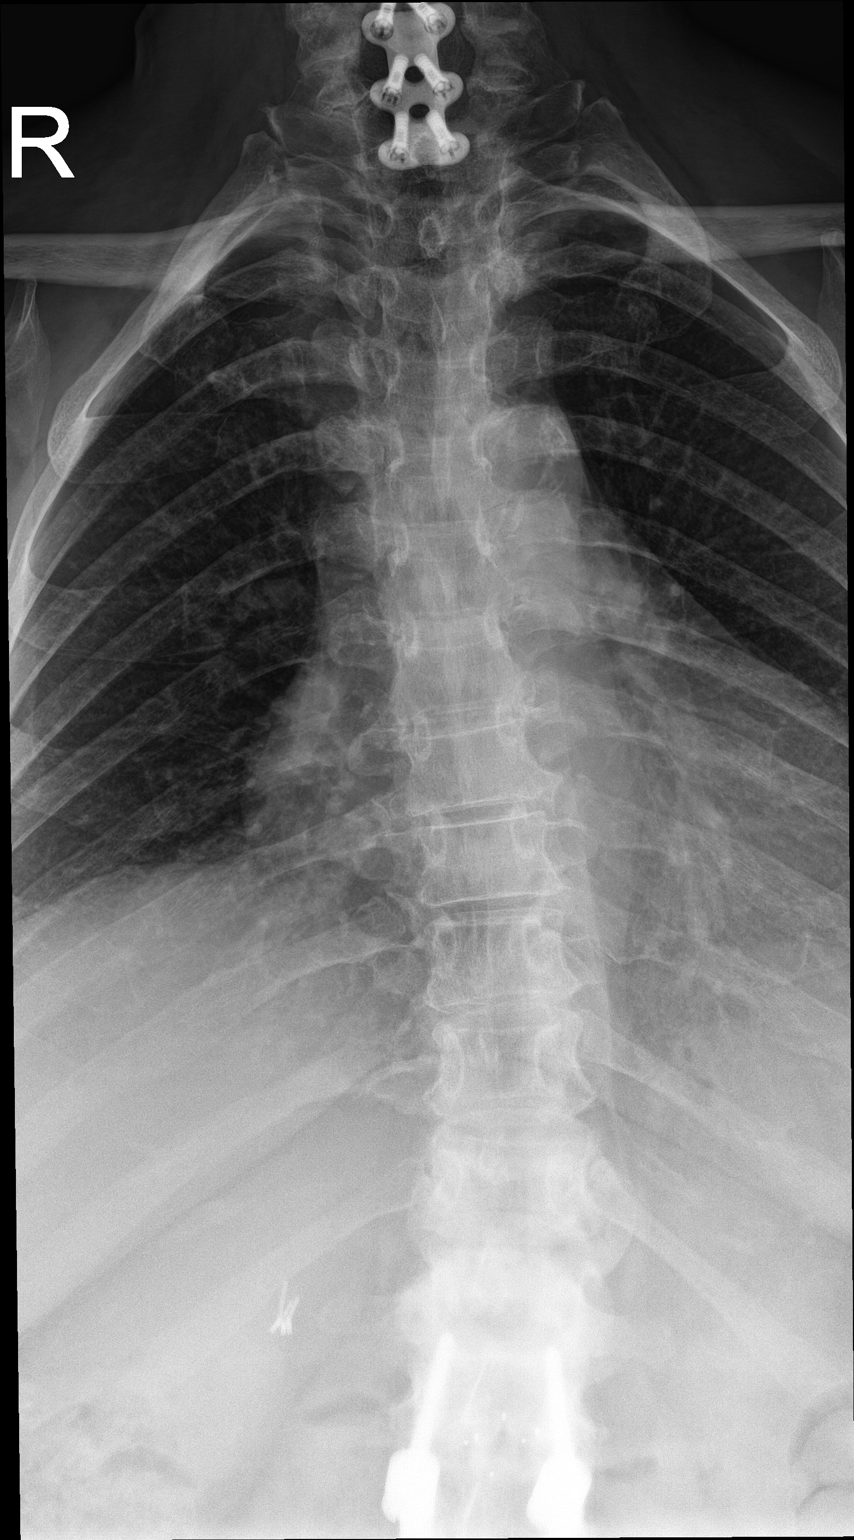

[t-spine swimmers]
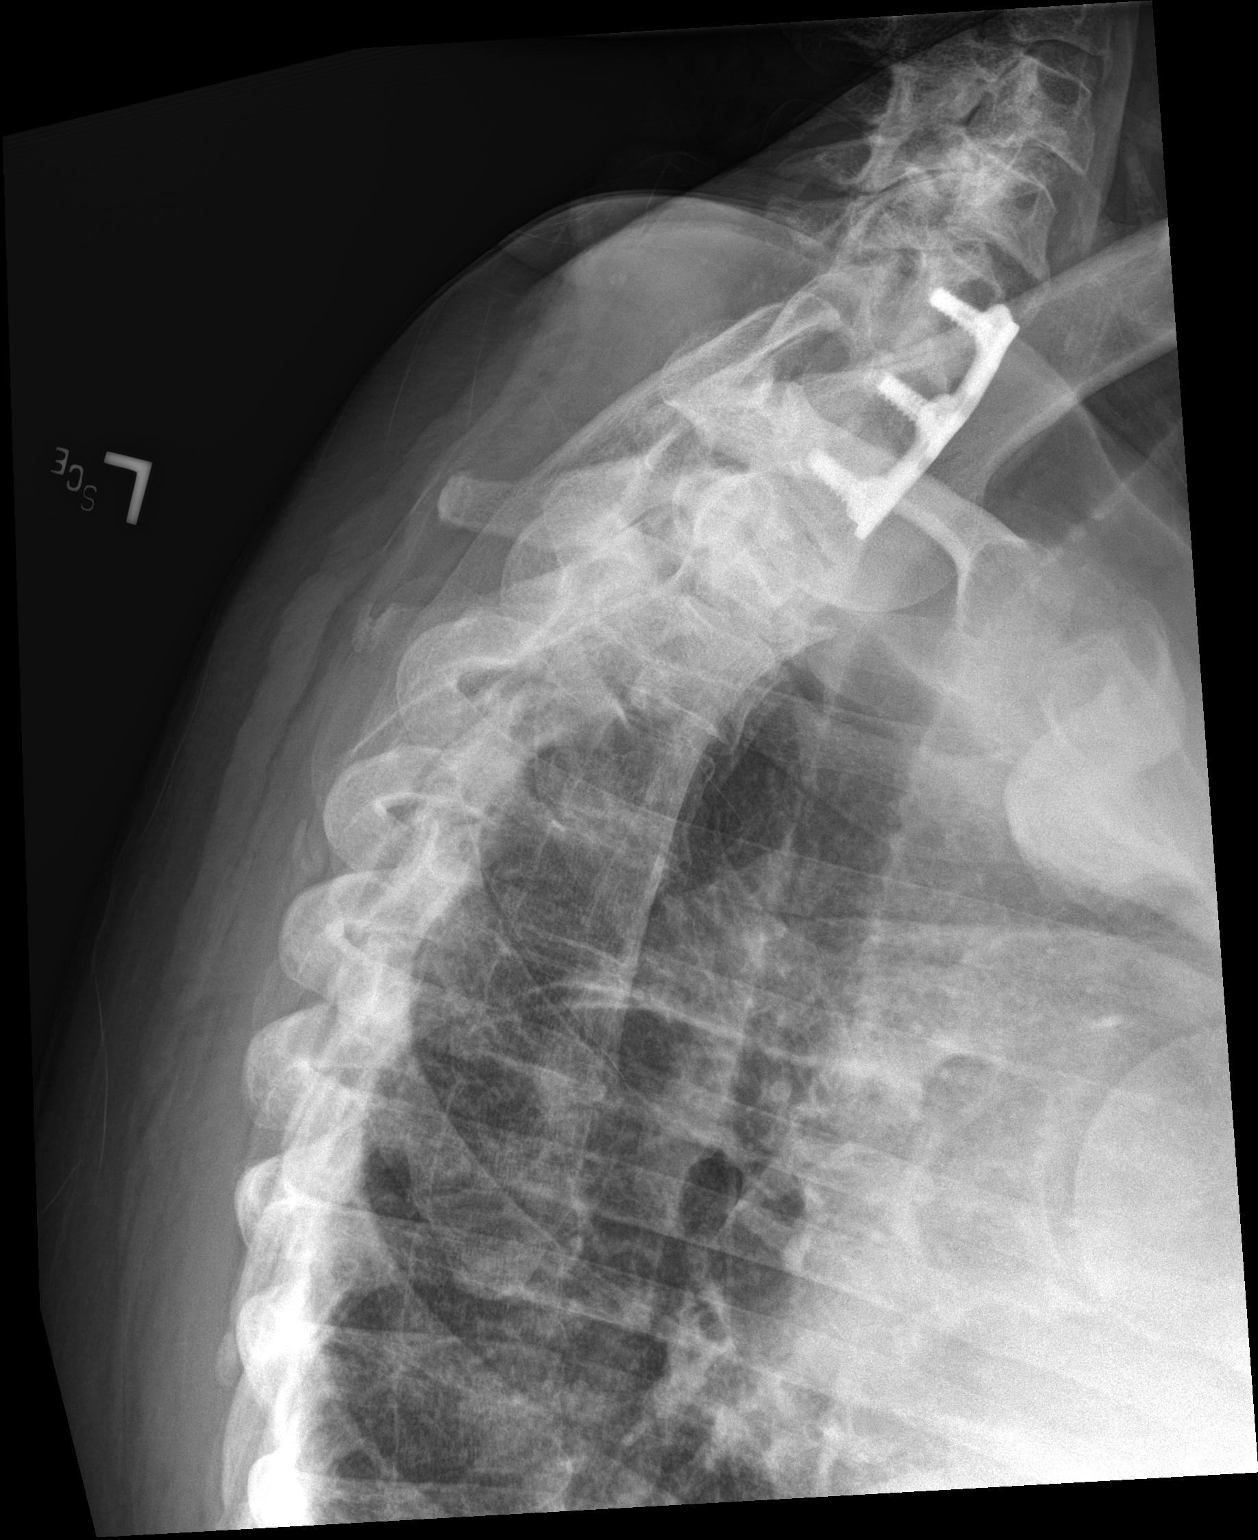

[3 of 3 positions shown; findings below may reference images not displayed]

FINDINGS: There is no evidence of thoracic spine fracture. Alignment is
normal. No lytic or sclerotic bone lesions identified. Lumbar and
cervical spine fusion hardware noted. Severe degenerative disc
disease at L1-2.
IMPRESSION: No acute findings.

## 2020-01-16 IMAGING — US US EXTREM LOW VENOUS*L*
1 series · 13 of 24 positions shown · non-contrast
Comparison: None.

CLINICAL DATA: 72-year-old female with left foot and ankle swelling
for the past 2 days.



[Series 1: us extrem low venous*left* · 13 of 34 slices shown]
[im 1/34]
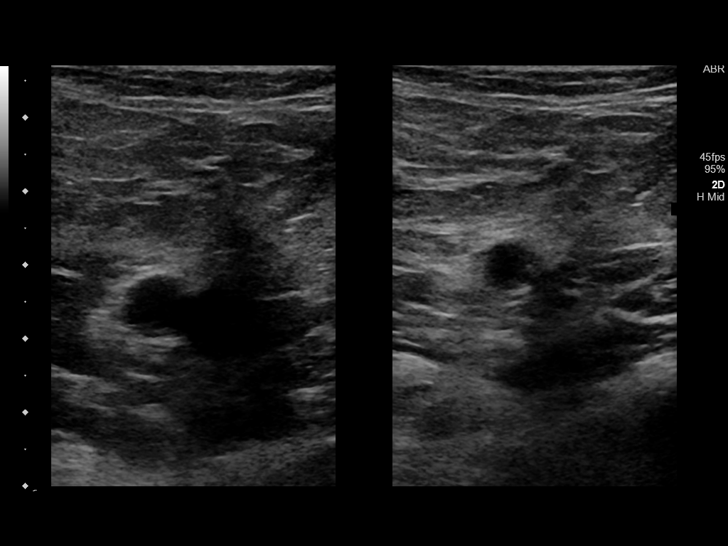
[im 3/34]
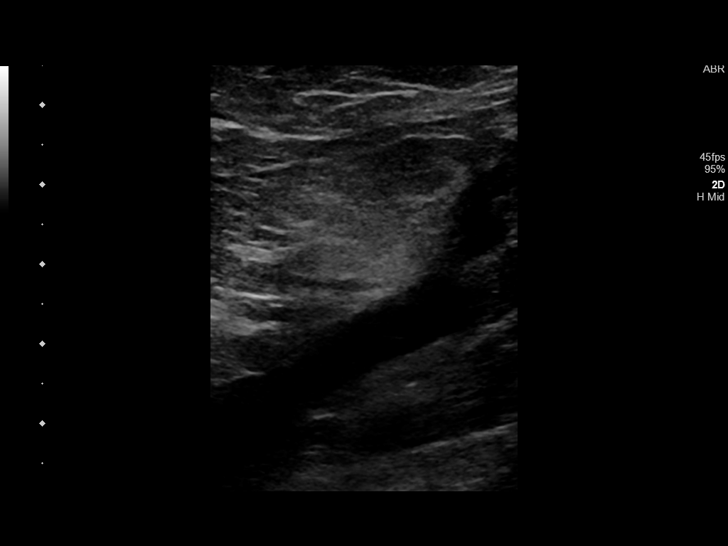
[im 6/34]
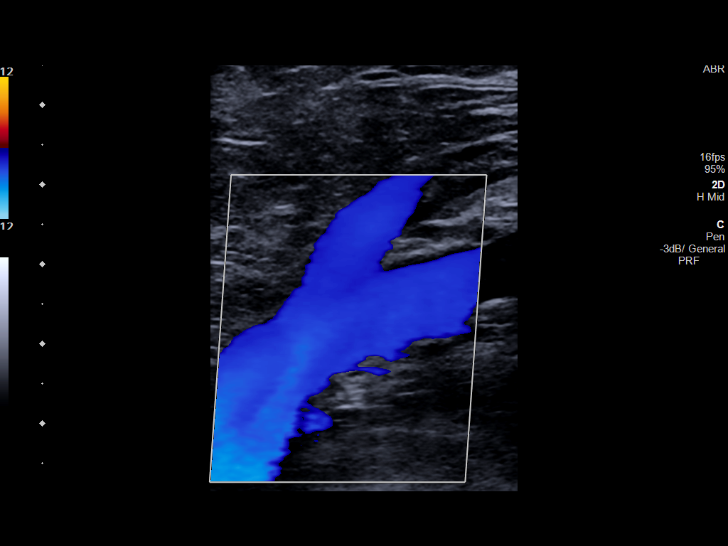
[im 9/34]
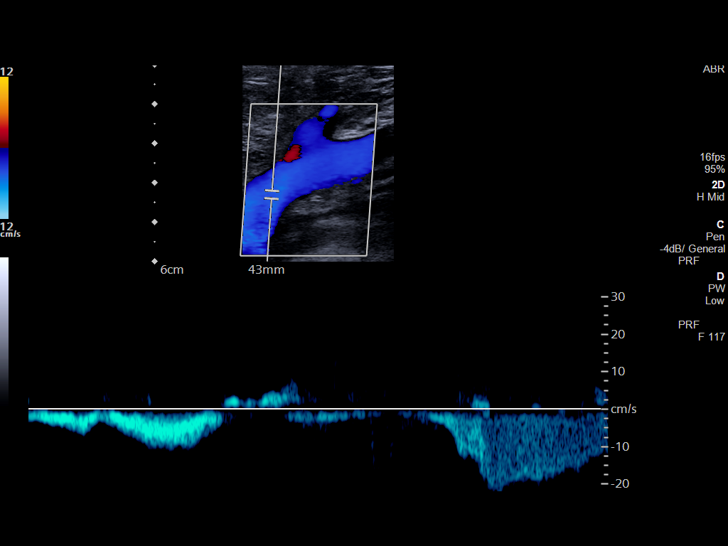
[im 12/34]
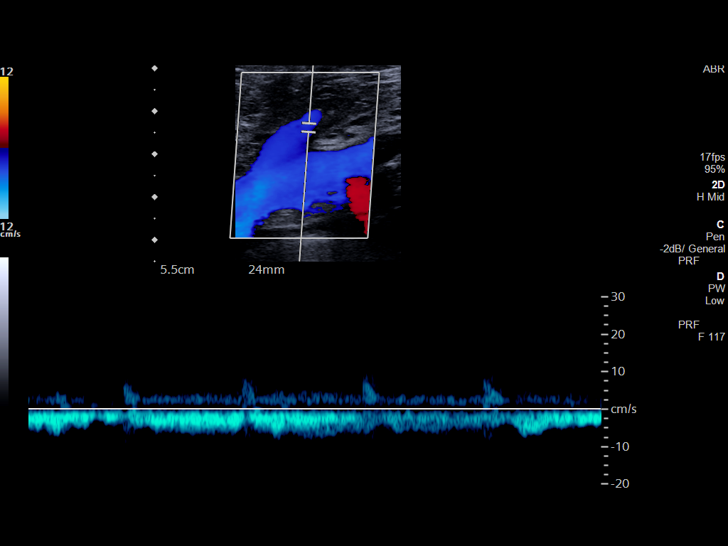
[im 15/34]
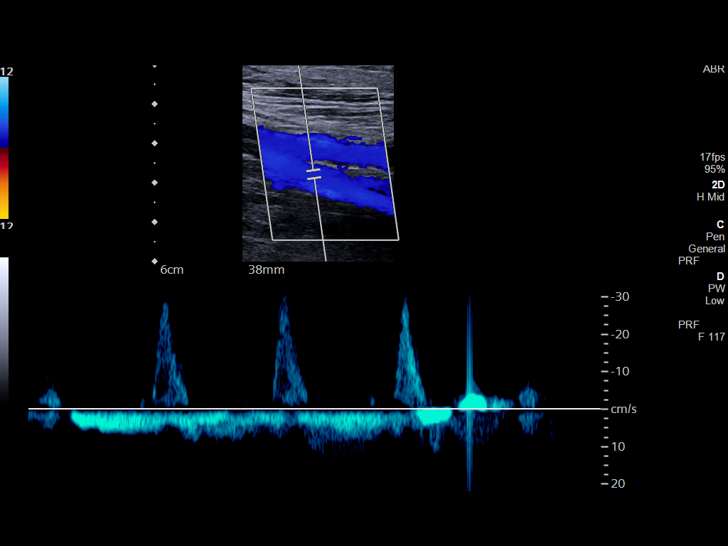
[im 18/34]
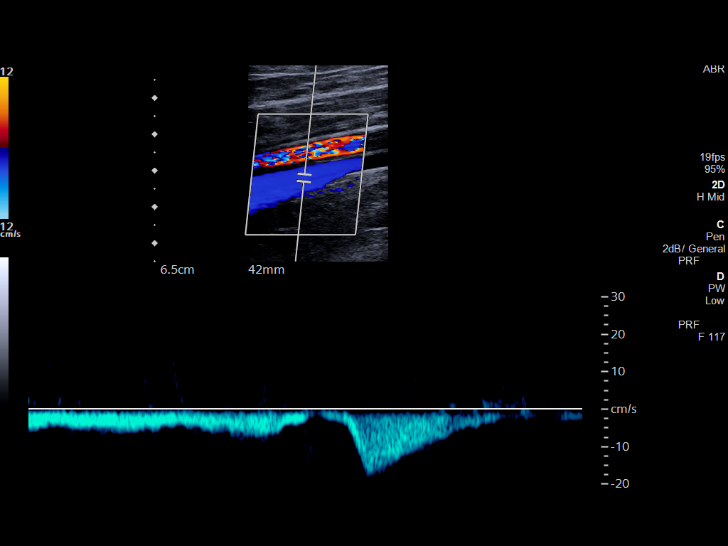
[im 19/34]
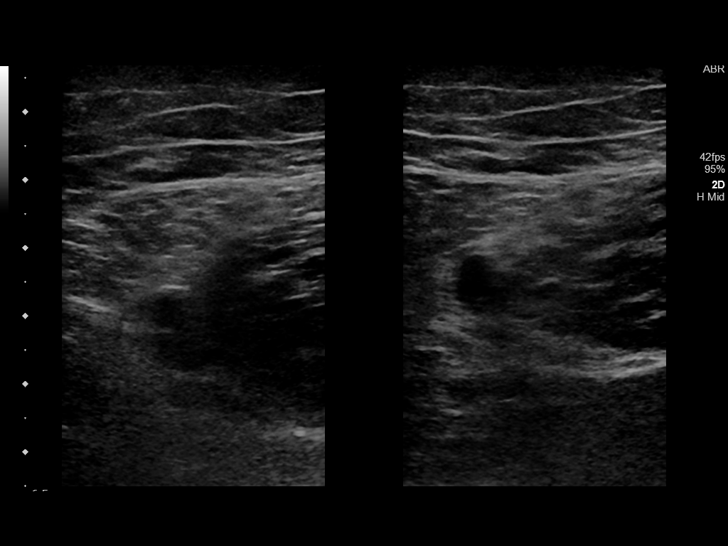
[im 22/34]
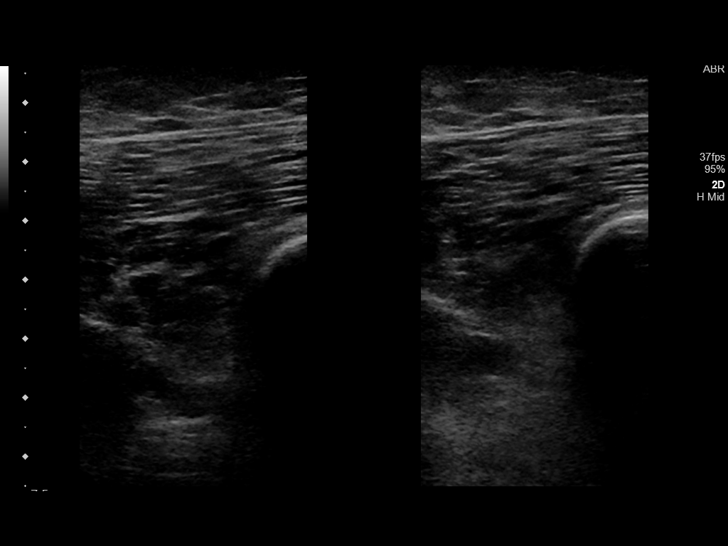
[im 25/34]
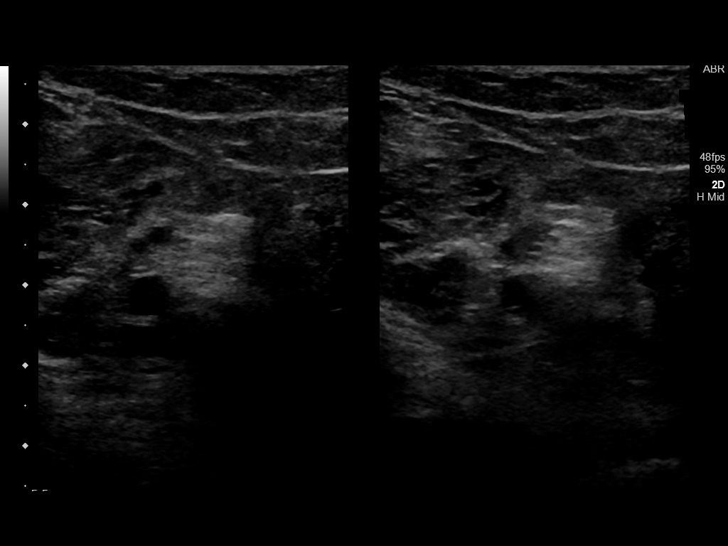
[im 28/34]
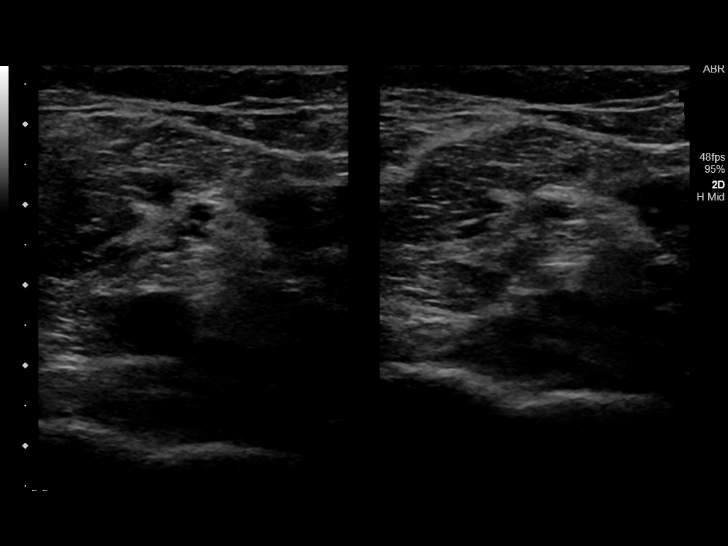
[im 31/34]
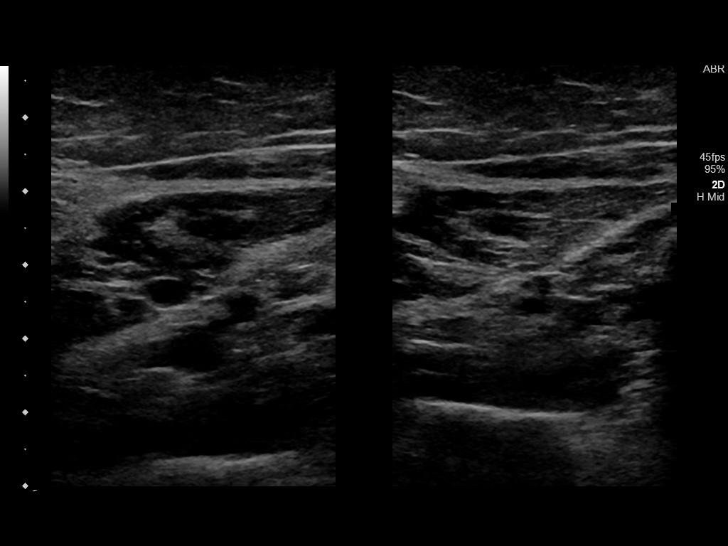
[im 34/34]
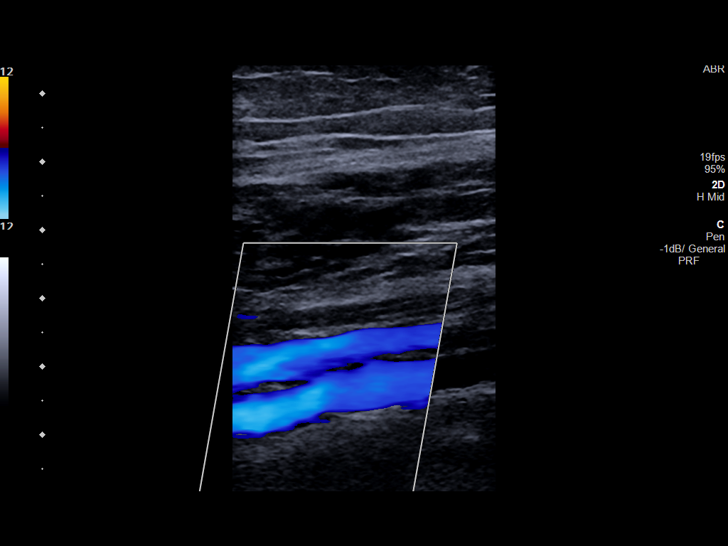

[13 of 24 positions shown; findings below may reference images not displayed]

FINDINGS: Contralateral Common Femoral Vein: Respiratory phasicity is normal
and symmetric with the symptomatic side. No evidence of thrombus.
Normal compressibility.

Common Femoral Vein: No evidence of thrombus. Normal
compressibility, respiratory phasicity and response to augmentation.

Saphenofemoral Junction: No evidence of thrombus. Normal
compressibility and flow on color Doppler imaging.

Profunda Femoral Vein: No evidence of thrombus. Normal
compressibility and flow on color Doppler imaging.

Femoral Vein: No evidence of thrombus. Normal compressibility,
respiratory phasicity and response to augmentation.

Popliteal Vein: No evidence of thrombus. Normal compressibility,
respiratory phasicity and response to augmentation.

Calf Veins: No evidence of thrombus. Normal compressibility and flow
on color Doppler imaging.

Superficial Great Saphenous Vein: No evidence of thrombus. Normal
compressibility.

Venous Reflux:  None.

Other Findings:  None.
IMPRESSION: No evidence of deep venous thrombosis.

## 2020-04-02 ENCOUNTER — Other Ambulatory Visit: Payer: Self-pay

## 2020-04-02 ENCOUNTER — Encounter: Payer: Self-pay | Admitting: Emergency Medicine

## 2020-04-02 ENCOUNTER — Ambulatory Visit
Admission: EM | Admit: 2020-04-02 | Discharge: 2020-04-02 | Disposition: A | Discharge status: Home / Self Care | Payer: Medicare Other | Attending: Family Medicine | Admitting: Family Medicine

## 2020-04-02 DIAGNOSIS — N10 Acute tubulo-interstitial nephritis: Secondary | ICD-10-CM | POA: Diagnosis not present

## 2020-04-02 LAB — URINALYSIS, COMPLETE (UACMP) WITH MICROSCOPIC
Bilirubin Urine: NEGATIVE
Glucose, UA: NEGATIVE mg/dL
Ketones, ur: NEGATIVE mg/dL
Nitrite: NEGATIVE
Protein, ur: NEGATIVE mg/dL
Specific Gravity, Urine: <1.005 — ABNORMAL LOW (ref 1.005–1.030)
pH: 5 (ref 5.0–8.0)

## 2020-04-02 MED ORDER — CIPROFLOXACIN HCL 500 MG PO TABS: 500.0000 mg | ORAL_TABLET | Freq: Every day | ORAL | 0 refills | Status: AC

## 2020-04-02 NOTE — ED Provider Notes (Signed)
[facility]    CSN: [id number] Arrival date & time: 04/02/20  1459      History   Chief Complaint Chief Complaint  Patient presents with  . Dysuria  . Back Pain    HPI [name] is a 74 y.o. female.   Patient is a 74 year old female who presents with chief complaint of possible UTI after several days of symptoms.  Patient reports pain during urination but reports more pain noted at the end of urination.  She reports she has frequent UTIs but states this 1 seems more irritating.  Patient states she has not take any medicines or has not increased her fluid intake.   Patient also reports recently being started on doxepin by her psychiatrist for hallucinations.  Patient also reports a history of psoriatic arthritis and is on Cosentyx twice monthly by injection at home.  She reports her next injection is due this weekend.      Past Medical History:  Diagnosis Date  . Candida esophagitis ([place])   . Carpal tunnel syndrome   . Chronic kidney disease (CKD), stage III (moderate)   . Diabetes mellitus    type II  . Dysplasia of cervix, low grade (CIN 1)   . Dyspnea   . Esophageal yeast infection ([place]) 2011  . Family history of adverse reaction to anesthesia    Sister - PONV  . Family history of malignant neoplasm of gastrointestinal tract   . Fatty liver   . Fibromyalgia   . Fundic gland polyps of stomach, benign   . Gastritis   . GERD (gastroesophageal reflux disease)   . Hemochromatosis   . Herpes   . Hypertension   . Hypothyroidism   . IBS (irritable bowel syndrome)   . Neuropathy   . Pelvic adhesions   . Peripheral neuropathy    lower legs  . Psoriatic arthritis ([place])   . Sleep apnea    no CPAP used  . Stroke [facility])    found through MRI.  affects tongue movement.  Marland Kitchen Unspecified gastritis and gastroduodenitis without mention of hemorrhage   . Wears hearing aid in both ears     Patient Active Problem List   Diagnosis Date Noted  . Acute  bilateral thoracic back pain 06/25/2017  . Closed fracture of right scapula with routine healing 06/25/2017  . Hx of essential hypertension 06/25/2017  . Elevated blood pressure reading 06/25/2017  . Vaginal atrophy 05/14/2013  . Carpal tunnel syndrome   . Drug-induced constipation 06/15/2011  . GERD (gastroesophageal reflux disease) 06/15/2011  . Dysplasia of cervix, low grade (CIN 1)   . Pelvic adhesions   . Herpes   . Peripheral neuropathy   . Diabetes mellitus   . Hemochromatosis   . CANDIDIASIS, ESOPHAGEAL 12/11/2010  . OBSTRUCTIVE SLEEP APNEA 12/07/2010  . HYPERGLYCEMIA 09/25/2010  . NAUSEA 09/08/2010  . ABDOMINAL PAIN, EPIGASTRIC 09/08/2010  . DIARRHEA 06/23/2010  . HEPATOMEGALY 06/23/2010  . 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 PERIPHERAL NEUROPATHY 02/22/2008  . HYPERTENSION 02/22/2008  . GERD 02/22/2008  . FATTY LIVER DISEASE 02/22/2008  . RENAL CYST, LEFT 02/22/2008  . INTERSTITIAL CYSTITIS 02/22/2008  . UTI'S, RECURRENT 02/22/2008  . DEGENERATIVE JOINT DISEASE 02/22/2008  . HERNIATED LUMBOSACRAL DISC 02/22/2008  . [place] DISEASE, CERVICAL 02/22/2008  . FIBROMYALGIA 02/22/2008  . SLEEP APNEA 02/22/2008  . EARLY SATIETY 02/22/2008  . 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 GASTRITIS 01/24/2008  . IRRITABLE BOWEL SYNDROME 01/24/2008  . DEPRESSION 01/19/2008  . GASTROPARESIS 01/19/2008  . MENINGIOMA  05/22/1996    Past Surgical History:  Procedure Laterality Date  . APPENDECTOMY     dx lap   . BACK SURGERY     L1-L5  . CATARACT EXTRACTION W/PHACO Right 08/07/2018   Procedure: CATARACT EXTRACTION PHACO AND INTRAOCULAR LENS PLACEMENT ([place])  RIGHT DIABETIC;  Surgeon: Eulogio Bear, MD;  Location: [place];  Service: Ophthalmology;  Laterality: Right;  diabetic - oral meds  . CATARACT EXTRACTION W/PHACO Left 10/09/2018   Procedure: CATARACT EXTRACTION PHACO AND INTRAOCULAR LENS PLACEMENT ([place]) LEFT IVA TOPICAL DIABETES;  Surgeon: Eulogio Bear, MD;  Location:  [place];  Service: Ophthalmology;  Laterality: Left;  diabetic - oral meds sleep apnea  . CERVICAL FUSION     c5-c7  . DL LEFT SALPINGECTOMY LYSIS OF ADHESIONS, RIGHT BTL    . FOOT SURGERY    . lap chloecystectomy    . PELVIC LAPAROSCOPY    . TUBAL LIGATION     RIGHT    OB History    Gravida  0   Para      Term      Preterm      AB      Living        SAB      TAB      Ectopic      Multiple      Live Births               Home Medications    Prior to Admission medications   Medication Sig Start Date End Date Taking? Authorizing Provider  amLODipine (NORVASC) 5 MG tablet Take 5 mg by mouth daily.   Yes [provider]  aspirin 81 MG tablet Take 81 mg by mouth daily.   Yes [provider]  atorvastatin (LIPITOR) 10 MG tablet Take 10 mg by mouth daily.   Yes [provider]  DULoxetine (CYMBALTA) 30 MG capsule Take 30 mg by mouth 2 (two) times daily.   Yes [provider]  furosemide (LASIX) 40 MG tablet Take 40 mg by mouth 2 (two) times daily.     Yes [provider]  glimepiride (AMARYL) 4 MG tablet Take 4 mg by mouth daily before breakfast.     Yes [provider]  ipratropium (ATROVENT) 0.06 % nasal spray Place 2 sprays into both nostrils 4 (four) times daily as needed for rhinitis.   Yes [provider]  lamoTRIgine (LAMICTAL) 200 MG tablet Take 200 mg by mouth daily.     Yes [provider]  levothyroxine (SYNTHROID, LEVOTHROID) 25 MCG tablet Take 25 mcg by mouth daily.     Yes [provider]  lisinopril (PRINIVIL,ZESTRIL) 20 MG tablet Take 40 mg by mouth daily.    Yes [provider]  Melatonin 1 MG TABS Take by mouth.   Yes [provider]  metFORMIN (GLUCOPHAGE) 500 MG tablet Take by mouth daily with breakfast.    Yes [provider]  metoprolol tartrate (LOPRESSOR) 50 MG tablet Take 50 mg by mouth 2 (two) times daily.   Yes [provider]  NONFORMULARY OR COMPOUNDED ITEM Estradiol 0.02 % 56ml prefilled applicator Sig: apply twice a week 06/03/15  Yes Terrance Mass, MD  omeprazole (PRILOSEC) 40 MG capsule Take 1 capsule (40 mg total) by mouth daily. 07/14/12  Yes Sable Feil, MD  ondansetron (ZOFRAN) 8 MG tablet Take by mouth every 8 (eight) hours as needed for nausea or vomiting.  Yes [provider]  pregabalin (LYRICA) 150 MG capsule Take 150 mg by mouth 2 (two) times daily.    Yes [provider]  Secukinumab (COSENTYX) 150 MG/ML SOSY Inject into the skin.   Yes [provider]  traMADol (ULTRAM) 50 MG tablet Take 1 tablet (50 mg total) by mouth every 6 (six) hours as needed. 06/23/17  Yes Luvenia Redden, PA-C  valACYclovir (VALTREX) 500 MG tablet TAKE 1 TABLET DAILY 08/25/15  Yes Terrance Mass, MD  vitamin B-12 (CYANOCOBALAMIN) 1000 MCG tablet Take 1,000 mcg by mouth daily.   Yes [provider]  ciprofloxacin (CIPRO) 500 MG tablet Take 1 tablet (500 mg total) by mouth daily for 7 days. 04/02/20 04/09/20  Luvenia Redden, PA-C    Family History Family History  Problem Relation Age of Onset  . Colon cancer Mother        71's dx  . Hypertension Mother   . Heart disease Mother   . Cancer Mother        ORAL  . Lung cancer Father   . Hypertension Sister   . Heart disease Sister   . Diabetes Sister   . Hypertension Maternal Grandmother   . Heart disease Maternal Grandmother   . Colon cancer Paternal Grandfather   . Stroke Brother   . Esophageal cancer Neg Hx   . Rectal cancer Neg Hx   . Stomach cancer Neg Hx     Social History Social History   Tobacco Use  . Smoking status: Never Smoker  . Smokeless tobacco: Never Used  Vaping Use  . Vaping Use: Never used  Substance Use Topics  . Alcohol use: No  . Drug use: No     Allergies   Patient has no known allergies.   Review of Systems Review of Systems as noted above in HPI.  Other systems  reviewed and found to be negative.   Physical Exam Triage Vital Signs ED Triage Vitals  Enc Vitals Group     BP 04/02/20 1522 (!) 137/59     Pulse Rate 04/02/20 1522 60     Resp 04/02/20 1522 18     Temp 04/02/20 1522 98.7 F (37.1 C)     Temp Source 04/02/20 1522 Oral     SpO2 04/02/20 1522 98 %     Weight 04/02/20 1520 195 lb 5.2 oz (88.6 kg)     Height 04/02/20 1520 5\' 8"  (1.727 m)     Head Circumference --      Peak Flow --      Pain Score 04/02/20 1519 0     Pain Loc --      Pain Edu? --      Excl. in [place]? --    No data found.  Updated Vital Signs BP (!) 137/59 (BP Location: Right Arm)   Pulse 60   Temp 98.7 F (37.1 C) (Oral)   Resp 18   Ht 5\' 8"  (1.727 m)   Wt 195 lb 5.2 oz (88.6 kg)   LMP 10/04/2001   SpO2 98%   BMI 29.70 kg/m    Physical Exam Constitutional:      Comments: Walks with a walker  Cardiovascular:     Rate and Rhythm: Normal rate and regular rhythm.  Pulmonary:     Effort: Pulmonary effort is normal. No respiratory distress.     Breath sounds: Normal breath sounds.  Abdominal:     Palpations: Abdomen is soft.  Tenderness: There is left CVA tenderness. There is no right CVA tenderness or guarding.  Musculoskeletal:     Comments: Arthritic changes to her hand  Skin:    General: Skin is warm and dry.     Capillary Refill: Capillary refill takes 2 to 3 seconds.  Neurological:     General: No focal deficit present.     Mental Status: She is alert and oriented to person, place, and time.  Psychiatric:        Mood and Affect: Mood normal.        Behavior: Behavior normal.      UC Treatments / Results  Labs (all labs ordered are listed, but only abnormal results are displayed) Labs Reviewed  URINALYSIS, COMPLETE (UACMP) WITH MICROSCOPIC - Abnormal; Notable for the following components:      Result Value   Specific Gravity, Urine <1.005 (*)    Hgb urine dipstick TRACE (*)    Leukocytes,Ua SMALL (*)    Non Squamous Epithelial  PRESENT (*)    Bacteria, UA MANY (*)    All other components within normal limits  URINE CULTURE    EKG   Radiology No results found.  Procedures Procedures (including critical care time)  Medications Ordered in UC Medications - No data to display  Initial Impression / Assessment and Plan / UC Course  I have reviewed the triage vital signs and the nursing notes.  Pertinent labs & imaging results that were available during my care of the patient were reviewed by me and considered in my medical decision making (see chart for details).     Patient with urinary tract symptoms on interview.  Patient with some left-sided CVA tenderness to palpation.  Urinalysis positive.  Based on possible pyelonephritis will give her prescription for Cipro.  Doses of medication is adjusted renally for her history of chronic kidney disease with estimated GFR of 29 from her labs on 03/23/20.  Will have patient push fluids.  Ibuprofen or Tylenol for pain.  Left her hold her next dose of Cosentyx and contact her rheumatologist to inquire about how long to hold that medication while she is on her antibiotic. Final Clinical Impressions(s) / UC Diagnoses   Final diagnoses:  Acute pyelonephritis     Discharge Instructions     -Cipro: 1 tablet daily for 7 days -Push fluids -Hold off on taking your next Cosentyx injection.  Would call or send a my chart message to your rheumatologist asking about how long to hold your medication. -Ibuprofen or Tylenol as needed for pain and fever -Follow with primary care provider as needed.    ED Prescriptions    Medication Sig Dispense Auth. Provider   ciprofloxacin (CIPRO) 500 MG tablet Take 1 tablet (500 mg total) by mouth daily for 7 days. 7 tablet Luvenia Redden, PA-C     PDMP not reviewed this encounter.   Luvenia Redden, PA-C 04/02/20 1554

## 2020-04-02 NOTE — Discharge Instructions (Signed)
-  Cipro: 1 tablet daily for 7 days -Push fluids -Hold off on taking your next Cosentyx injection.  Would call or send a my chart message to your rheumatologist asking about how long to hold your medication. -Ibuprofen or Tylenol as needed for pain and fever -Follow with primary care provider as needed.

## 2020-04-02 NOTE — ED Triage Notes (Signed)
Patient c/o dysuria and back pain x 3-4 days.

## 2020-04-06 LAB — URINE CULTURE: Culture: >=100000 — AB

## 2022-02-08 ENCOUNTER — Ambulatory Visit: Payer: Medicare Other | Attending: Neurology

## 2022-02-08 DIAGNOSIS — G4733 Obstructive sleep apnea (adult) (pediatric): Secondary | ICD-10-CM | POA: Insufficient documentation

## 2022-04-27 ENCOUNTER — Ambulatory Visit: Payer: Medicare Other | Attending: Neurology

## 2022-04-27 DIAGNOSIS — G4733 Obstructive sleep apnea (adult) (pediatric): Secondary | ICD-10-CM | POA: Diagnosis present

## 2022-12-16 ENCOUNTER — Other Ambulatory Visit: Payer: Self-pay | Admitting: Neurological Surgery

## 2022-12-16 DIAGNOSIS — M5416 Radiculopathy, lumbar region: Secondary | ICD-10-CM

## 2022-12-28 ENCOUNTER — Ambulatory Visit
Admission: RE | Admit: 2022-12-28 | Discharge: 2022-12-28 | Disposition: A | Discharge status: Home / Self Care | Payer: Medicare Other | Source: Ambulatory Visit | Attending: Neurological Surgery | Admitting: Neurological Surgery

## 2022-12-28 DIAGNOSIS — M5416 Radiculopathy, lumbar region: Secondary | ICD-10-CM | POA: Diagnosis present

## 2023-02-01 ENCOUNTER — Other Ambulatory Visit: Payer: Self-pay | Admitting: Internal Medicine

## 2023-02-01 DIAGNOSIS — Z1231 Encounter for screening mammogram for malignant neoplasm of breast: Secondary | ICD-10-CM

## 2023-03-11 ENCOUNTER — Inpatient Hospital Stay
Admission: RE | Admit: 2023-03-11 | Discharge: 2023-03-11 | Disposition: A | Discharge status: Home / Self Care | Payer: Self-pay | Source: Ambulatory Visit | Attending: Internal Medicine | Admitting: Internal Medicine

## 2023-03-11 ENCOUNTER — Other Ambulatory Visit: Payer: Self-pay | Admitting: *Deleted

## 2023-03-11 DIAGNOSIS — Z1231 Encounter for screening mammogram for malignant neoplasm of breast: Secondary | ICD-10-CM

## 2023-03-30 ENCOUNTER — Ambulatory Visit
Admission: RE | Admit: 2023-03-30 | Discharge: 2023-03-30 | Disposition: A | Discharge status: Home / Self Care | Payer: Medicare Other | Source: Ambulatory Visit | Attending: Internal Medicine | Admitting: Internal Medicine

## 2023-03-30 DIAGNOSIS — Z1231 Encounter for screening mammogram for malignant neoplasm of breast: Secondary | ICD-10-CM | POA: Diagnosis present

## 2023-05-26 ENCOUNTER — Encounter: Payer: Self-pay | Admitting: Gastroenterology

## 2023-05-26 ENCOUNTER — Encounter
Admission: RE | Disposition: A | Discharge status: Home / Self Care | Payer: Self-pay | Source: Home / Self Care | Attending: Gastroenterology

## 2023-05-26 ENCOUNTER — Ambulatory Visit: Payer: Medicare Other | Admitting: Registered Nurse

## 2023-05-26 ENCOUNTER — Ambulatory Visit
Admission: RE | Admit: 2023-05-26 | Discharge: 2023-05-26 | Disposition: A | Discharge status: Home / Self Care | Payer: Medicare Other | Attending: Gastroenterology | Admitting: Gastroenterology

## 2023-05-26 DIAGNOSIS — Z9049 Acquired absence of other specified parts of digestive tract: Secondary | ICD-10-CM | POA: Diagnosis not present

## 2023-05-26 DIAGNOSIS — M797 Fibromyalgia: Secondary | ICD-10-CM | POA: Diagnosis not present

## 2023-05-26 DIAGNOSIS — N183 Chronic kidney disease, stage 3 unspecified: Secondary | ICD-10-CM | POA: Diagnosis not present

## 2023-05-26 DIAGNOSIS — E1122 Type 2 diabetes mellitus with diabetic chronic kidney disease: Secondary | ICD-10-CM | POA: Insufficient documentation

## 2023-05-26 DIAGNOSIS — E039 Hypothyroidism, unspecified: Secondary | ICD-10-CM | POA: Diagnosis not present

## 2023-05-26 DIAGNOSIS — G4730 Sleep apnea, unspecified: Secondary | ICD-10-CM | POA: Insufficient documentation

## 2023-05-26 DIAGNOSIS — K640 First degree hemorrhoids: Secondary | ICD-10-CM | POA: Diagnosis not present

## 2023-05-26 DIAGNOSIS — Z7984 Long term (current) use of oral hypoglycemic drugs: Secondary | ICD-10-CM | POA: Diagnosis not present

## 2023-05-26 DIAGNOSIS — Z800 Family history of malignant neoplasm of digestive organs: Secondary | ICD-10-CM | POA: Diagnosis not present

## 2023-05-26 DIAGNOSIS — K529 Noninfective gastroenteritis and colitis, unspecified: Secondary | ICD-10-CM | POA: Diagnosis present

## 2023-05-26 DIAGNOSIS — K760 Fatty (change of) liver, not elsewhere classified: Secondary | ICD-10-CM | POA: Insufficient documentation

## 2023-05-26 DIAGNOSIS — Z8673 Personal history of transient ischemic attack (TIA), and cerebral infarction without residual deficits: Secondary | ICD-10-CM | POA: Diagnosis not present

## 2023-05-26 DIAGNOSIS — K219 Gastro-esophageal reflux disease without esophagitis: Secondary | ICD-10-CM | POA: Diagnosis not present

## 2023-05-26 DIAGNOSIS — K5730 Diverticulosis of large intestine without perforation or abscess without bleeding: Secondary | ICD-10-CM | POA: Insufficient documentation

## 2023-05-26 DIAGNOSIS — Z7985 Long-term (current) use of injectable non-insulin antidiabetic drugs: Secondary | ICD-10-CM | POA: Insufficient documentation

## 2023-05-26 DIAGNOSIS — I129 Hypertensive chronic kidney disease with stage 1 through stage 4 chronic kidney disease, or unspecified chronic kidney disease: Secondary | ICD-10-CM | POA: Insufficient documentation

## 2023-05-26 DIAGNOSIS — Z79899 Other long term (current) drug therapy: Secondary | ICD-10-CM | POA: Insufficient documentation

## 2023-05-26 DIAGNOSIS — D124 Benign neoplasm of descending colon: Secondary | ICD-10-CM | POA: Insufficient documentation

## 2023-05-26 DIAGNOSIS — L4050 Arthropathic psoriasis, unspecified: Secondary | ICD-10-CM | POA: Diagnosis not present

## 2023-05-26 HISTORY — PX: COLONOSCOPY WITH PROPOFOL: SHX5780

## 2023-05-26 HISTORY — PX: BIOPSY: SHX5522

## 2023-05-26 SURGERY — COLONOSCOPY WITH PROPOFOL
Anesthesia: General

## 2023-05-26 MED ORDER — LIDOCAINE HCL (CARDIAC) PF 100 MG/5ML IV SOSY
PREFILLED_SYRINGE | INTRAVENOUS | Status: DC | PRN
  Administered 2023-05-26: 40 mg via INTRAVENOUS

## 2023-05-26 MED ORDER — PROPOFOL 10 MG/ML IV BOLUS
INTRAVENOUS | Status: DC | PRN
  Administered 2023-05-26: 50 mg via INTRAVENOUS

## 2023-05-26 MED ORDER — SODIUM CHLORIDE 0.9 % IV SOLN
INTRAVENOUS | Status: DC
  Administered 2023-05-26: 1000 mL via INTRAVENOUS

## 2023-05-26 MED ORDER — PROPOFOL 500 MG/50ML IV EMUL
INTRAVENOUS | Status: DC | PRN
  Administered 2023-05-26: 100 ug/kg/min via INTRAVENOUS

## 2023-05-26 NOTE — Transfer of Care (Signed)
Immediate Anesthesia Transfer of Care Note  Patient: [name]  Procedure(s) Performed: COLONOSCOPY WITH PROPOFOL BIOPSY  Patient Location: PACU  Anesthesia Type:General  Level of Consciousness: drowsy and patient cooperative  Airway & Oxygen Therapy: Patient Spontanous Breathing  Post-op Assessment: Report given to RN and Post -op Vital signs reviewed and stable  Post vital signs: stable  Last Vitals:  Vitals Value Taken Time  BP 142/62 05/26/23 1316  Temp 35.8 C 05/26/23 1316  Pulse 70 05/26/23 1316  Resp 15 05/26/23 1316  SpO2 99 % 05/26/23 1316  Vitals shown include unfiled device data.  Last Pain:  Vitals:   05/26/23 1316  TempSrc: Temporal  PainSc: 0-No pain         Complications: No notable events documented.

## 2023-05-26 NOTE — Anesthesia Preprocedure Evaluation (Signed)
Anesthesia Evaluation  Patient identified by MRN, date of birth, ID band Patient awake    Reviewed: Allergy & Precautions, H&P , NPO status , Patient's Chart, lab work & pertinent test results, reviewed documented beta blocker date and time   History of Anesthesia Complications Negative for: history of anesthetic complications  Airway Mallampati: II  TM Distance: >3 FB Neck ROM: full    Dental  (+) Dental Advidsory Given, Caps, Teeth Intact   Pulmonary neg shortness of breath, sleep apnea and Continuous Positive Airway Pressure Ventilation , neg COPD, neg recent URI   Pulmonary exam normal breath sounds clear to auscultation       Cardiovascular Exercise Tolerance: Good hypertension, On Medications (-) angina (-) Past MI and (-) Cardiac Stents Normal cardiovascular exam(-) dysrhythmias + Valvular Problems/Murmurs  Rhythm:regular Rate:Normal     Neuro/Psych neg Seizures PSYCHIATRIC DISORDERS  Depression     Neuromuscular disease    GI/Hepatic Neg liver ROS,GERD  ,,  Endo/Other  diabetes, Well Controlled, Type 2Hypothyroidism    Renal/GU CRFRenal disease  negative genitourinary   Musculoskeletal   Abdominal   Peds  Hematology negative hematology ROS (+)   Anesthesia Other Findings Past Medical History: No date: Candida esophagitis (HCC) No date: Carpal tunnel syndrome No date: Chronic kidney disease (CKD), stage III (moderate) (HCC) No date: Diabetes mellitus     Comment:  type II No date: Dysplasia of cervix, low grade (CIN 1) No date: Dyspnea 2011: Esophageal yeast infection (HCC) No date: Family history of adverse reaction to anesthesia     Comment:  Sister - PONV No date: Family history of malignant neoplasm of gastrointestinal  tract No date: Fatty liver No date: Fibromyalgia No date: Fundic gland polyps of stomach, benign No date: Gastritis No date: GERD (gastroesophageal reflux disease) No date:  Hemochromatosis No date: Herpes No date: Hypertension No date: Hypothyroidism No date: IBS (irritable bowel syndrome) No date: Neuropathy No date: Pelvic adhesions No date: Peripheral neuropathy     Comment:  lower legs No date: Psoriatic arthritis (HCC) No date: Sleep apnea     Comment:  no CPAP used No date: Stroke [facility])     Comment:  found through MRI.  affects tongue movement. No date: Unspecified gastritis and gastroduodenitis without mention  of hemorrhage No date: Wears hearing aid in both ears   Reproductive/Obstetrics negative OB ROS                             Anesthesia Physical Anesthesia Plan  ASA: 3  Anesthesia Plan: General   Post-op Pain Management:    Induction: Intravenous  PONV Risk Score and Plan: 3 and Propofol infusion and TIVA  Airway Management Planned: Natural Airway and Nasal Cannula  Additional Equipment:   Intra-op Plan:   Post-operative Plan:   Informed Consent: I have reviewed the patients History and Physical, chart, labs and discussed the procedure including the risks, benefits and alternatives for the proposed anesthesia with the patient or authorized representative who has indicated his/her understanding and acceptance.     Dental Advisory Given  Plan Discussed with: Anesthesiologist, CRNA and Surgeon  Anesthesia Plan Comments:        Anesthesia Quick Evaluation

## 2023-05-26 NOTE — Interval H&P Note (Signed)
History and Physical Interval Note: Preprocedure H&P from 05/26/23  was reviewed and there was no interval change after seeing and examining the patient.  Written consent was obtained from the patient after discussion of risks, benefits, and alternatives. Patient has consented to proceed with Colonoscopy with possible intervention   05/26/2023 12:35 PM  [name]  has presented today for surgery, with the diagnosis of V16.0 (ICD-9-CM) - Z80.0 (ICD-10-CM) - Family history of colon cancer.  The various methods of treatment have been discussed with the patient and family. After consideration of risks, benefits and other options for treatment, the patient has consented to  Procedure(s): COLONOSCOPY WITH PROPOFOL (N/A) as a surgical intervention.  The patient's history has been reviewed, patient examined, no change in status, stable for surgery.  I have reviewed the patient's chart and labs.  Questions were answered to the patient's satisfaction.     Jaynie Collins

## 2023-05-26 NOTE — Op Note (Addendum)
[facility] Gastroenterology Patient Name: [name] Procedure Date: 05/26/2023 11:55 AM MRN: [id number] Account #: [id number] Date of Birth: Jul 26, 1946 Admit Type: Outpatient Age: 77 Room: [facility] ENDO ROOM 1 Gender: Female Note Status: Supervisor Override Instrument Name: Prentice Docker [id number] Procedure:             Colonoscopy Indications:           Family history of colon cancer Providers:             Jaynie Collins DO, DO Referring MD:          Enid Baas, MD (Referring MD) Medicines:             Monitored Anesthesia Care Complications:         No immediate complications. Estimated blood loss:                         Minimal. Procedure:             Pre-Anesthesia Assessment:                        - Prior to the procedure, a History and Physical was                         performed, and patient medications and allergies were                         reviewed. The patient is competent. The risks and                         benefits of the procedure and the sedation options and                         risks were discussed with the patient. All questions                         were answered and informed consent was obtained.                         Patient identification and proposed procedure were                         verified by the physician, the nurse, the anesthetist                         and the technician in the endoscopy suite. Mental                         Status Examination: alert and oriented. Airway                         Examination: normal oropharyngeal airway and neck                         mobility. Respiratory Examination: clear to                         auscultation. CV Examination: RRR, no murmurs, no S3  or S4. Prophylactic Antibiotics: The patient does not                         require prophylactic antibiotics. Prior                         Anticoagulants: The patient has taken no anticoagulant                          or antiplatelet agents. ASA Grade Assessment: III - A                         patient with severe systemic disease. After reviewing                         the risks and benefits, the patient was deemed in                         satisfactory condition to undergo the procedure. The                         anesthesia plan was to use monitored anesthesia care                         (MAC). Immediately prior to administration of                         medications, the patient was re-assessed for adequacy                         to receive sedatives. The heart rate, respiratory                         rate, oxygen saturations, blood pressure, adequacy of                         pulmonary ventilation, and response to care were                         monitored throughout the procedure. The physical                         status of the patient was re-assessed after the                         procedure.                        After obtaining informed consent, the colonoscope was                         passed under direct vision. Throughout the procedure,                         the patient's blood pressure, pulse, and oxygen                         saturations were monitored continuously. The  Colonoscope was introduced through the anus and                         advanced to the the terminal ileum, with                         identification of the appendiceal orifice and IC                         valve. The colonoscopy was performed without                         difficulty. The patient tolerated the procedure well.                         The quality of the bowel preparation was evaluated                         using the BBPS [facility] Bowel Preparation Scale) with                         scores of: Right Colon = 2 (minor amount of residual                         staining, small fragments of stool and/or opaque                          liquid, but mucosa seen well), Transverse Colon = 2                         (minor amount of residual staining, small fragments of                         stool and/or opaque liquid, but mucosa seen well) and                         Left Colon = 2 (minor amount of residual staining,                         small fragments of stool and/or opaque liquid, but                         mucosa seen well). The total BBPS score equals 6. The                         quality of the bowel preparation was good. Prep                         improved from fair to good with lavage The terminal                         ileum, ileocecal valve, appendiceal orifice, and                         rectum were photographed. Findings:      The perianal and digital rectal examinations were normal. Pertinent  negatives include normal sphincter tone.      The terminal ileum appeared normal. Estimated blood loss: none.      Retroflexion in the right colon was performed.      A moderate amount of stool was found in the entire colon, interfering       with visualization. Lavage of the area was performed using a large       amount, resulting in clearance with good visualization. Estimated blood       loss: none.      A 1 to 2 mm polyp was found in the descending colon. The polyp was       sessile. The polyp was removed with a jumbo cold forceps. Resection and       retrieval were complete. Estimated blood loss was minimal.      Normal mucosa was found in the entire colon. Biopsies for histology were       taken with a cold forceps from the right colon and left colon for       evaluation of microscopic colitis. Estimated blood loss was minimal.      Scattered small-mouthed diverticula were found in the left colon.       Estimated blood loss: none.      Non-bleeding internal hemorrhoids were found during retroflexion. The       hemorrhoids were Grade I (internal hemorrhoids that do not prolapse).       Estimated blood  loss: none.      The exam was otherwise without abnormality on direct and retroflexion       views. Impression:            - The examined portion of the ileum was normal.                        - Stool in the entire examined colon.                        - One 1 to 2 mm polyp in the descending colon, removed                         with a jumbo cold forceps. Resected and retrieved.                        - Normal mucosa in the entire examined colon. Biopsied.                        - Diverticulosis in the left colon.                        - Non-bleeding internal hemorrhoids.                        - The examination was otherwise normal on direct and                         retroflexion views. Recommendation:        - Patient has a contact number available for                         emergencies. The signs and symptoms of potential  delayed complications were discussed with the patient.                         Return to normal activities tomorrow. Written                         discharge instructions were provided to the patient.                        - Discharge patient to home.                        - Resume previous diet.                        - Continue present medications.                        - Await pathology results.                        - Repeat colonoscopy for surveillance based on                         pathology results.                        - Return to GI office as previously scheduled.                        - The findings and recommendations were discussed with                         the patient. Procedure Code(s):     --- Professional ---                        [phone], Colonoscopy, flexible; with biopsy, single or                         multiple Diagnosis Code(s):     --- Professional ---                        K64.0, First degree hemorrhoids                        D12.4, Benign neoplasm of descending colon                         K52.9, Noninfective gastroenteritis and colitis,                         unspecified                        K57.30, Diverticulosis of large intestine without                         perforation or abscess without bleeding CPT copyright 2022 American [employer] Association. All rights reserved. The codes documented in this report are preliminary and upon coder review may  be revised to meet current compliance requirements. Attending Participation:      I  personally performed the entire procedure. Elfredia Nevins, DO Jaynie Collins DO, DO 05/26/2023 1:17:03 PM This report has been signed electronically. Number of Addenda: 0 Note Initiated On: 05/26/2023 11:55 AM Scope Withdrawal Time: 0 hours 17 minutes 37 seconds  Total Procedure Duration: 0 hours 25 minutes 27 seconds  Estimated Blood Loss:  Estimated blood loss was minimal.      [facility]

## 2023-05-26 NOTE — H&P (Signed)
Pre-Procedure H&P   Patient ID: [name] is a 77 y.o. female.  Gastroenterology Provider: Jaynie Collins, DO  Referring Provider: Tawni Pummel, PA PCP: Enid Baas, MD  Date: 05/26/2023  HPI [name] is a 77 y.o. female who presents today for Colonoscopy for Change in bowel habits; family history of colon cancer .  Patient's mother had colon cancer in her 8s.  The patient has had alternating diarrhea and constipation without melena or hematochezia. [name] is status post cholecystectomy and appendectomy.  [name] underwent colonoscopy in 2016 which was normal  On Ozempic which has been held for the procedure- last dose 05/14/23 Creatinine 1.3 hemoglobin 14.2 MCV 104 platelets 234,000   Past Medical History:  Diagnosis Date   Candida esophagitis (HCC)    Carpal tunnel syndrome    Chronic kidney disease (CKD), stage III (moderate) (HCC)    Diabetes mellitus    type II   Dysplasia of cervix, low grade (CIN 1)    Dyspnea    Esophageal yeast infection (HCC) 2011   Family history of adverse reaction to anesthesia    Sister - PONV   Family history of malignant neoplasm of gastrointestinal tract    Fatty liver    Fibromyalgia    Fundic gland polyps of stomach, benign    Gastritis    GERD (gastroesophageal reflux disease)    Hemochromatosis    Herpes    Hypertension    Hypothyroidism    IBS (irritable bowel syndrome)    Neuropathy    Pelvic adhesions    Peripheral neuropathy    lower legs   Psoriatic arthritis (HCC)    Sleep apnea    no CPAP used   Stroke [facility])    found through MRI.  affects tongue movement.   Unspecified gastritis and gastroduodenitis without mention of hemorrhage    Wears hearing aid in both ears     Past Surgical History:  Procedure Laterality Date   APPENDECTOMY     dx lap    BACK SURGERY     L1-L5   CATARACT EXTRACTION W/PHACO Right 08/07/2018   Procedure: CATARACT EXTRACTION PHACO AND INTRAOCULAR LENS PLACEMENT  (IOC)  RIGHT DIABETIC;  Surgeon: Nevada Crane, MD;  Location: [facility];  Service: Ophthalmology;  Laterality: Right;  diabetic - oral meds   CATARACT EXTRACTION W/PHACO Left 10/09/2018   Procedure: CATARACT EXTRACTION PHACO AND INTRAOCULAR LENS PLACEMENT (IOC) LEFT IVA TOPICAL DIABETES;  Surgeon: Nevada Crane, MD;  Location: [facility];  Service: Ophthalmology;  Laterality: Left;  diabetic - oral meds sleep apnea   CERVICAL FUSION     c5-c7   DL LEFT SALPINGECTOMY LYSIS OF ADHESIONS, RIGHT BTL     EYE SURGERY     FOOT SURGERY     lap chloecystectomy     PELVIC LAPAROSCOPY     TUBAL LIGATION     RIGHT    Family History Mother- crc- 53s No other h/o GI disease or malignancy  Review of Systems  Constitutional:  Negative for activity change, appetite change, chills, diaphoresis, fatigue, fever and unexpected weight change.  HENT:  Negative for trouble swallowing and voice change.   Respiratory:  Negative for shortness of breath and wheezing.   Cardiovascular:  Negative for chest pain, palpitations and leg swelling.  Gastrointestinal:  Positive for abdominal pain, constipation and diarrhea. Negative for abdominal distention, anal bleeding, blood in stool, nausea, rectal pain and vomiting.  Musculoskeletal:  Negative for arthralgias  and myalgias.  Skin:  Negative for color change and pallor.  Neurological:  Negative for dizziness, syncope and weakness.  Psychiatric/Behavioral:  Negative for confusion.   All other systems reviewed and are negative.    Medications No current facility-administered medications on file prior to encounter.   Current Outpatient Medications on File Prior to Encounter  Medication Sig Dispense Refill   aspirin 81 MG tablet Take 81 mg by mouth daily.     atorvastatin (LIPITOR) 10 MG tablet Take 10 mg by mouth daily.     DULoxetine (CYMBALTA) 30 MG capsule Take 30 mg by mouth 2 (two) times daily.     furosemide (LASIX) 40 MG  tablet Take 40 mg by mouth 2 (two) times daily.       ipratropium (ATROVENT) 0.06 % nasal spray Place 2 sprays into both nostrils 4 (four) times daily as needed for rhinitis.     lamoTRIgine (LAMICTAL) 200 MG tablet Take 200 mg by mouth daily.       levothyroxine (SYNTHROID, LEVOTHROID) 25 MCG tablet Take 25 mcg by mouth daily.       lisinopril (PRINIVIL,ZESTRIL) 20 MG tablet Take 40 mg by mouth daily.      Melatonin 1 MG TABS Take by mouth.     NONFORMULARY OR COMPOUNDED ITEM Estradiol 0.02 % 1ml prefilled applicator Sig: apply twice a week 24 each 4   omeprazole (PRILOSEC) 40 MG capsule Take 1 capsule (40 mg total) by mouth daily. 90 capsule 3   pregabalin (LYRICA) 150 MG capsule Take 150 mg by mouth 2 (two) times daily.      Semaglutide, 1 MG/DOSE, (OZEMPIC, 1 MG/DOSE,) 2 MG/1.5ML SOPN Inject 1 mg into the skin once a week.     valACYclovir (VALTREX) 500 MG tablet TAKE 1 TABLET DAILY 90 tablet 4   vitamin B-12 (CYANOCOBALAMIN) 1000 MCG tablet Take 1,000 mcg by mouth daily.     amLODipine (NORVASC) 5 MG tablet Take 5 mg by mouth daily. (Patient not taking: Reported on 05/26/2023)     glimepiride (AMARYL) 4 MG tablet Take 4 mg by mouth daily before breakfast.   (Patient not taking: Reported on 05/26/2023)     metFORMIN (GLUCOPHAGE) 500 MG tablet Take by mouth daily with breakfast.  (Patient not taking: Reported on 05/26/2023)     metoprolol tartrate (LOPRESSOR) 50 MG tablet Take 50 mg by mouth 2 (two) times daily. (Patient not taking: Reported on 05/26/2023)     ondansetron (ZOFRAN) 8 MG tablet Take by mouth every 8 (eight) hours as needed for nausea or vomiting.     Secukinumab (COSENTYX) 150 MG/ML SOSY Inject into the skin. (Patient not taking: Reported on 05/26/2023)     traMADol (ULTRAM) 50 MG tablet Take 1 tablet (50 mg total) by mouth every 6 (six) hours as needed. 15 tablet 0    Pertinent medications related to GI and procedure were reviewed by me with the patient prior to the  procedure   Current Facility-Administered Medications:    0.9 %  sodium chloride infusion, , Intravenous, Continuous, Jaynie Collins, DO, Last Rate: 20 mL/hr at 05/26/23 1223, Continued from Pre-op at 05/26/23 1223  sodium chloride 20 mL/hr at 05/26/23 1223       No Known Allergies Allergies were reviewed by me prior to the procedure  Objective   Body mass index is 29.4 kg/m. Vitals:   05/26/23 1208  BP: (!) 169/67  Pulse: 66  Resp: 18  Temp: (!) 96.8 F (36 C)  TempSrc: Temporal  SpO2: 100%  Weight: 87.7 kg  Height: 5\' 8"  (1.727 m)     Physical Exam Vitals and nursing note reviewed.  Constitutional:      General: [name] is not in acute distress.    Appearance: Normal appearance. [name] is not ill-appearing, toxic-appearing or diaphoretic.  HENT:     Head: Normocephalic and atraumatic.     Nose: Nose normal.     Mouth/Throat:     Mouth: Mucous membranes are moist.     Pharynx: Oropharynx is clear.  Eyes:     General: No scleral icterus.    Extraocular Movements: Extraocular movements intact.  Cardiovascular:     Rate and Rhythm: Normal rate and regular rhythm.     Heart sounds: Normal heart sounds. No murmur heard.    No friction rub. No gallop.  Pulmonary:     Effort: Pulmonary effort is normal. No respiratory distress.     Breath sounds: Normal breath sounds. No wheezing, rhonchi or rales.  Abdominal:     General: Bowel sounds are normal. There is no distension.     Palpations: Abdomen is soft.     Tenderness: There is no abdominal tenderness. There is no guarding or rebound.  Musculoskeletal:     Cervical back: Neck supple.     Right lower leg: No edema.     Left lower leg: No edema.  Skin:    General: Skin is warm and dry.     Coloration: Skin is not jaundiced or pale.  Neurological:     General: No focal deficit present.     Mental Status: [name] is alert and oriented to person, place, and time. Mental status is at baseline.  Psychiatric:         Mood and Affect: Mood normal.        Behavior: Behavior normal.        Thought Content: Thought content normal.        Judgment: Judgment normal.      Assessment:  [name] is a 77 y.o. female  who presents today for Colonoscopy for Change in bowel habits; family history of colon cancer .  Plan:  Colonoscopy with possible intervention today  Colonoscopy with possible biopsy, control of bleeding, polypectomy, and interventions as necessary has been discussed with the patient/patient representative. Informed consent was obtained from the patient/patient representative after explaining the indication, nature, and risks of the procedure including but not limited to death, bleeding, perforation, missed neoplasm/lesions, cardiorespiratory compromise, and reaction to medications. Opportunity for questions was given and appropriate answers were provided. Patient/patient representative has verbalized understanding is amenable to undergoing the procedure.   Jaynie Collins, DO  [facility] Gastroenterology  Portions of the record may have been created with voice recognition software. Occasional wrong-word or 'sound-a-like' substitutions may have occurred due to the inherent limitations of voice recognition software.  Read the chart carefully and recognize, using context, where substitutions may have occurred.

## 2023-05-27 ENCOUNTER — Encounter: Payer: Self-pay | Admitting: Gastroenterology

## 2023-06-01 NOTE — Addendum Note (Signed)
Addendum  created 06/01/23 1058 by Lenard Simmer, MD   Clinical Note Signed

## 2023-06-01 NOTE — Anesthesia Postprocedure Evaluation (Addendum)
Anesthesia Post Note  Patient: [name]  Procedure(s) Performed: COLONOSCOPY WITH PROPOFOL BIOPSY  Patient location during evaluation: Endoscopy Anesthesia Type: General Level of consciousness: awake and alert Pain management: pain level controlled Vital Signs Assessment: post-procedure vital signs reviewed and stable Respiratory status: spontaneous breathing, nonlabored ventilation, respiratory function stable and patient connected to nasal cannula oxygen Cardiovascular status: blood pressure returned to baseline and stable Postop Assessment: no apparent nausea or vomiting Anesthetic complications: no   No notable events documented.   Last Vitals:  Vitals:   05/26/23 1336 05/26/23 1346  BP: (!) 177/78 (!) 179/71  Pulse: 66   Resp:    Temp:    SpO2: 100% 100%    Last Pain:  Vitals:   05/27/23 0738  TempSrc:   PainSc: 0-No pain                 Lenard Simmer

## 2024-02-21 ENCOUNTER — Encounter (INDEPENDENT_AMBULATORY_CARE_PROVIDER_SITE_OTHER): Payer: Self-pay

## 2024-02-29 ENCOUNTER — Other Ambulatory Visit (INDEPENDENT_AMBULATORY_CARE_PROVIDER_SITE_OTHER): Payer: Self-pay | Admitting: Nurse Practitioner

## 2024-02-29 DIAGNOSIS — I739 Peripheral vascular disease, unspecified: Secondary | ICD-10-CM

## 2024-03-02 ENCOUNTER — Ambulatory Visit (INDEPENDENT_AMBULATORY_CARE_PROVIDER_SITE_OTHER)

## 2024-03-02 ENCOUNTER — Ambulatory Visit (INDEPENDENT_AMBULATORY_CARE_PROVIDER_SITE_OTHER): Payer: Self-pay | Admitting: Nurse Practitioner

## 2024-03-02 ENCOUNTER — Encounter (INDEPENDENT_AMBULATORY_CARE_PROVIDER_SITE_OTHER): Payer: Self-pay | Admitting: Nurse Practitioner

## 2024-03-02 VITALS — BP 144/74 | HR 70 | Resp 18 | Ht 68.0 in | Wt 194.6 lb

## 2024-03-02 DIAGNOSIS — I10 Essential (primary) hypertension: Secondary | ICD-10-CM

## 2024-03-02 DIAGNOSIS — G609 Hereditary and idiopathic neuropathy, unspecified: Secondary | ICD-10-CM | POA: Diagnosis not present

## 2024-03-02 DIAGNOSIS — I739 Peripheral vascular disease, unspecified: Secondary | ICD-10-CM

## 2024-03-03 NOTE — Progress Notes (Unsigned)
 Subjective:    Patient ID: [name], female    DOB: 10/09/1945, 78 y.o.   MRN: [id number] Chief Complaint  Patient presents with  . New Patient (Initial Visit)    np. ABI + consult. PAD. kalsetti.    HPI  Review of Systems     Objective:    Physical Exam  BP (!) 144/74   Pulse 70   Resp 18   Ht 5\' 8"  (1.727 m)   Wt 194 lb 9.6 oz (88.3 kg)   LMP 10/04/2001   BMI 29.59 kg/m   Past Medical History:  Diagnosis Date  . Candida esophagitis (HCC)   . Carpal tunnel syndrome   . Chronic kidney disease (CKD), stage III (moderate) (HCC)   . Diabetes mellitus    type II  . Dysplasia of cervix, low grade (CIN 1)   . Dyspnea   . Esophageal yeast infection (HCC) 2011  . Family history of adverse reaction to anesthesia    Sister - PONV  . Family history of malignant neoplasm of gastrointestinal tract   . Fatty liver   . Fibromyalgia   . Fundic gland polyps of stomach, benign   . Gastritis   . GERD (gastroesophageal reflux disease)   . Hemochromatosis   . Herpes   . Hypertension   . Hypothyroidism   . IBS (irritable bowel syndrome)   . Neuropathy   . Pelvic adhesions   . Peripheral neuropathy    lower legs  . Psoriatic arthritis (HCC)   . Sleep apnea    no CPAP used  . Stroke [facility])    found through MRI.  affects tongue movement.  Aaron Aas Unspecified gastritis and gastroduodenitis without mention of hemorrhage   . Wears hearing aid in both ears     Social History   Socioeconomic History  . Marital status: Widowed    Spouse name: Not on file  . Number of children: Not on file  . Years of education: Not on file  . Highest education level: Not on file  Occupational History  . Not on file  Tobacco Use  . Smoking status: Never  . Smokeless tobacco: Never  Vaping Use  . Vaping status: Never Used  Substance and Sexual Activity  . Alcohol use: No  . Drug use: No  . Sexual activity: Never    Birth control/protection: Surgical, Post-menopausal  Other Topics  Concern  . Not on file  Social History Narrative  . Not on file   Social Drivers of Health   Financial Resource Strain: Low Risk  (12/06/2023)   Received from [facility]   Overall Financial Resource Strain (CARDIA)   . Difficulty of Paying Living Expenses: Not hard at all  Food Insecurity: No Food Insecurity (12/06/2023)   Received from [facility]   Hunger Vital Sign   . Worried About Programme researcher, broadcasting/film/video in the Last Year: Never true   . Ran Out of Food in the Last Year: Never true  Transportation Needs: No Transportation Needs (12/06/2023)   Received from [facility]   [facility] - Transportation   . In the past 12 months, has lack of transportation kept you from medical appointments or from getting medications?: No   . Lack of Transportation (Non-Medical): No  Physical Activity: Not on file  Stress: Not on file  Social Connections: Not on file  Intimate Partner Violence: Not on file    Past Surgical History:  Procedure Laterality  Date  . APPENDECTOMY     dx lap   . BACK SURGERY     L1-L5  . BIOPSY  05/26/2023   Procedure: BIOPSY;  Surgeon: Quintin Buckle, DO;  Location: [facility] ENDOSCOPY;  Service: Gastroenterology;;  . CATARACT EXTRACTION W/PHACO Right 08/07/2018   Procedure: CATARACT EXTRACTION PHACO AND INTRAOCULAR LENS PLACEMENT (IOC)  RIGHT DIABETIC;  Surgeon: Rosa College, MD;  Location: [facility]GERY [facility]TR;  Service: Ophthalmology;  Laterality: Right;  diabetic - oral meds  . CATARACT EXTRACTION W/PHACO Left 10/09/2018   Procedure: CATARACT EXTRACTION PHACO AND INTRAOCULAR LENS PLACEMENT (IOC) LEFT IVA TOPICAL DIABETES;  Surgeon: Rosa College, MD;  Location: [facility];  Service: Ophthalmology;  Laterality: Left;  diabetic - oral meds sleep apnea  . CERVICAL FUSION     c5-c7  . COLONOSCOPY WITH PROPOFOL  N/A 05/26/2023   Procedure: COLONOSCOPY WITH PROPOFOL ;  Surgeon: Quintin Buckle, DO;   Location: [facility] ENDOSCOPY;  Service: Gastroenterology;  Laterality: N/A;  . DL LEFT SALPINGECTOMY LYSIS OF ADHESIONS, RIGHT BTL    . EYE SURGERY    . FOOT SURGERY    . lap chloecystectomy    . PELVIC LAPAROSCOPY    . TUBAL LIGATION     RIGHT    Family History  Problem Relation Age of Onset  . Colon cancer Mother        22's dx  . Hypertension Mother   . Heart disease Mother   . Cancer Mother        ORAL  . Lung cancer Father   . Hypertension Sister   . Heart disease Sister   . Diabetes Sister   . Hypertension Maternal Grandmother   . Heart disease Maternal Grandmother   . Colon cancer Paternal Grandfather   . Stroke Brother   . Esophageal cancer Neg Hx   . Rectal cancer Neg Hx   . Stomach cancer Neg Hx     No Known Allergies     Latest Ref Rng & Units 07/27/2018    3:32 PM 07/20/2011    4:17 AM 07/19/2011    4:11 PM  CBC  WBC 4.0 - 10.5 K/uL 5.7  9.2    Hemoglobin 12.0 - 15.0 g/dL 09.6  04.5  8.8   Hematocrit 36.0 - 46.0 % 37.1  33.6  26.0   Platelets 150 - 400 K/uL 360  202         CMP     Component Value Date/Time   NA 141 07/27/2018 1532   K 4.3 07/27/2018 1532   CL 106 07/27/2018 1532   CO2 25 07/27/2018 1532   GLUCOSE 216 (H) 07/27/2018 1532   BUN 23 07/27/2018 1532   CREATININE 1.24 (H) 07/27/2018 1532   CALCIUM 8.9 07/27/2018 1532   PROT 7.7 07/27/2018 1532   ALBUMIN 4.3 07/27/2018 1532   AST 26 07/27/2018 1532   ALT 18 07/27/2018 1532   ALKPHOS 117 07/27/2018 1532   BILITOT 1.0 07/27/2018 1532   GFRNONAA 43 (L) 07/27/2018 1532     No results found.     Assessment & Plan:   1. PAD (peripheral artery disease) (HCC) (Primary) ***  2. HYPERTENSION ***  3. PERIPHERAL NEUROPATHY ***   Current Outpatient Medications on File Prior to Visit  Medication Sig Dispense Refill  . acetaminophen  (TYLENOL ) 650 MG CR tablet     . aspirin 81 MG tablet Take 81 mg by mouth daily.    Aaron Aas atorvastatin (LIPITOR) 10 MG tablet Take  10 mg by mouth  daily.    . carvedilol (COREG) 25 MG tablet Take 25 mg by mouth.    . ferrous sulfate ER 142 (45 Fe) MG TBCR tablet     . furosemide (LASIX) 40 MG tablet Take 40 mg by mouth 2 (two) times daily.      Aaron Aas levothyroxine (SYNTHROID, LEVOTHROID) 25 MCG tablet Take 25 mcg by mouth daily.      Aaron Aas loperamide (IMODIUM A-D) 2 MG tablet Take 2 mg by mouth.    . loratadine (CLARITIN) 10 MG tablet Take 10 mg by mouth.    . methocarbamol (ROBAXIN) 500 MG tablet TAKE 1 TABLET (500 MG TOTAL) BY MOUTH TWICE A DAY    . omeprazole  (PRILOSEC) 40 MG capsule Take 1 capsule (40 mg total) by mouth daily. 90 capsule 3  . ondansetron  (ZOFRAN -ODT) 4 MG disintegrating tablet Take 4 mg by mouth every 6 (six) hours as needed.    . pregabalin (LYRICA) 150 MG capsule Take 150 mg by mouth 2 (two) times daily.     . Semaglutide, 1 MG/DOSE, (OZEMPIC, 1 MG/DOSE,) 2 MG/1.5ML SOPN Inject 1 mg into the skin once a week.    . traMADol  (ULTRAM ) 50 MG tablet Take 1 tablet (50 mg total) by mouth every 6 (six) hours as needed. 15 tablet 0  . valACYclovir  (VALTREX ) 500 MG tablet TAKE 1 TABLET DAILY 90 tablet 4  . vitamin B-12 (CYANOCOBALAMIN) 1000 MCG tablet Take 1,000 mcg by mouth daily.    Aaron Aas amLODipine (NORVASC) 5 MG tablet Take 5 mg by mouth daily. (Patient not taking: Reported on 05/26/2023)    . DULoxetine (CYMBALTA) 30 MG capsule Take 30 mg by mouth 2 (two) times daily.    Aaron Aas glimepiride (AMARYL) 4 MG tablet Take 4 mg by mouth daily before breakfast.   (Patient not taking: Reported on 05/26/2023)    . ipratropium (ATROVENT) 0.06 % nasal spray Place 2 sprays into both nostrils 4 (four) times daily as needed for rhinitis.    Aaron Aas lamoTRIgine (LAMICTAL) 200 MG tablet Take 200 mg by mouth daily.      Aaron Aas lisinopril (PRINIVIL,ZESTRIL) 20 MG tablet Take 40 mg by mouth daily.     . Melatonin 1 MG TABS Take by mouth.    . metFORMIN (GLUCOPHAGE) 500 MG tablet Take by mouth daily with breakfast.  (Patient not taking: Reported on 05/26/2023)    .  metoprolol tartrate (LOPRESSOR) 50 MG tablet Take 50 mg by mouth 2 (two) times daily. (Patient not taking: Reported on 05/26/2023)    . NONFORMULARY OR COMPOUNDED ITEM Estradiol  0.02 % 1ml prefilled applicator Sig: apply twice a week 24 each 4  . ondansetron  (ZOFRAN ) 8 MG tablet Take by mouth every 8 (eight) hours as needed for nausea or vomiting.    . Secukinumab (COSENTYX) 150 MG/ML SOSY Inject into the skin. (Patient not taking: Reported on 05/26/2023)     No current facility-administered medications on file prior to visit.    There are no Patient Instructions on file for this visit. No follow-ups on file.   Danne Vasek E Ellis Mehaffey, NP

## 2024-07-17 ENCOUNTER — Other Ambulatory Visit: Payer: Self-pay | Admitting: Medical Genetics

## 2024-07-19 ENCOUNTER — Ambulatory Visit: Admitting: Anesthesiology

## 2024-07-19 ENCOUNTER — Encounter: Admission: RE | Disposition: A | Payer: Self-pay | Source: Home / Self Care | Attending: Gastroenterology

## 2024-07-19 ENCOUNTER — Other Ambulatory Visit: Payer: Self-pay

## 2024-07-19 ENCOUNTER — Encounter: Payer: Self-pay | Admitting: Gastroenterology

## 2024-07-19 ENCOUNTER — Ambulatory Visit
Admission: RE | Admit: 2024-07-19 | Discharge: 2024-07-19 | Disposition: A | Discharge status: Home / Self Care | Attending: Gastroenterology | Admitting: Gastroenterology

## 2024-07-19 DIAGNOSIS — Z8673 Personal history of transient ischemic attack (TIA), and cerebral infarction without residual deficits: Secondary | ICD-10-CM | POA: Diagnosis not present

## 2024-07-19 DIAGNOSIS — Z7984 Long term (current) use of oral hypoglycemic drugs: Secondary | ICD-10-CM | POA: Insufficient documentation

## 2024-07-19 DIAGNOSIS — Z800 Family history of malignant neoplasm of digestive organs: Secondary | ICD-10-CM | POA: Diagnosis not present

## 2024-07-19 DIAGNOSIS — K640 First degree hemorrhoids: Secondary | ICD-10-CM | POA: Diagnosis not present

## 2024-07-19 DIAGNOSIS — Z1211 Encounter for screening for malignant neoplasm of colon: Secondary | ICD-10-CM | POA: Insufficient documentation

## 2024-07-19 DIAGNOSIS — Z96642 Presence of left artificial hip joint: Secondary | ICD-10-CM | POA: Diagnosis not present

## 2024-07-19 DIAGNOSIS — N183 Chronic kidney disease, stage 3 unspecified: Secondary | ICD-10-CM | POA: Diagnosis not present

## 2024-07-19 DIAGNOSIS — E1122 Type 2 diabetes mellitus with diabetic chronic kidney disease: Secondary | ICD-10-CM | POA: Diagnosis not present

## 2024-07-19 DIAGNOSIS — I129 Hypertensive chronic kidney disease with stage 1 through stage 4 chronic kidney disease, or unspecified chronic kidney disease: Secondary | ICD-10-CM | POA: Insufficient documentation

## 2024-07-19 DIAGNOSIS — D128 Benign neoplasm of rectum: Secondary | ICD-10-CM | POA: Diagnosis not present

## 2024-07-19 DIAGNOSIS — F32A Depression, unspecified: Secondary | ICD-10-CM | POA: Insufficient documentation

## 2024-07-19 DIAGNOSIS — K582 Mixed irritable bowel syndrome: Secondary | ICD-10-CM | POA: Diagnosis not present

## 2024-07-19 DIAGNOSIS — D124 Benign neoplasm of descending colon: Secondary | ICD-10-CM | POA: Diagnosis not present

## 2024-07-19 DIAGNOSIS — K5730 Diverticulosis of large intestine without perforation or abscess without bleeding: Secondary | ICD-10-CM | POA: Insufficient documentation

## 2024-07-19 DIAGNOSIS — Z7985 Long-term (current) use of injectable non-insulin antidiabetic drugs: Secondary | ICD-10-CM | POA: Insufficient documentation

## 2024-07-19 DIAGNOSIS — Z9049 Acquired absence of other specified parts of digestive tract: Secondary | ICD-10-CM | POA: Insufficient documentation

## 2024-07-19 DIAGNOSIS — G4730 Sleep apnea, unspecified: Secondary | ICD-10-CM | POA: Diagnosis not present

## 2024-07-19 HISTORY — PX: COLONOSCOPY: SHX5424

## 2024-07-19 HISTORY — PX: POLYPECTOMY: SHX149

## 2024-07-19 LAB — GLUCOSE, CAPILLARY: Glucose-Capillary: 99 mg/dL (ref 70–99)

## 2024-07-19 SURGERY — COLONOSCOPY
Anesthesia: General

## 2024-07-19 MED ORDER — PROPOFOL 500 MG/50ML IV EMUL
INTRAVENOUS | Status: DC | PRN
  Administered 2024-07-19: 75 ug/kg/min via INTRAVENOUS

## 2024-07-19 MED ORDER — LABETALOL HCL 5 MG/ML IV SOLN
INTRAVENOUS | Status: AC
  Filled 2024-07-19: qty 4

## 2024-07-19 MED ORDER — SODIUM CHLORIDE 0.9 % IV SOLN: INTRAVENOUS | Status: DC

## 2024-07-19 MED ORDER — LIDOCAINE HCL (PF) 2 % IJ SOLN
INTRAMUSCULAR | Status: AC
  Filled 2024-07-19: qty 5

## 2024-07-19 MED ORDER — LABETALOL HCL 5 MG/ML IV SOLN
INTRAVENOUS | Status: DC | PRN
  Administered 2024-07-19: 5 mg via INTRAVENOUS

## 2024-07-19 MED ORDER — LIDOCAINE HCL (CARDIAC) PF 100 MG/5ML IV SOSY
PREFILLED_SYRINGE | INTRAVENOUS | Status: DC | PRN
  Administered 2024-07-19: 60 mg via INTRAVENOUS

## 2024-07-19 MED ORDER — PROPOFOL 10 MG/ML IV BOLUS
INTRAVENOUS | Status: DC | PRN
  Administered 2024-07-19: 20 mg via INTRAVENOUS
  Administered 2024-07-19: 40 mg via INTRAVENOUS

## 2024-07-19 NOTE — Anesthesia Postprocedure Evaluation (Signed)
 Anesthesia Post Note  Patient: [name]  Procedure(s) Performed: COLONOSCOPY POLYPECTOMY, INTESTINE  Patient location during evaluation: PACU Anesthesia Type: General Level of consciousness: awake Pain management: satisfactory to patient Vital Signs Assessment: post-procedure vital signs reviewed and stable Respiratory status: spontaneous breathing Cardiovascular status: stable Anesthetic complications: no   No notable events documented.   Last Vitals:  Vitals:   07/19/24 1058 07/19/24 1100  BP: (!) 159/69 (!) 168/75  Pulse: 65 65  Resp: 16 15  Temp:    SpO2: 100% 100%    Last Pain:  Vitals:   07/19/24 1100  TempSrc:   PainSc: 0-No pain                 VAN STAVEREN,Kendrik Mcshan

## 2024-07-19 NOTE — Anesthesia Preprocedure Evaluation (Addendum)
 Anesthesia Evaluation  Patient identified by MRN, date of birth, ID band Patient awake    Reviewed: Allergy & Precautions, NPO status , Patient's Chart, lab work & pertinent test results  Airway Mallampati: II  TM Distance: >3 FB Neck ROM: full    Dental  (+) Teeth Intact   Pulmonary neg pulmonary ROS, sleep apnea    Pulmonary exam normal breath sounds clear to auscultation       Cardiovascular Exercise Tolerance: Poor hypertension, Pt. on medications Normal cardiovascular exam Rhythm:Regular Rate:Normal     Neuro/Psych    Depression    CVA negative neurological ROS  negative psych ROS   GI/Hepatic negative GI ROS, Neg liver ROS,GERD  ,,  Endo/Other  diabetes, Type 2, Oral Hypoglycemic Agents    Renal/GU CRFRenal disease  negative genitourinary   Musculoskeletal   Abdominal  (+) + obese  Peds negative pediatric ROS (+)  Hematology negative hematology ROS (+)   Anesthesia Other Findings Past Medical History: No date: Candida esophagitis (HCC) No date: Carpal tunnel syndrome No date: Chronic kidney disease (CKD), stage III (moderate) (HCC) No date: Diabetes mellitus     Comment:  type II No date: Dysplasia of cervix, low grade (CIN 1) No date: Dyspnea 2011: Esophageal yeast infection (HCC) No date: Family history of adverse reaction to anesthesia     Comment:  Sister - PONV No date: Family history of malignant neoplasm of gastrointestinal  tract No date: Fatty liver No date: Fibromyalgia No date: Fundic gland polyps of stomach, benign No date: Gastritis No date: GERD (gastroesophageal reflux disease) No date: Hemochromatosis No date: Herpes No date: Hypertension No date: Hypothyroidism No date: IBS (irritable bowel syndrome) No date: Neuropathy No date: Pelvic adhesions No date: Peripheral neuropathy     Comment:  lower legs No date: Psoriatic arthritis (HCC) No date: Sleep apnea     Comment:  no  CPAP used No date: Stroke [facility])     Comment:  found through MRI.  affects tongue movement. No date: Unspecified gastritis and gastroduodenitis without mention  of hemorrhage No date: Wears hearing aid in both ears  Past Surgical History: No date: APPENDECTOMY     Comment:  dx lap  No date: BACK SURGERY     Comment:  L1-L5 05/26/2023: BIOPSY     Comment:  Procedure: BIOPSY;  Surgeon: Onita Elspeth Sharper, DO;               Location: [facility] ENDOSCOPY;  Service: Gastroenterology;; 08/07/2018: CATARACT EXTRACTION W/PHACO; Right     Comment:  Procedure: CATARACT EXTRACTION PHACO AND INTRAOCULAR               LENS PLACEMENT (IOC)  RIGHT DIABETIC;  Surgeon: Myrna Adine Anes, MD;  Location: [facility];                Service: Ophthalmology;  Laterality: Right;  diabetic -               oral meds 10/09/2018: CATARACT EXTRACTION W/PHACO; Left     Comment:  Procedure: CATARACT EXTRACTION PHACO AND INTRAOCULAR               LENS PLACEMENT (IOC) LEFT IVA TOPICAL DIABETES;  Surgeon:              Myrna Adine Anes, MD;  Location: [facility];  Service: Ophthalmology;  Laterality: Left;  diabetic -               oral meds sleep apnea No date: CERVICAL FUSION     Comment:  c5-c7 05/26/2023: COLONOSCOPY WITH PROPOFOL ; N/A     Comment:  Procedure: COLONOSCOPY WITH PROPOFOL ;  Surgeon: Onita Elspeth Sharper, DO;  Location: [facility] ENDOSCOPY;  Service:               Gastroenterology;  Laterality: N/A; No date: DL LEFT SALPINGECTOMY LYSIS OF ADHESIONS, RIGHT BTL No date: EYE SURGERY No date: FOOT SURGERY No date: lap chloecystectomy No date: PELVIC LAPAROSCOPY No date: TUBAL LIGATION     Comment:  RIGHT     Reproductive/Obstetrics negative OB ROS                              Anesthesia Physical Anesthesia Plan  ASA: 3  Anesthesia Plan: General   Post-op Pain Management:    Induction: Intravenous  PONV  Risk Score and Plan: Propofol  infusion and TIVA  Airway Management Planned: Natural Airway and Nasal Cannula  Additional Equipment:   Intra-op Plan:   Post-operative Plan:   Informed Consent: I have reviewed the patients History and Physical, chart, labs and discussed the procedure including the risks, benefits and alternatives for the proposed anesthesia with the patient or authorized representative who has indicated his/her understanding and acceptance.     Dental Advisory Given  Plan Discussed with: CRNA  Anesthesia Plan Comments:          Anesthesia Quick Evaluation

## 2024-07-19 NOTE — H&P (Signed)
 Pre-Procedure H&P   Patient ID: [name] is a 78 y.o. female.  Gastroenterology Provider: Elspeth Ozell Jungling, DO  Referring Provider: Romero Antigua, PA PCP: Sherial Bail, MD  Date: 07/19/2024  HPI [name] is a 78 y.o. female who presents today for Colonoscopy for family history of colon cancer .  Patient with a history of IBS and encopresis.  She has alternating diarrhea and constipation with fecal leakage.  No melena or hematochezia.  She does have abdominal cramping that is relieved with bowel movement.  Colonoscopy performed in August 2024 demonstrated hyperplastic polyps in the setting of poor prep.  Left-sided diverticulosis.  Negative biopsies for microscopic colitis.  Normal TI.  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 2025   Past Medical History:  Diagnosis Date   Candida esophagitis (HCC)    Carpal tunnel syndrome    Chronic kidney disease (CKD), stage III (moderate) (HCC)    Diabetes mellitus    type II   Dysplasia of cervix, low grade (CIN 1)    Dyspnea    Esophageal yeast infection (HCC) 2011   Family history of adverse reaction to anesthesia    Sister - PONV   Family history of malignant neoplasm of gastrointestinal tract    Fatty liver    Fibromyalgia    Fundic gland polyps of stomach, benign    Gastritis    GERD (gastroesophageal reflux disease)    Hemochromatosis    Herpes    Hypertension    Hypothyroidism    IBS (irritable bowel syndrome)    Neuropathy    Pelvic adhesions    Peripheral neuropathy    lower legs   Psoriatic arthritis (HCC)    Sleep apnea    no CPAP used   Stroke [facility])    found through MRI.  affects tongue movement.   Unspecified gastritis and gastroduodenitis without mention of hemorrhage    Wears hearing aid in both ears     Past Surgical History:  Procedure Laterality Date    APPENDECTOMY     dx lap    BACK SURGERY     L1-L5   BIOPSY  05/26/2023   Procedure: BIOPSY;  Surgeon: Jungling Elspeth Ozell, DO;  Location: [facility] ENDOSCOPY;  Service: Gastroenterology;;   CATARACT EXTRACTION W/PHACO Right 08/07/2018   Procedure: CATARACT EXTRACTION PHACO AND INTRAOCULAR LENS PLACEMENT (IOC)  RIGHT DIABETIC;  Surgeon: Myrna Adine Anes, MD;  Location: [facility];  Service: Ophthalmology;  Laterality: Right;  diabetic - oral meds   CATARACT EXTRACTION W/PHACO Left 10/09/2018   Procedure: CATARACT EXTRACTION PHACO AND INTRAOCULAR LENS PLACEMENT (IOC) LEFT IVA TOPICAL DIABETES;  Surgeon: Myrna Adine Anes, MD;  Location: [facility];  Service: Ophthalmology;  Laterality: Left;  diabetic - oral meds sleep apnea   CERVICAL FUSION     c5-c7   COLONOSCOPY WITH PROPOFOL  N/A 05/26/2023   Procedure: COLONOSCOPY WITH PROPOFOL ;  Surgeon: Jungling Elspeth Ozell, DO;  Location: [facility] ENDOSCOPY;  Service: Gastroenterology;  Laterality: N/A;   DL LEFT SALPINGECTOMY LYSIS OF ADHESIONS, RIGHT BTL     EYE SURGERY     FOOT SURGERY     lap chloecystectomy     PELVIC LAPAROSCOPY     TUBAL LIGATION     RIGHT    Family History Mother- crc No other h/o GI disease or malignancy  Review of Systems  Constitutional:  Negative for activity  change, appetite change, chills, diaphoresis, fatigue, fever and unexpected weight change.  HENT:  Negative for trouble swallowing and voice change.   Respiratory:  Negative for shortness of breath and wheezing.   Cardiovascular:  Negative for chest pain, palpitations and leg swelling.  Gastrointestinal:  Positive for abdominal pain, constipation and diarrhea. Negative for abdominal distention, anal bleeding, blood in stool, nausea, rectal pain and vomiting.  Musculoskeletal:  Negative for arthralgias and myalgias.  Skin:  Negative for color change and pallor.  Neurological:  Negative for dizziness, syncope and weakness.   Psychiatric/Behavioral:  Negative for confusion.   All other systems reviewed and are negative.    Medications No current facility-administered medications on file prior to encounter.   Current Outpatient Medications on File Prior to Encounter  Medication Sig Dispense Refill   aspirin 81 MG tablet Take 81 mg by mouth daily.     atorvastatin (LIPITOR) 10 MG tablet Take 10 mg by mouth daily.     carvedilol (COREG) 25 MG tablet Take 25 mg by mouth.     ferrous sulfate ER 142 (45 Fe) MG TBCR tablet      furosemide (LASIX) 40 MG tablet Take 40 mg by mouth 2 (two) times daily.       levothyroxine (SYNTHROID, LEVOTHROID) 25 MCG tablet Take 25 mcg by mouth daily.       lisinopril (PRINIVIL,ZESTRIL) 20 MG tablet Take 40 mg by mouth daily.      vitamin B-12 (CYANOCOBALAMIN) 1000 MCG tablet Take 1,000 mcg by mouth daily.     acetaminophen  (TYLENOL ) 650 MG CR tablet      amLODipine (NORVASC) 5 MG tablet Take 5 mg by mouth daily. (Patient not taking: Reported on 05/26/2023)     DULoxetine (CYMBALTA) 30 MG capsule Take 30 mg by mouth 2 (two) times daily.     glimepiride (AMARYL) 4 MG tablet Take 4 mg by mouth daily before breakfast.   (Patient not taking: Reported on 05/26/2023)     ipratropium (ATROVENT) 0.06 % nasal spray Place 2 sprays into both nostrils 4 (four) times daily as needed for rhinitis.     lamoTRIgine (LAMICTAL) 200 MG tablet Take 200 mg by mouth daily.       loperamide (IMODIUM A-D) 2 MG tablet Take 2 mg by mouth.     loratadine (CLARITIN) 10 MG tablet Take 10 mg by mouth.     Melatonin 1 MG TABS Take by mouth.     metFORMIN (GLUCOPHAGE) 500 MG tablet Take by mouth daily with breakfast.  (Patient not taking: Reported on 05/26/2023)     methocarbamol (ROBAXIN) 500 MG tablet TAKE 1 TABLET (500 MG TOTAL) BY MOUTH TWICE A DAY     metoprolol tartrate (LOPRESSOR) 50 MG tablet Take 50 mg by mouth 2 (two) times daily. (Patient not taking: Reported on 05/26/2023)     NONFORMULARY OR COMPOUNDED  ITEM Estradiol  0.02 % 1ml prefilled applicator Sig: apply twice a week 24 each 4   omeprazole  (PRILOSEC) 40 MG capsule Take 1 capsule (40 mg total) by mouth daily. 90 capsule 3   ondansetron  (ZOFRAN ) 8 MG tablet Take by mouth every 8 (eight) hours as needed for nausea or vomiting.     ondansetron  (ZOFRAN -ODT) 4 MG disintegrating tablet Take 4 mg by mouth every 6 (six) hours as needed.     pregabalin (LYRICA) 150 MG capsule Take 150 mg by mouth 2 (two) times daily.      Secukinumab (COSENTYX) 150 MG/ML SOSY Inject into the  skin. (Patient not taking: Reported on 05/26/2023)     Semaglutide, 1 MG/DOSE, (OZEMPIC, 1 MG/DOSE,) 2 MG/1.5ML SOPN Inject 1 mg into the skin once a week.     traMADol  (ULTRAM ) 50 MG tablet Take 1 tablet (50 mg total) by mouth every 6 (six) hours as needed. 15 tablet 0   valACYclovir  (VALTREX ) 500 MG tablet TAKE 1 TABLET DAILY 90 tablet 4    Pertinent medications related to GI and procedure were reviewed by me with the patient prior to the procedure   Current Facility-Administered Medications:    0.9 %  sodium chloride  infusion, , Intravenous, Continuous, Onita Elspeth Sharper, DO  sodium chloride          No Known Allergies Allergies were reviewed by me prior to the procedure  Objective   Body mass index is 28.43 kg/m. Vitals:   07/19/24 0931  BP: (!) 225/79  Pulse: 69  Resp: 18  Temp: (!) 96.8 F (36 C)  TempSrc: Tympanic  SpO2: 99%  Weight: 84.8 kg  Height: 5' 8 (1.727 m)     Physical Exam Vitals and nursing note reviewed.  Constitutional:      General: She is not in acute distress.    Appearance: Normal appearance. She is not ill-appearing, toxic-appearing or diaphoretic.  HENT:     Head: Normocephalic and atraumatic.     Nose: Nose normal.     Mouth/Throat:     Mouth: Mucous membranes are moist.     Pharynx: Oropharynx is clear.  Eyes:     General: No scleral icterus.    Extraocular Movements: Extraocular movements intact.   Cardiovascular:     Rate and Rhythm: Normal rate and regular rhythm.     Heart sounds: Normal heart sounds. No murmur heard.    No friction rub. No gallop.  Pulmonary:     Effort: Pulmonary effort is normal. No respiratory distress.     Breath sounds: Normal breath sounds. No wheezing, rhonchi or rales.  Abdominal:     General: Bowel sounds are normal. There is no distension.     Palpations: Abdomen is soft.     Tenderness: There is no abdominal tenderness. There is no guarding or rebound.  Musculoskeletal:     Cervical back: Neck supple.     Right lower leg: No edema.     Left lower leg: No edema.  Skin:    General: Skin is warm and dry.     Coloration: Skin is not jaundiced or pale.  Neurological:     General: No focal deficit present.     Mental Status: She is alert and oriented to person, place, and time. Mental status is at baseline.  Psychiatric:        Mood and Affect: Mood normal.        Behavior: Behavior normal.        Thought Content: Thought content normal.        Judgment: Judgment normal.      Assessment:  [name] is a 78 y.o. female  who presents today for Colonoscopy for family history of colon cancer .  Plan:  Colonoscopy with possible intervention today  Colonoscopy with possible biopsy, control of bleeding, polypectomy, and interventions as necessary has been discussed with the patient/patient representative. Informed consent was obtained from the patient/patient representative after explaining the indication, nature, and risks of the procedure including but not limited to death, bleeding, perforation, missed neoplasm/lesions, cardiorespiratory compromise, and reaction to medications. Opportunity for  questions was given and appropriate answers were provided. Patient/patient representative has verbalized understanding is amenable to undergoing the procedure.   Elspeth Ozell Jungling, DO  [facility]astro[facility]  Portions of the record  may have been created with voice recognition software. Occasional wrong-word or 'sound-a-like' substitutions may have occurred due to the inherent limitations of voice recognition software.  Read the chart carefully and recognize, using context, where substitutions may have occurred.

## 2024-07-19 NOTE — Transfer of Care (Signed)
 Immediate Anesthesia Transfer of Care Note  Patient: [name]  Procedure(s) Performed: COLONOSCOPY POLYPECTOMY, INTESTINE  Patient Location: PACU  Anesthesia Type:General  Level of Consciousness: sedated  Airway & Oxygen Therapy: Patient Spontanous Breathing  Post-op Assessment: Report given to RN and Post -op Vital signs reviewed and stable  Post vital signs: Reviewed and stable  Last Vitals:  Vitals Value Taken Time  BP    Temp    Pulse 66 07/19/24 10:39  Resp 13 07/19/24 10:39  SpO2 97 % 07/19/24 10:39  Vitals shown include unfiled device data.  Last Pain:  Vitals:   07/19/24 0931  TempSrc: Tympanic         Complications: No notable events documented.

## 2024-07-19 NOTE — Interval H&P Note (Signed)
 History and Physical Interval Note: Preprocedure H&P from 07/19/24  was reviewed and there was no interval change after seeing and examining the patient.  Written consent was obtained from the patient after discussion of risks, benefits, and alternatives. Patient has consented to proceed with Colonoscopy with possible intervention   07/19/2024 9:54 AM  [name]  has presented today for surgery, with the diagnosis of Family history of colon cancer [Z80.0].  The various methods of treatment have been discussed with the patient and family. After consideration of risks, benefits and other options for treatment, the patient has consented to  Procedure(s) with comments: COLONOSCOPY (N/A) - DM/Ozempic as a surgical intervention.  The patient's history has been reviewed, patient examined, no change in status, stable for surgery.  I have reviewed the patient's chart and labs.  Questions were answered to the patient's satisfaction.     Elspeth Ozell Jungling

## 2024-07-19 NOTE — Op Note (Signed)
 [facility] Gastroenterology Patient Name: [name] Procedure Date: 07/19/2024 9:44 AM MRN: [id number] Account #: [id number] Date of Birth: 02/05/1946 Admit Type: Outpatient Age: 78 Room: [facility] ENDO ROOM 1 Gender: Female Note Status: Finalized Instrument Name: Colon Scope [id number] Procedure:             Colonoscopy Indications:           Screening in patient at increased risk: Family history                         of 1st-degree relative with colorectal cancer Providers:             Elspeth Ozell Jungling DO, DO Referring MD:          Lavenia Beaver, MD (Referring MD) Medicines:             Monitored Anesthesia Care Complications:         No immediate complications. Estimated blood loss:                         Minimal. Procedure:             Pre-Anesthesia Assessment:                        - Prior to the procedure, a History and Physical was                         performed, and patient medications and allergies were                         reviewed. The patient is competent. The risks and                         benefits of the procedure and the sedation options and                         risks were discussed with the patient. All questions                         were answered and informed consent was obtained.                         Patient identification and proposed procedure were                         verified by the physician, the nurse, the anesthetist                         and the technician in the endoscopy suite. Mental                         Status Examination: alert and oriented. Airway                         Examination: normal oropharyngeal airway and neck                         mobility. Respiratory Examination: clear to  auscultation. CV Examination: RRR, no murmurs, no S3                         or S4. Prophylactic Antibiotics: The patient does not                         require prophylactic antibiotics.  Prior                         Anticoagulants: The patient has taken no anticoagulant                         or antiplatelet agents. ASA Grade Assessment: III - A                         patient with severe systemic disease. After reviewing                         the risks and benefits, the patient was deemed in                         satisfactory condition to undergo the procedure. The                         anesthesia plan was to use monitored anesthesia care                         (MAC). Immediately prior to administration of                         medications, the patient was re-assessed for adequacy                         to receive sedatives. The heart rate, respiratory                         rate, oxygen saturations, blood pressure, adequacy of                         pulmonary ventilation, and response to care were                         monitored throughout the procedure. The physical                         status of the patient was re-assessed after the                         procedure.                        After obtaining informed consent, the colonoscope was                         passed under direct vision. Throughout the procedure,                         the patient's blood pressure, pulse, and oxygen  saturations were monitored continuously. The                         Colonoscope was introduced through the anus and                         advanced to the the terminal ileum, with                         identification of the appendiceal orifice and IC                         valve. The colonoscopy was performed without                         difficulty. The patient tolerated the procedure well.                         The quality of the bowel preparation was evaluated                         using the BBPS [facility] Bowel Preparation Scale) with                         scores of: Right Colon = 3, Transverse Colon = 3 and                          Left Colon = 3 (entire mucosa seen well with no                         residual staining, small fragments of stool or opaque                         liquid). The total BBPS score equals 9. The terminal                         ileum, ileocecal valve, appendiceal orifice, and                         rectum were photographed. Findings:      The perianal and digital rectal examinations were normal. Pertinent       negatives include normal sphincter tone.      The terminal ileum appeared normal. Estimated blood loss: none.      Retroflexion in the right colon was performed.      Two sessile polyps were found in the rectum and descending colon. The       polyps were 1 to 2 mm in size. These polyps were removed with a jumbo       cold forceps. Resection and retrieval were complete. Estimated blood       loss was minimal.      Multiple small-mouthed diverticula were found in the left colon.       Estimated blood loss: none.      Non-bleeding internal hemorrhoids were found during retroflexion. The       hemorrhoids were Grade I (internal hemorrhoids that do not prolapse).       Estimated blood loss: none.      The exam was  otherwise without abnormality on direct and retroflexion       views. Impression:            - The examined portion of the ileum was normal.                        - Two 1 to 2 mm polyps in the rectum and in the                         descending colon, removed with a jumbo cold forceps.                         Resected and retrieved.                        - Diverticulosis in the left colon.                        - Non-bleeding internal hemorrhoids.                        - The examination was otherwise normal on direct and                         retroflexion views. Recommendation:        - Patient has a contact number available for                         emergencies. The signs and symptoms of potential                         delayed complications were discussed  with the patient.                         Return to normal activities tomorrow. Written                         discharge instructions were provided to the patient.                        - Discharge patient to home.                        - Resume previous diet.                        - Continue present medications.                        - Await pathology results.                        - No further screening/surveillance colonoscopy given                         age. Not eligible for stool cards given family history.                        - Return to GI office as previously scheduled.                        -  Continue to hold colestipol.                        Continue fiber supplementation.                        - The findings and recommendations were discussed with                         the patient. Procedure Code(s):     --- Professional ---                        [phone], Colonoscopy, flexible; with biopsy, single or                         multiple Diagnosis Code(s):     --- Professional ---                        Z80.0, Family history of malignant neoplasm of                         digestive organs                        K64.0, First degree hemorrhoids                        D12.8, Benign neoplasm of rectum                        D12.4, Benign neoplasm of descending colon                        K57.30, Diverticulosis of large intestine without                         perforation or abscess without bleeding CPT copyright 2022 [employer]. All rights reserved. The codes documented in this report are preliminary and upon coder review may  be revised to meet current compliance requirements. Attending Participation:      I personally performed the entire procedure. Elspeth Jungling, DO Elspeth Ozell Jungling DO, DO 07/19/2024 10:43:38 AM This report has been signed electronically. Number of Addenda: 0 Note Initiated On: 07/19/2024 9:44 AM Scope Withdrawal Time: 0  hours 14 minutes 2 seconds  Total Procedure Duration: 0 hours 21 minutes 51 seconds  Estimated Blood Loss:  Estimated blood loss was minimal.      [facility]

## 2024-07-20 LAB — SURGICAL PATHOLOGY

## 2024-07-23 ENCOUNTER — Other Ambulatory Visit

## 2024-10-03 ENCOUNTER — Other Ambulatory Visit: Payer: Self-pay | Admitting: Family Medicine

## 2024-10-03 DIAGNOSIS — R2232 Localized swelling, mass and lump, left upper limb: Secondary | ICD-10-CM

## 2024-10-03 DIAGNOSIS — N6332 Unspecified lump in axillary tail of the left breast: Secondary | ICD-10-CM

## 2024-10-18 ENCOUNTER — Ambulatory Visit
Admission: RE | Admit: 2024-10-18 | Discharge: 2024-10-18 | Disposition: A | Discharge status: Home / Self Care | Source: Ambulatory Visit | Attending: Family Medicine | Admitting: Family Medicine

## 2024-10-18 ENCOUNTER — Ambulatory Visit
Admission: RE | Admit: 2024-10-18 | Discharge: 2024-10-18 | Disposition: A | Source: Ambulatory Visit | Attending: Family Medicine | Admitting: Family Medicine

## 2024-10-18 ENCOUNTER — Other Ambulatory Visit: Payer: Self-pay | Admitting: Family Medicine

## 2024-10-18 DIAGNOSIS — N6332 Unspecified lump in axillary tail of the left breast: Secondary | ICD-10-CM | POA: Insufficient documentation

## 2024-10-18 DIAGNOSIS — R2232 Localized swelling, mass and lump, left upper limb: Secondary | ICD-10-CM | POA: Insufficient documentation

## 2025-03-01 ENCOUNTER — Ambulatory Visit (INDEPENDENT_AMBULATORY_CARE_PROVIDER_SITE_OTHER): Admitting: Nurse Practitioner

## 2025-03-01 ENCOUNTER — Encounter (INDEPENDENT_AMBULATORY_CARE_PROVIDER_SITE_OTHER)
